# Patient Record
Sex: Female | Born: 1950 | Race: White | Hispanic: No | Marital: Married | Smoking: Never smoker
Health system: Southern US, Community
[De-identification: ages and names within clinical notes are randomized; demographics above are authoritative.]

## PROBLEM LIST (undated history)

## (undated) DIAGNOSIS — Z9889 Other specified postprocedural states: Secondary | ICD-10-CM

## (undated) DIAGNOSIS — N179 Acute kidney failure, unspecified: Secondary | ICD-10-CM

## (undated) DIAGNOSIS — I499 Cardiac arrhythmia, unspecified: Secondary | ICD-10-CM

## (undated) DIAGNOSIS — I4891 Unspecified atrial fibrillation: Secondary | ICD-10-CM

## (undated) DIAGNOSIS — I639 Cerebral infarction, unspecified: Secondary | ICD-10-CM

## (undated) DIAGNOSIS — I10 Essential (primary) hypertension: Secondary | ICD-10-CM

## (undated) DIAGNOSIS — Z950 Presence of cardiac pacemaker: Secondary | ICD-10-CM

## (undated) HISTORY — PX: GALLBLADDER SURGERY: SHX652

## (undated) HISTORY — PX: LEFT ATRIAL APPENDAGE OCCLUSION: EP1229

## (undated) HISTORY — PX: APPENDECTOMY: SHX54

## (undated) HISTORY — DX: Other specified postprocedural states: Z98.890

## (undated) HISTORY — PX: CATARACT EXTRACTION W/ INTRAOCULAR LENS IMPLANT: SHX1309

## (undated) HISTORY — PX: TONSILLECTOMY: SUR1361

## (undated) HISTORY — PX: OTHER SURGICAL HISTORY: SHX169

## (undated) HISTORY — DX: Acute kidney failure, unspecified: N17.9

## (undated) HISTORY — PX: CHOLECYSTECTOMY: SHX55

## (undated) HISTORY — PX: ABDOMINAL HYSTERECTOMY: SHX81

## (undated) HISTORY — PX: MITRAL VALVE ANNULOPLASTY: SHX2038

## (undated) HISTORY — PX: MAZE: SHX5063

## (undated) HISTORY — PX: EYE SURGERY: SHX253

---

## 1998-04-19 ENCOUNTER — Ambulatory Visit (HOSPITAL_BASED_OUTPATIENT_CLINIC_OR_DEPARTMENT_OTHER): Admission: RE | Admit: 1998-04-19 | Discharge: 1998-04-19 | Payer: Self-pay | Admitting: Orthopedic Surgery

## 2014-11-23 DIAGNOSIS — E785 Hyperlipidemia, unspecified: Secondary | ICD-10-CM | POA: Insufficient documentation

## 2014-11-23 DIAGNOSIS — I10 Essential (primary) hypertension: Secondary | ICD-10-CM | POA: Insufficient documentation

## 2014-11-23 HISTORY — DX: Essential (primary) hypertension: I10

## 2014-11-23 HISTORY — DX: Hyperlipidemia, unspecified: E78.5

## 2015-10-03 DIAGNOSIS — Z9889 Other specified postprocedural states: Secondary | ICD-10-CM | POA: Insufficient documentation

## 2015-10-03 DIAGNOSIS — I4892 Unspecified atrial flutter: Secondary | ICD-10-CM | POA: Insufficient documentation

## 2015-10-03 DIAGNOSIS — I480 Paroxysmal atrial fibrillation: Secondary | ICD-10-CM

## 2015-10-03 HISTORY — DX: Other specified postprocedural states: Z98.890

## 2015-10-03 HISTORY — DX: Unspecified atrial flutter: I48.92

## 2015-10-03 HISTORY — DX: Paroxysmal atrial fibrillation: I48.0

## 2016-09-24 DIAGNOSIS — I495 Sick sinus syndrome: Secondary | ICD-10-CM | POA: Insufficient documentation

## 2016-09-24 HISTORY — DX: Sick sinus syndrome: I49.5

## 2016-12-30 ENCOUNTER — Ambulatory Visit (INDEPENDENT_AMBULATORY_CARE_PROVIDER_SITE_OTHER): Payer: BC Managed Care – PPO | Admitting: Cardiology

## 2016-12-30 ENCOUNTER — Encounter: Payer: Self-pay | Admitting: Cardiology

## 2016-12-30 VITALS — BP 130/72 | HR 59 | Ht 60.0 in | Wt 169.0 lb

## 2016-12-30 DIAGNOSIS — I10 Essential (primary) hypertension: Secondary | ICD-10-CM | POA: Diagnosis not present

## 2016-12-30 DIAGNOSIS — Z9889 Other specified postprocedural states: Secondary | ICD-10-CM

## 2016-12-30 DIAGNOSIS — E785 Hyperlipidemia, unspecified: Secondary | ICD-10-CM | POA: Diagnosis not present

## 2016-12-30 DIAGNOSIS — I4892 Unspecified atrial flutter: Secondary | ICD-10-CM | POA: Diagnosis not present

## 2016-12-30 DIAGNOSIS — I480 Paroxysmal atrial fibrillation: Secondary | ICD-10-CM

## 2016-12-30 LAB — POCT INR: INR: 1.8

## 2016-12-30 MED ORDER — RIVAROXABAN 20 MG PO TABS: 20.0000 mg | ORAL_TABLET | Freq: Every day | ORAL | 2 refills | Status: DC

## 2016-12-30 NOTE — Patient Instructions (Signed)
Medication Instructions:   Your physician has recommended you make the following change in your medication:   STOP: Coumadin  START: Xarelto 20 mg once daily with food    Labwork:  Your physician recommends that you return for lab work in: Today   Testing/Procedures:  NONE  Follow-Up:  Your physician recommends that you schedule a follow-up appointment in: 6 months    Any Other Special Instructions Will Be Listed Below (If Applicable).     If you need a refill on your cardiac medications before your next appointment, please call your pharmacy.  '

## 2016-12-30 NOTE — Addendum Note (Signed)
Addended by: Domingo MadeiraMCCLEES, Rayshaun Needle N on: 12/30/2016 11:25 AM   Modules accepted: Orders

## 2016-12-30 NOTE — Addendum Note (Signed)
Addended by: Domingo MadeiraMCCLEES, RAVEN N on: 12/30/2016 11:45 AM   Modules accepted: Orders

## 2016-12-30 NOTE — Progress Notes (Signed)
Cardiology Office Note:    Date:  12/30/2016   ID:  Isabel Hardy, DOB [birth year], MRN [id number]  PCP:  Isabel Pearson, MD  Cardiologist:  Garwin Brothers, MD   Referring MD: Isabel Pearson, MD    ASSESSMENT:    1. Essential hypertension   2. Paroxysmal atrial fibrillation (HCC)   3. Paroxysmal atrial flutter (HCC)   4. Dyslipidemia   5. S/P MVR (mitral valve repair)    PLAN:    In order of problems listed above:  1. The patient has history of atrial flutter and has undergone ablation. She is on anticoagulation. I'm not surewhether she has an element of atrial fibrillation.She is followed by electrophysiologist for this. If she has isolated atrial flutter I asked her to talk to her electrophysiologist about going off from anticoagulation and taking full-strength aspirin. She will check this with her electrophysiologist in the next visit.I discussed with the patient atrial fibrillation, disease process. Management and therapy including rate and rhythm control, anticoagulation benefits and potential risks were discussed extensively with the patient. Patient had multiple questions which were answered to patient's satisfaction. 2. In the interim she wants to change medication to the new anticoagulants. We will check her pro time today and accordingly switch her to one of the newer anticoagulants. Benefits and potential this explained and she verbalized understanding. 3. Her blood pressure stable. Diet was discussed for obesity and dyslipidemia and she verbalized understanding. We will check her blood work today. Importance of regular exercise stressed. She'll be seen in follow-up appointment in 6 months or earlier if she has any concerns. 4. Her mitral valve replacement issues are stable.   Medication Adjustments/Labs and Tests Ordered: Current medicines are reviewed at length with the patient today.  Concerns regarding medicines are outlined above.  No orders of the defined types  were placed in this encounter.  No orders of the defined types were placed in this encounter.    History of Present Illness:    Isabel Hardy is a 66 y.o. female who is being seen today for the evaluation of atrial flutter and mitral valve repair at the request of Isabel Pearson, MD. Patient is a pleasant 66 year old female.she has been seen by me in the previous practice and wants her care to be transferred here. She denies any chest pain orthopnea or PND. He leads a sedentary lifestyle. At the time of my evaluation she is alert awake oriented and in no distress. She denies any history of palpitations. She is on anticoagulation with warfarinand wants to explore the newer medications for anticoagulation  Past Medical History:  Diagnosis Date  . H/O mitral valve repair     History reviewed. No pertinent surgical history.  Current Medications: Current Meds  Medication Sig  . ALPRAZolam (XANAX) 0.25 MG tablet Take 0.25 mg by mouth daily as needed.  Marland Kitchen atorvastatin (LIPITOR) 40 MG tablet Take 40 mg by mouth daily.  Marland Kitchen CALCIUM-VITAMIN D PO Take 1 capsule by mouth daily.  . Cholecalciferol (VITAMIN D) 2000 units tablet Take 2,000 Units by mouth daily.  . furosemide (LASIX) 20 MG tablet Take 20 mg by mouth daily.  . magnesium oxide (MAG-OX) 400 MG tablet Take 400 mg by mouth 2 (two) times daily.  . potassium chloride SA (K-DUR,KLOR-CON) 20 MEQ tablet Take 20 mEq by mouth daily.  . traMADol (ULTRAM) 50 MG tablet Take 50 mg by mouth daily as needed.  . valsartan-hydrochlorothiazide (DIOVAN-HCT) 320-25 MG tablet Take 1 tablet by mouth  daily.  . warfarin (COUMADIN) 1 MG tablet Take 3 mg by mouth daily.     Allergies:   Patient has no known allergies.   Social History   Social History  . Marital status: Married    Spouse name: N/A  . Number of children: N/A  . Years of education: N/A   Social History Main Topics  . Smoking status: Never Smoker  . Smokeless tobacco: Never Used  .  Alcohol use No  . Drug use: No  . Sexual activity: Not Asked   Other Topics Concern  . None   Social History Narrative  . None     Family History: The patient's family history includes Heart attack in her father.  ROS:   Please see the history of present illness.    All other systems reviewed and are negative.  EKGs/Labs/Other Studies Reviewed:    The following studies were reviewed today: I reviewed previous reports and discussed with the patient extensively my findings. Questions were answered to her satisfaction.   Recent Labs: No results found for requested labs within last 8760 hours.  Recent Lipid Panel No results found for: CHOL, TRIG, HDL, CHOLHDL, VLDL, LDLCALC, LDLDIRECT  Physical Exam:    VS:  BP 130/72   Pulse (!) 59   Ht 5' (1.524 m)   Wt 169 lb (76.7 kg)   SpO2 99%   BMI 33.01 kg/m     Wt Readings from Last 3 Encounters:  12/30/16 169 lb (76.7 kg)     GEN: Patient is in no acute distress HEENT: Normal NECK: No JVD; No carotid bruits LYMPHATICS: No lymphadenopathy CARDIAC: S1 S2 regular, 2/6 systolic murmur at the apex. RESPIRATORY:  Clear to auscultation without rales, wheezing or rhonchi  ABDOMEN: Soft, non-tender, non-distended MUSCULOSKELETAL:  No edema; No deformity  SKIN: Warm and dry NEUROLOGIC:  Alert and oriented x 3 PSYCHIATRIC:  Normal affect    Signed, Garwin Brothersajan R Calisha Tindel, MD  12/30/2016 11:00 AM    [facility]

## 2016-12-31 LAB — CBC WITH DIFFERENTIAL/PLATELET
BASOS ABS: 0 10*3/uL (ref 0.0–0.2)
Basos: 0 %
EOS (ABSOLUTE): 0.2 10*3/uL (ref 0.0–0.4)
EOS: 3 %
HEMATOCRIT: 43.5 % (ref 34.0–46.6)
Hemoglobin: 14.6 g/dL (ref 11.1–15.9)
Immature Grans (Abs): 0 10*3/uL (ref 0.0–0.1)
Immature Granulocytes: 0 %
LYMPHS ABS: 1.7 10*3/uL (ref 0.7–3.1)
Lymphs: 23 %
MCH: 30.9 pg (ref 26.6–33.0)
MCHC: 33.6 g/dL (ref 31.5–35.7)
MCV: 92 fL (ref 79–97)
MONOCYTES: 6 %
MONOS ABS: 0.4 10*3/uL (ref 0.1–0.9)
NEUTROS PCT: 68 %
Neutrophils Absolute: 5 10*3/uL (ref 1.4–7.0)
Platelets: 234 10*3/uL (ref 150–379)
RBC: 4.72 x10E6/uL (ref 3.77–5.28)
RDW: 14.4 % (ref 12.3–15.4)
WBC: 7.3 10*3/uL (ref 3.4–10.8)

## 2016-12-31 LAB — BASIC METABOLIC PANEL
BUN / CREAT RATIO: 15 (ref 12–28)
BUN: 21 mg/dL (ref 8–27)
CO2: 24 mmol/L (ref 20–29)
CREATININE: 1.36 mg/dL — AB (ref 0.57–1.00)
Calcium: 10.1 mg/dL (ref 8.7–10.3)
Chloride: 102 mmol/L (ref 96–106)
GFR, EST AFRICAN AMERICAN: 47 mL/min/{1.73_m2} — AB (ref >59)
GFR, EST NON AFRICAN AMERICAN: 41 mL/min/{1.73_m2} — AB (ref >59)
Glucose: 89 mg/dL (ref 65–99)
Potassium: 4 mmol/L (ref 3.5–5.2)
SODIUM: 148 mmol/L — AB (ref 134–144)

## 2016-12-31 LAB — HEPATIC FUNCTION PANEL
ALBUMIN: 4.8 g/dL (ref 3.6–4.8)
ALK PHOS: 78 IU/L (ref 39–117)
ALT: 22 IU/L (ref 0–32)
AST: 24 IU/L (ref 0–40)
Bilirubin Total: 1.3 mg/dL — ABNORMAL HIGH (ref 0.0–1.2)
Bilirubin, Direct: 0.35 mg/dL (ref 0.00–0.40)
Total Protein: 6.6 g/dL (ref 6.0–8.5)

## 2016-12-31 LAB — LIPID PANEL
CHOL/HDL RATIO: 2.7 ratio (ref 0.0–4.4)
CHOLESTEROL TOTAL: 151 mg/dL (ref 100–199)
HDL: 55 mg/dL (ref >39)
LDL Calculated: 76 mg/dL (ref 0–99)
TRIGLYCERIDES: 100 mg/dL (ref 0–149)
VLDL Cholesterol Cal: 20 mg/dL (ref 5–40)

## 2017-10-13 ENCOUNTER — Encounter: Payer: Self-pay | Admitting: Cardiology

## 2017-10-13 ENCOUNTER — Ambulatory Visit (INDEPENDENT_AMBULATORY_CARE_PROVIDER_SITE_OTHER): Payer: Medicare Other | Admitting: Cardiology

## 2017-10-13 VITALS — BP 124/68 | HR 57 | Ht 60.0 in | Wt 170.1 lb

## 2017-10-13 DIAGNOSIS — I10 Essential (primary) hypertension: Secondary | ICD-10-CM

## 2017-10-13 DIAGNOSIS — I4892 Unspecified atrial flutter: Secondary | ICD-10-CM | POA: Diagnosis not present

## 2017-10-13 DIAGNOSIS — I480 Paroxysmal atrial fibrillation: Secondary | ICD-10-CM

## 2017-10-13 DIAGNOSIS — E785 Hyperlipidemia, unspecified: Secondary | ICD-10-CM

## 2017-10-13 DIAGNOSIS — Z9889 Other specified postprocedural states: Secondary | ICD-10-CM | POA: Diagnosis not present

## 2017-10-13 NOTE — Progress Notes (Signed)
Cardiology Office Note:    Date:  10/13/2017   ID:  Isabel Hardy, DOB 12/23/1950, MRN [id number]  PCP:  Buckner MaltaBurgart, Jennifer, MD  Cardiologist:  Garwin Brothersajan R Revankar, MD   Referring MD: Marylynn PearsonBurkhart, Brian, MD    ASSESSMENT:    1. Essential hypertension   2. Paroxysmal atrial fibrillation (HCC)   3. Paroxysmal atrial flutter (HCC)   4. Dyslipidemia   5. S/P MVR (mitral valve repair)    PLAN:    In order of problems listed above:  1. Primary prevention stressed with the patient.  Importance of compliance with diet and medication stressed and she vocalized understanding. 2. I discussed with the patient atrial fibrillation, disease process. Management and therapy including rate and rhythm control, anticoagulation benefits and potential risks were discussed extensively with the patient. Patient had multiple questions which were answered to patient's satisfaction. 3. The patient is going to consider switching from warfarin to the newer anticoagulants.  She will let me know about this.  She is coming for an echocardiogram in the next few days.  EKG done today reveals sinus rhythm and nonspecific ST-T changes. 4. In view of her palpitations I will do a one-month event monitor.  She will be seen in follow-up appointment in 6 months or earlier if she has any concerns.  [facility] month of August.   Medication Adjustments/Labs and Tests Ordered: Current medicines are reviewed at length with the patient today.  Concerns regarding medicines are outlined above.  Orders Placed This Encounter  Procedures  . CARDIAC EVENT MONITOR  . EKG 12-Lead  . ECHOCARDIOGRAM COMPLETE   No orders of the defined types were placed in this encounter.    No chief complaint on file.    History of Present Illness:    Isabel Hardy is a 67 y.o. female.  She has past medical history of mitral valve repair which has been successful by follow-up  echocardiograms.  She also has history of paroxysmal atrial fibrillation for which reason she is on anticoagulation.  She denies any problems at this time and takes care of activities of daily living.  No chest pain orthopnea or PND.  She mentions to me that she uses an exercise bicycle without any symptoms.  She tells me that she saw her primary care doctor who felt she was in atrial fibrillation.  She does not remember having EKG be done.  Subsequently she says in the next visit her physician felt that she was in sinus rhythm and this was confirmed by the EKG.  Since then she has had no issues of palpitations.  At the time of my evaluation, the patient is alert awake oriented and in no distress.  Past Medical History:  Diagnosis Date  . H/O mitral valve repair     Past Surgical History:  Procedure Laterality Date  . ABDOMINAL HYSTERECTOMY    . APPENDECTOMY    . CESAREAN SECTION    . GALLBLADDER SURGERY    . open heart surgery      Current Medications: Current Meds  Medication Sig  . alendronate (FOSAMAX) 35 MG tablet Take 35 mg by mouth every 7 (seven) days. Take with a full glass of water on an empty stomach.  . ALPRAZolam (XANAX) 0.25 MG tablet Take 0.25 mg by mouth daily as needed.  Marland Kitchen. atorvastatin (LIPITOR) 40 MG tablet Take 40 mg by mouth daily.  Marland Kitchen. CALCIUM-VITAMIN D PO Take 1  capsule by mouth daily.  . Cholecalciferol (VITAMIN D) 2000 units tablet Take 2,000 Units by mouth daily.  . furosemide (LASIX) 20 MG tablet Take 20 mg by mouth daily.  . hydrochlorothiazide (HYDRODIURIL) 12.5 MG tablet Take 12.5 mg by mouth daily.  . magnesium oxide (MAG-OX) 400 MG tablet Take 400 mg by mouth 2 (two) times daily.  . potassium chloride SA (K-DUR,KLOR-CON) 20 MEQ tablet Take 20 mEq by mouth daily.  . risedronate (ACTONEL) 5 MG tablet Take 5 mg by mouth daily before breakfast. with water on empty stomach, nothing by mouth or lie down for next 30 minutes.  Marland Kitchen telmisartan-hydrochlorothiazide  (MICARDIS HCT) 40-12.5 MG tablet Take 1 tablet by mouth daily.  . traMADol (ULTRAM) 50 MG tablet Take 50 mg by mouth daily as needed.  . warfarin (COUMADIN) 3 MG tablet Take 3 mg by mouth every Monday, Wednesday, and Friday.     Allergies:   Patient has no known allergies.   Social History   Socioeconomic History  . Marital status: Married    Spouse name: Not on file  . Number of children: Not on file  . Years of education: Not on file  . Highest education level: Not on file  Occupational History  . Not on file  Social Needs  . Financial resource strain: Not on file  . Food insecurity:    Worry: Not on file    Inability: Not on file  . Transportation needs:    Medical: Not on file    Non-medical: Not on file  Tobacco Use  . Smoking status: Never Smoker  . Smokeless tobacco: Never Used  Substance and Sexual Activity  . Alcohol use: No  . Drug use: No  . Sexual activity: Not on file  Lifestyle  . Physical activity:    Days per week: Not on file    Minutes per session: Not on file  . Stress: Not on file  Relationships  . Social connections:    Talks on phone: Not on file    Gets together: Not on file    Attends religious service: Not on file    Active member of club or organization: Not on file    Attends meetings of clubs or organizations: Not on file    Relationship status: Not on file  Other Topics Concern  . Not on file  Social History Narrative  . Not on file     Family History: The patient's family history includes Heart attack in her father; Hypertension in her mother; Kidney disease in her mother.  ROS:   Please see the history of present illness.    All other systems reviewed and are negative.  EKGs/Labs/Other Studies Reviewed:    The following studies were reviewed today: I discussed my findings with the patient at extensive length.  EKG reveals sinus bradycardia and nonspecific ST-T changes.  Recent Labs: 12/30/2016: ALT 22; BUN 21; Creatinine,  Ser 1.36; Hemoglobin 14.6; Platelets 234; Potassium 4.0; Sodium 148  Recent Lipid Panel    Component Value Date/Time   CHOL 151 12/30/2016 1155   TRIG 100 12/30/2016 1155   HDL 55 12/30/2016 1155   CHOLHDL 2.7 12/30/2016 1155   LDLCALC 76 12/30/2016 1155    Physical Exam:    VS:  BP 124/68 (BP Location: Left Arm, Patient Position: Sitting, Cuff Size: Normal)   Pulse (!) 57   Ht 5' (1.524 m)   Wt 170 lb 1.9 oz (77.2 kg)   SpO2 98%  BMI 33.22 kg/m     Wt Readings from Last 3 Encounters:  10/13/17 170 lb 1.9 oz (77.2 kg)  12/30/16 169 lb (76.7 kg)     GEN: Patient is in no acute distress HEENT: Normal NECK: No JVD; No carotid bruits LYMPHATICS: No lymphadenopathy CARDIAC: Hear sounds regular, 2/6 systolic murmur at the apex. RESPIRATORY:  Clear to auscultation without rales, wheezing or rhonchi  ABDOMEN: Soft, non-tender, non-distended MUSCULOSKELETAL:  No edema; No deformity  SKIN: Warm and dry NEUROLOGIC:  Alert and oriented x 3 PSYCHIATRIC:  Normal affect   Signed, Garwin Brothers, MD  10/13/2017 10:05 AM    [facility]

## 2017-10-13 NOTE — Patient Instructions (Signed)
Medication Instructions:  Your physician recommends that you continue on your current medications as directed. Please refer to the Current Medication list given to you today.  Labwork: None  Testing/Procedures: Your physician has requested that you have an echocardiogram. Echocardiography is a painless test that uses sound waves to create images of your heart. It provides your doctor with information about the size and shape of your heart and how well your heart's chambers and valves are working. This procedure takes approximately one hour. There are no restrictions for this procedure.  Follow-Up: Your physician recommends that you schedule a follow-up appointment in: 6 months  Any Other Special Instructions Will Be Listed Below (If Applicable).     If you need a refill on your cardiac medications before your next appointment, please call your pharmacy.   [facility]  Kedar Sedano A, RN, BSN  

## 2017-10-30 ENCOUNTER — Encounter: Payer: Self-pay | Admitting: *Deleted

## 2017-10-30 ENCOUNTER — Other Ambulatory Visit: Payer: Self-pay

## 2017-10-30 ENCOUNTER — Ambulatory Visit (INDEPENDENT_AMBULATORY_CARE_PROVIDER_SITE_OTHER): Payer: Medicare Other

## 2017-10-30 DIAGNOSIS — Z9889 Other specified postprocedural states: Secondary | ICD-10-CM | POA: Diagnosis not present

## 2017-10-30 DIAGNOSIS — I361 Nonrheumatic tricuspid (valve) insufficiency: Secondary | ICD-10-CM | POA: Diagnosis not present

## 2017-10-30 DIAGNOSIS — Z8679 Personal history of other diseases of the circulatory system: Secondary | ICD-10-CM | POA: Insufficient documentation

## 2017-10-30 HISTORY — DX: Personal history of other diseases of the circulatory system: Z86.79

## 2017-10-30 NOTE — Progress Notes (Signed)
Complete echocardiogram has been performed.  Jimmy Jailen Lung RDCS 

## 2017-11-04 ENCOUNTER — Ambulatory Visit (INDEPENDENT_AMBULATORY_CARE_PROVIDER_SITE_OTHER): Payer: Medicare Other

## 2017-11-04 DIAGNOSIS — I480 Paroxysmal atrial fibrillation: Secondary | ICD-10-CM | POA: Diagnosis not present

## 2017-12-02 ENCOUNTER — Telehealth: Payer: Self-pay | Admitting: Cardiology

## 2017-12-02 NOTE — Telephone Encounter (Signed)
Called PCP office with no answer. Dr. Tomie Chinaevankar is out of the office until next week; will ask his preference once I have more detail from PCP.

## 2017-12-02 NOTE — Telephone Encounter (Signed)
Wants to know if RRR would prefer pt be on eliquis or xarelto

## 2017-12-08 ENCOUNTER — Other Ambulatory Visit: Payer: Self-pay

## 2017-12-08 NOTE — Telephone Encounter (Signed)
Letter has been faxed to PCP and voicemail was left with PCP informing of Dr. Kem Parkinsonevankar's recommendation.

## 2017-12-08 NOTE — Telephone Encounter (Signed)
Followed up with patient, per the patient she was instructed to ask our office regarding which medication should be chosen as the PCP did not feel confident in choosing. Please advise so that I may call the PCP office.

## 2017-12-09 ENCOUNTER — Telehealth: Payer: Self-pay | Admitting: *Deleted

## 2017-12-09 NOTE — Telephone Encounter (Signed)
Faxed final Monitor results to Dr. Excell SeltzerBaker, fax #[phone]. Contact person is [place] [phone].

## 2018-09-06 ENCOUNTER — Telehealth: Payer: Self-pay | Admitting: *Deleted

## 2018-09-06 NOTE — Telephone Encounter (Signed)
error 

## 2018-09-07 ENCOUNTER — Telehealth: Payer: Self-pay

## 2018-09-07 NOTE — Telephone Encounter (Signed)
Isabel Hardy faxed message about patient needing to have a CT Myelogram Lumbar Spine. They have to inject her with dye prior to the CT. Ortho want to know if you are okay with Isabel Hardy holding her Eliquis 48 hours prior to having this done. The message does not mention that she has been set up with a date for this yet. Please advise.

## 2018-09-07 NOTE — Telephone Encounter (Signed)
Needs tele appt with me. More than 44mo past appt!!

## 2018-09-07 NOTE — Telephone Encounter (Signed)
RN called patient and received phone consent for telemedicine visit. Patient instructed to have home BP cuff available, weigh herself and all medication out for reconciliation/refills. Patient scheduled for virtual visit and had no further questions. Scheduled for 09/09/18.

## 2018-09-08 ENCOUNTER — Telehealth: Payer: Self-pay | Admitting: Cardiology

## 2018-09-08 NOTE — Telephone Encounter (Signed)
Virtual Visit Pre-Appointment Phone Call  "(Name), I am calling you today to discuss your upcoming appointment. We are currently trying to limit exposure to the virus that causes COVID-19 by seeing patients at home rather than in the office."  1. "What is the BEST phone number to call the day of the visit?" - include this in appointment notes  2. Do you have or have access to (through a family member/friend) a smartphone with video capability that we can use for your visit?" a. If yes - list this number in appt notes as cell (if different from BEST phone #) and list the appointment type as a VIDEO visit in appointment notes b. If no - list the appointment type as a PHONE visit in appointment notes  Confirm consent - "In the setting of the current Covid19 crisis, you are scheduled for a (phone or video) visit with your provider on (date) at (time).  Just as we do with many in-office visits, in order for you to participate in this visit, we must obtain consent.  If you'd like, I can send this to your mychart (if signed up) or email for you to review.  Otherwise, I can obtain your verbal consent now.  All virtual visits are billed to your insurance company just like a normal visit would be.  By agreeing to a virtual visit, we'd like you to understand that the technology does not allow for your provider to perform an examination, and thus may limit your provider's ability to fully assess your condition. If your provider identifies any concerns that need to be evaluated in person, we will make arrangements to do so.  Finally, though the technology is pretty good, we cannot assure that it will always work on either your or our end, and in the setting of a video visit, we may have to convert it to a phone-only visit.  In either situation, we cannot ensure that we have a secure connection.  Are you willing to proceed?" STAFF: Did the patient verbally acknowledge consent to telehealth visit? Document  YES/NO here: YES 3. Advise patient to be prepared - "Two hours prior to your appointment, go ahead and check your blood pressure, pulse, oxygen saturation, and your weight (if you have the equipment to check those) and write them all down. When your visit starts, your provider will ask you for this information. If you have an Apple Watch or Kardia device, please plan to have heart rate information ready on the day of your appointment. Please have a pen and paper handy nearby the day of the visit as well."  4. Give patient instructions for MyChart download to smartphone OR Doximity/Doxy.me as below if video visit (depending on what platform provider is using)  5. Inform patient they will receive a phone call 15 minutes prior to their appointment time (may be from unknown caller ID) so they should be prepared to answer    TELEPHONE CALL NOTE  Isabel Hardy has been deemed a candidate for a follow-up tele-health visit to limit community exposure during the Covid-19 pandemic. I spoke with the patient via phone to ensure availability of phone/video source, confirm preferred email & phone number, and discuss instructions and expectations.  I reminded Isabel Hardy to be prepared with any vital sign and/or heart rhythm information that could potentially be obtained via home monitoring, at the time of her visit. I reminded Isabel Hardy to expect a phone call prior to her visit.  Minette Brine 09/08/2018 2:15 PM   INSTRUCTIONS FOR DOWNLOADING THE MYCHART APP TO SMARTPHONE  - The patient must first make sure to have activated MyChart and know their login information - If Apple, go to [employer] and type in MyChart in the search bar and download the app. If Android, ask patient to go to [employer] and type in [place] in the search bar and download the app. The app is free but as with any other app downloads, their phone may require them to verify saved payment information or Apple/Android  password.  - The patient will need to then log into the app with their MyChart username and password, and select  as their healthcare provider to link the account. When it is time for your visit, go to the MyChart app, find appointments, and click Begin Video Visit. Be sure to Select Allow for your device to access the Microphone and Camera for your visit. You will then be connected, and your provider will be with you shortly.  **If they have any issues connecting, or need assistance please contact MyChart service desk [phone])**  **If using a computer, in order to ensure the best quality for their visit they will need to use either of the following Internet Browsers: [employer] or Google Chrome**  IF USING DOXIMITY or DOXY.ME - The patient will receive a link just prior to their visit by text.     FULL LENGTH CONSENT FOR TELE-HEALTH VISIT   I hereby voluntarily request, consent and authorize [facility] and its employed or contracted physicians, physician assistants, nurse practitioners or other licensed health care professionals (the Practitioner), to provide me with telemedicine health care services (the Services") as deemed necessary by the treating Practitioner. I acknowledge and consent to receive the Services by the Practitioner via telemedicine. I understand that the telemedicine visit will involve communicating with the Practitioner through live audiovisual communication technology and the disclosure of certain medical information by electronic transmission. I acknowledge that I have been given the opportunity to request an in-person assessment or other available alternative prior to the telemedicine visit and am voluntarily participating in the telemedicine visit.  I understand that I have the right to withhold or withdraw my consent to the use of telemedicine in the course of my care at any time, without affecting my right to future care or treatment,  and that the Practitioner or I may terminate the telemedicine visit at any time. I understand that I have the right to inspect all information obtained and/or recorded in the course of the telemedicine visit and may receive copies of available information for a reasonable fee.  I understand that some of the potential risks of receiving the Services via telemedicine include:   Delay or interruption in medical evaluation due to technological equipment failure or disruption;  Information transmitted may not be sufficient (e.g. poor resolution of images) to allow for appropriate medical decision making by the Practitioner; and/or   In rare instances, security protocols could fail, causing a breach of personal health information.  Furthermore, I acknowledge that it is my responsibility to provide information about my medical history, conditions and care that is complete and accurate to the best of my ability. I acknowledge that Practitioner's advice, recommendations, and/or decision may be based on factors not within their control, such as incomplete or inaccurate data provided by me or distortions of diagnostic images or specimens that may result from electronic transmissions. I understand that the  practice of medicine is not an Chief Strategy Officer and that Practitioner makes no warranties or guarantees regarding treatment outcomes. I acknowledge that I will receive a copy of this consent concurrently upon execution via email to the email address I last provided but may also request a printed copy by calling the office of [place].    I understand that my insurance will be billed for this visit.   I have read or had this consent read to me.  I understand the contents of this consent, which adequately explains the benefits and risks of the Services being provided via telemedicine.   I have been provided ample opportunity to ask questions regarding this consent and the Services and have had my questions  answered to my satisfaction.  I give my informed consent for the services to be provided through the use of telemedicine in my medical care  By participating in this telemedicine visit I agree to the above.

## 2018-09-09 ENCOUNTER — Encounter: Payer: Self-pay | Admitting: Cardiology

## 2018-09-09 ENCOUNTER — Telehealth (INDEPENDENT_AMBULATORY_CARE_PROVIDER_SITE_OTHER): Payer: Medicare Other | Admitting: Cardiology

## 2018-09-09 ENCOUNTER — Other Ambulatory Visit: Payer: Self-pay

## 2018-09-09 VITALS — BP 135/81 | HR 61 | Ht 60.0 in | Wt 183.0 lb

## 2018-09-09 DIAGNOSIS — Z9889 Other specified postprocedural states: Secondary | ICD-10-CM

## 2018-09-09 DIAGNOSIS — I480 Paroxysmal atrial fibrillation: Secondary | ICD-10-CM

## 2018-09-09 DIAGNOSIS — I4892 Unspecified atrial flutter: Secondary | ICD-10-CM

## 2018-09-09 DIAGNOSIS — E785 Hyperlipidemia, unspecified: Secondary | ICD-10-CM

## 2018-09-09 DIAGNOSIS — I10 Essential (primary) hypertension: Secondary | ICD-10-CM

## 2018-09-09 NOTE — Patient Instructions (Addendum)
Per patient request and Dr.Revankar, a copy of today's visit note was sent to Dr. Garnette Gunner for review.     Medication Instructions:  Your physician recommends that you continue on your current medications as directed. Please refer to the Current Medication list given to you today.  If you need a refill on your cardiac medications before your next appointment, please call your pharmacy.   Lab work: NONE If you have labs (blood work) drawn today and your tests are completely normal, you will receive your results only by: Marland Kitchen MyChart Message (if you have MyChart) OR . A paper copy in the mail If you have any lab test that is abnormal or we need to change your treatment, we will call you to review the results.  Testing/Procedures: NONE  Follow-Up: At [facility], you and your health needs are our priority.  As part of our continuing mission to provide you with exceptional heart care, we have created designated Provider Care Teams.  These Care Teams include your primary Cardiologist (physician) and Advanced Practice Providers (APPs -  Physician Assistants and Nurse Practitioners) who all work together to provide you with the care you need, when you need it. You will need a follow up appointment in 4 months.

## 2018-09-09 NOTE — Progress Notes (Signed)
Virtual Visit via Video Note   This visit type was conducted due to national recommendations for restrictions regarding the COVID-19 Pandemic (e.g. social distancing) in an effort to limit this patient's exposure and mitigate transmission in our community.  Due to her co-morbid illnesses, this patient is at least at moderate risk for complications without adequate follow up.  This format is felt to be most appropriate for this patient at this time.  All issues noted in this document were discussed and addressed.  A limited physical exam was performed with this format.  Please refer to the patient's chart for her consent to telehealth for [facility].   Date:  09/09/2018   ID:  Isabel Hardy, DOB [birth year], MRN [id number]  Patient Location: Home Provider Location: Home  PCP:  Buckner Malta, MD  Cardiologist:  No primary care provider on file.  Electrophysiologist:  None   Evaluation Performed:  Follow-Up Visit  Chief Complaint: Atrial fibrillation  History of Present Illness:    Isabel Hardy is a 68 y.o. female with past medical history of atrial fibrillation and she also has a pacemaker.  She is now planning to undergo a myelogram it appears like.  She is considering getting this done and also has questions about anticoagulation.  No chest pain orthopnea or PND.  She takes care of activities of daily living.  Her daughter is with her during this visit on the phone by video conference.  At the time of my evaluation, the patient is alert awake oriented and in no distress.  The patient does not have symptoms concerning for COVID-19 infection (fever, chills, cough, or new shortness of breath).    Past Medical History:  Diagnosis Date  . H/O mitral valve repair    Past Surgical History:  Procedure Laterality Date  . ABDOMINAL HYSTERECTOMY    . APPENDECTOMY    . CESAREAN SECTION    . GALLBLADDER SURGERY    . open heart surgery       Current Meds  Medication Sig   . ALPRAZolam (XANAX) 0.25 MG tablet Take 0.25 mg by mouth daily as needed.  Marland Kitchen apixaban (ELIQUIS) 5 MG TABS tablet Take 1 tablet by mouth 2 (two) times a day.  Marland Kitchen atorvastatin (LIPITOR) 40 MG tablet Take 40 mg by mouth daily.  Marland Kitchen CALCIUM-VITAMIN D PO Take 1 capsule by mouth daily.  . Cholecalciferol (VITAMIN D) 2000 units tablet Take 2,000 Units by mouth daily.  . furosemide (LASIX) 20 MG tablet Take 20 mg by mouth daily.  Marland Kitchen gabapentin (NEURONTIN) 300 MG capsule Take 300 mg by mouth 3 (three) times daily.  . hydrochlorothiazide (HYDRODIURIL) 12.5 MG tablet Take 12.5 mg by mouth daily.  . Magnesium Oxide 250 MG TABS Take 250 mg by mouth 2 (two) times daily.   . metoprolol tartrate (LOPRESSOR) 25 MG tablet Take 1 tablet by mouth 2 (two) times a day.  . potassium chloride SA (K-DUR,KLOR-CON) 20 MEQ tablet Take 20 mEq by mouth 2 (two) times daily.   . risedronate (ACTONEL) 5 MG tablet Take 5 mg by mouth daily before breakfast. with water on empty stomach, nothing by mouth or lie down for next 30 minutes.  Marland Kitchen telmisartan-hydrochlorothiazide (MICARDIS HCT) 40-12.5 MG tablet Take 1 tablet by mouth daily.  . traZODone (DESYREL) 50 MG tablet Take 1 tablet by mouth at bedtime.     Allergies:   Patient has no known allergies.   Social History   Tobacco Use  . Smoking status:  Never Smoker  . Smokeless tobacco: Never Used  Substance Use Topics  . Alcohol use: No  . Drug use: No     Family Hx: The patient's family history includes Heart attack in her father; Hypertension in her mother; Kidney disease in her mother.  ROS:   Please see the history of present illness.    As mentioned above All other systems reviewed and are negative.   Prior CV studies:   The following studies were reviewed today:  Results of the event monitor and echocardiogram were discussed with the patient.  With the patient when I call  Labs/Other Tests and Data Reviewed:    EKG:  I reviewed the telemetry report and the  event monitor done in the past with the patient at length  Recent Labs: No results found for requested labs within last 8760 hours.   Recent Lipid Panel Lab Results  Component Value Date/Time   CHOL 151 12/30/2016 11:55 AM   TRIG 100 12/30/2016 11:55 AM   HDL 55 12/30/2016 11:55 AM   CHOLHDL 2.7 12/30/2016 11:55 AM   LDLCALC 76 12/30/2016 11:55 AM    Wt Readings from Last 3 Encounters:  09/09/18 183 lb (83 kg)  10/13/17 170 lb 1.9 oz (77.2 kg)  12/30/16 169 lb (76.7 kg)     Objective:    Vital Signs:  BP 135/81 (BP Location: Left Arm, Patient Position: Sitting, Cuff Size: Normal)   Pulse 61   Ht 5' (1.524 m)   Wt 183 lb (83 kg)   BMI 35.74 kg/m    VITAL SIGNS:  reviewed  ASSESSMENT & PLAN:    1. Atrial fibrillation/flutter:I discussed with the patient atrial fibrillation, disease process. Management and therapy including rate and rhythm control, anticoagulation benefits and potential risks were discussed extensively with the patient. Patient had multiple questions which were answered to patient's satisfaction.  I had a lengthy conversation with the patient about anticoagulation.  Her heart rates are well controlled.  I told her that she has an option of stopping anticoagulation for a period Of time specified by the proceduralist which is typically about 2 to 5 days before the procedure.  They will also let her know when to resume it after the procedure.  The risks of thromboembolism were explained and they are not major.  She understands.  Options of Lovenox bridging was also offered to the patient.  She declined at this time.  I also told her that if she wants to have a second opinion about this she can call her electrophysiologist [facility] and she understood and her daughter did 2. 2. Essential hypertension: Blood pressure is under good control and she will continue beta-blockers and other antihypertensive medications 3. Patient will be seen in follow-up appointment  in 4 months or earlier if the patient has any concerns   COVID-19 Education: The signs and symptoms of COVID-19 were discussed with the patient and how to seek care for testing (follow up with PCP or arrange E-visit).  The importance of social distancing was discussed today.  Time:   Today, I have spent 15 minutes with the patient with telehealth technology discussing the above problems.     Medication Adjustments/Labs and Tests Ordered: Current medicines are reviewed at length with the patient today.  Concerns regarding medicines are outlined above.   Tests Ordered: No orders of the defined types were placed in this encounter.   Medication Changes: No orders of the defined types were placed in  this encounter.   Disposition:  Follow up in 4 month(s)  Signed, Garwin Brothers, MD  09/09/2018 10:36 AM    [facility]

## 2018-12-31 DIAGNOSIS — M25559 Pain in unspecified hip: Secondary | ICD-10-CM

## 2018-12-31 HISTORY — DX: Pain in unspecified hip: M25.559

## 2019-01-05 ENCOUNTER — Other Ambulatory Visit: Payer: Self-pay | Admitting: Neurosurgery

## 2019-01-06 ENCOUNTER — Other Ambulatory Visit: Payer: Self-pay | Admitting: Neurosurgery

## 2019-01-10 ENCOUNTER — Telehealth: Payer: Self-pay

## 2019-01-10 DIAGNOSIS — Z01818 Encounter for other preprocedural examination: Secondary | ICD-10-CM

## 2019-01-10 NOTE — Telephone Encounter (Signed)
Per Dr. Docia Furl, patient needs lexi scan for cardiac clearance of surgery. Patient scheduled tomorrow at [facility] office at 1130. RN went over med instructions with pt. She is currently scheduled for back surgery on 01/19/19.

## 2019-01-11 ENCOUNTER — Ambulatory Visit (INDEPENDENT_AMBULATORY_CARE_PROVIDER_SITE_OTHER): Payer: Medicare Other

## 2019-01-11 ENCOUNTER — Other Ambulatory Visit: Payer: Self-pay

## 2019-01-11 DIAGNOSIS — Z01818 Encounter for other preprocedural examination: Secondary | ICD-10-CM

## 2019-01-11 DIAGNOSIS — Z01810 Encounter for preprocedural cardiovascular examination: Secondary | ICD-10-CM

## 2019-01-11 MED ORDER — TECHNETIUM TC 99M TETROFOSMIN IV KIT
31.7000 | PACK | Freq: Once | INTRAVENOUS | Status: AC | PRN
  Administered 2019-01-11: 31.7 via INTRAVENOUS

## 2019-01-11 MED ORDER — TECHNETIUM TC 99M TETROFOSMIN IV KIT
10.4000 | PACK | Freq: Once | INTRAVENOUS | Status: AC | PRN
  Administered 2019-01-11: 10.4 via INTRAVENOUS

## 2019-01-11 MED ORDER — REGADENOSON 0.4 MG/5ML IV SOLN
0.4000 mg | Freq: Once | INTRAVENOUS | Status: AC
  Administered 2019-01-11: 0.4 mg via INTRAVENOUS

## 2019-01-12 LAB — MYOCARDIAL PERFUSION IMAGING
LV dias vol: 41 mL (ref 46–106)
LV sys vol: 11 mL
Peak HR: 80 {beats}/min
Rest HR: 68 {beats}/min
SDS: 2
SRS: 9
SSS: 11
TID: 0.84

## 2019-01-14 ENCOUNTER — Telehealth: Payer: Self-pay

## 2019-01-14 NOTE — Telephone Encounter (Signed)
Patient also informed of lexi results also. Copy sent to Dr. Jeryl Columbia office.

## 2019-01-14 NOTE — Telephone Encounter (Signed)
Informed patient that her cardiac clearance was approved and RN faxed copy to [facility]. No further questions at this time. 

## 2019-01-14 NOTE — Pre-Procedure Instructions (Signed)
[place], [place] [address] Phone: [phone] Fax: [phone]      Your procedure is scheduled on  01-19-19  Report to [facility] Main Entrance "A" at 1145 A.M., and check in at the Admitting office.  Call this number if you have problems the morning of surgery:  [phone]  Call [phone] if you have any questions prior to your surgery date Monday-Friday 8am-4pm    Remember:  Do not eat or drink after midnight the night before your surgery  Take these medicines the morning of surgery with A SIP OF WATER : metoprolol tartrate (LOPRESSOR) acetaminophen (TYLENOL) as needed ALPRAZolam (XANAX)as needed gabapentin (NEURONTIN as needed traMADol (ULTRAM) as needed  Follow your surgeon's instructions on when to stop ELIQUIS.  If no instructions were given by your surgeon then you will need to call the office to get those instructions.    7 days prior to surgery STOP taking any Aspirin (unless otherwise instructed by your surgeon), Aleve, Naproxen, Ibuprofen, Motrin, Advil, Goody's, BC's, all herbal medications, fish oil, and all vitamins.    The Morning of Surgery  Do not wear jewelry, make-up or nail polish.  Do not wear lotions, powders, or perfumes, or deodorant  Do not shave 48 hours prior to surgery.   Do not bring valuables to the hospital.  [facility] is not responsible for any belongings or valuables.  If you are a smoker, DO NOT Smoke 24 hours prior to surgery IF you wear a CPAP at night please bring your mask, tubing, and machine the morning of surgery   Remember that you must have someone to transport you home after your surgery, and remain with you for 24 hours if you are discharged the same day.   Contacts, glasses, hearing aids, dentures or bridgework may not be worn into surgery.    Leave your suitcase in the car.  After surgery it may be brought to your room.  For patients admitted to the  hospital, discharge time will be determined by your treatment team.  Patients discharged the day of surgery will not be allowed to drive home.    Special instructions:   [facility]- Preparing For Surgery  Before surgery, you can play an important role. Because skin is not sterile, your skin needs to be as free of germs as possible. You can reduce the number of germs on your skin by washing with CHG (chlorahexidine gluconate) Soap before surgery.  CHG is an antiseptic cleaner which kills germs and bonds with the skin to continue killing germs even after washing.    Oral Hygiene is also important to reduce your risk of infection.  Remember - BRUSH YOUR TEETH THE MORNING OF SURGERY WITH YOUR REGULAR TOOTHPASTE  Please do not use if you have an allergy to CHG or antibacterial soaps. If your skin becomes reddened/irritated stop using the CHG.  Do not shave (including legs and underarms) for at least 48 hours prior to first CHG shower. It is OK to shave your face.  Please follow these instructions carefully.   1. Shower the NIGHT BEFORE SURGERY and the MORNING OF SURGERY with CHG Soap.   2. If you chose to wash your hair, wash your hair first as usual with your normal shampoo.  3. After you shampoo, rinse your hair and body thoroughly to remove the shampoo.  4. Use CHG as you would any other liquid soap. You can apply CHG  directly to the skin and wash gently with a scrungie or a clean washcloth.   5. Apply the CHG Soap to your body ONLY FROM THE NECK DOWN.  Do not use on open wounds or open sores. Avoid contact with your eyes, ears, mouth and genitals (private parts). Wash Face and genitals (private parts)  with your normal soap.   6. Wash thoroughly, paying special attention to the area where your surgery will be performed.  7. Thoroughly rinse your body with warm water from the neck down.  8. DO NOT shower/wash with your normal soap after using and rinsing off the CHG Soap.  9. Pat  yourself dry with a CLEAN TOWEL.  10. Wear CLEAN PAJAMAS to bed the night before surgery, wear comfortable clothes the morning of surgery  11. Place CLEAN SHEETS on your bed the night of your first shower and DO NOT SLEEP WITH PETS.    Day of Surgery:  Do not apply any deodorants/lotions. Please shower the morning of surgery with the CHG soap  Please wear clean clothes to the hospital/surgery center.   Remember to brush your teeth WITH YOUR REGULAR TOOTHPASTE.   Please read over the  fact sheets that you were given.

## 2019-01-14 NOTE — Telephone Encounter (Signed)
Informed patient that her cardiac clearance was approved and RN faxed copy to [facility]. No further questions at this time.

## 2019-01-17 ENCOUNTER — Other Ambulatory Visit (HOSPITAL_COMMUNITY)
Admission: RE | Admit: 2019-01-17 | Discharge: 2019-01-17 | Disposition: A | Discharge status: Home / Self Care | Payer: Medicare Other | Source: Ambulatory Visit | Attending: Neurosurgery | Admitting: Neurosurgery

## 2019-01-17 ENCOUNTER — Other Ambulatory Visit: Payer: Self-pay

## 2019-01-17 ENCOUNTER — Encounter (HOSPITAL_COMMUNITY): Payer: Self-pay

## 2019-01-17 ENCOUNTER — Encounter (HOSPITAL_COMMUNITY)
Admission: RE | Admit: 2019-01-17 | Discharge: 2019-01-17 | Disposition: A | Discharge status: Home / Self Care | Payer: Medicare Other | Source: Ambulatory Visit | Attending: Neurosurgery | Admitting: Neurosurgery

## 2019-01-17 ENCOUNTER — Telehealth: Payer: Medicare Other | Admitting: Cardiology

## 2019-01-17 DIAGNOSIS — Z01818 Encounter for other preprocedural examination: Secondary | ICD-10-CM | POA: Diagnosis present

## 2019-01-17 DIAGNOSIS — M48062 Spinal stenosis, lumbar region with neurogenic claudication: Secondary | ICD-10-CM | POA: Diagnosis not present

## 2019-01-17 DIAGNOSIS — Z01810 Encounter for preprocedural cardiovascular examination: Secondary | ICD-10-CM | POA: Diagnosis not present

## 2019-01-17 DIAGNOSIS — I491 Atrial premature depolarization: Secondary | ICD-10-CM | POA: Diagnosis not present

## 2019-01-17 DIAGNOSIS — I4891 Unspecified atrial fibrillation: Secondary | ICD-10-CM | POA: Diagnosis not present

## 2019-01-17 DIAGNOSIS — Z01812 Encounter for preprocedural laboratory examination: Secondary | ICD-10-CM | POA: Insufficient documentation

## 2019-01-17 HISTORY — DX: Unspecified atrial fibrillation: I48.91

## 2019-01-17 HISTORY — DX: Presence of cardiac pacemaker: Z95.0

## 2019-01-17 HISTORY — DX: Cerebral infarction, unspecified: I63.9

## 2019-01-17 HISTORY — DX: Cardiac arrhythmia, unspecified: I49.9

## 2019-01-17 HISTORY — DX: Essential (primary) hypertension: I10

## 2019-01-17 LAB — SURGICAL PCR SCREEN
MRSA, PCR: NEGATIVE
Staphylococcus aureus: NEGATIVE

## 2019-01-17 LAB — BASIC METABOLIC PANEL
Anion gap: 12 (ref 5–15)
BUN: 22 mg/dL (ref 8–23)
CO2: 27 mmol/L (ref 22–32)
Calcium: 9.6 mg/dL (ref 8.9–10.3)
Chloride: 104 mmol/L (ref 98–111)
Creatinine, Ser: 1.66 mg/dL — ABNORMAL HIGH (ref 0.44–1.00)
GFR calc Af Amer: 37 mL/min — ABNORMAL LOW (ref >60)
GFR calc non Af Amer: 32 mL/min — ABNORMAL LOW (ref >60)
Glucose, Bld: 131 mg/dL — ABNORMAL HIGH (ref 70–99)
Potassium: 3.9 mmol/L (ref 3.5–5.1)
Sodium: 143 mmol/L (ref 135–145)

## 2019-01-17 LAB — CBC
HCT: 41.9 % (ref 36.0–46.0)
Hemoglobin: 14.7 g/dL (ref 12.0–15.0)
MCH: 31.7 pg (ref 26.0–34.0)
MCHC: 35.1 g/dL (ref 30.0–36.0)
MCV: 90.5 fL (ref 80.0–100.0)
Platelets: 221 10*3/uL (ref 150–400)
RBC: 4.63 MIL/uL (ref 3.87–5.11)
RDW: 14 % (ref 11.5–15.5)
WBC: 7.6 10*3/uL (ref 4.0–10.5)
nRBC: 0 % (ref 0.0–0.2)

## 2019-01-17 LAB — PROTIME-INR
INR: 1.5 — ABNORMAL HIGH (ref 0.8–1.2)
Prothrombin Time: 17.9 seconds — ABNORMAL HIGH (ref 11.4–15.2)

## 2019-01-17 LAB — SARS CORONAVIRUS 2 (TAT 6-24 HRS): SARS Coronavirus 2: NEGATIVE

## 2019-01-17 NOTE — Progress Notes (Addendum)
PCP - Dr. Serita Grammes Cardiologist - Dr.  Sunny Schlein Revankar Electrophysiologist - Dr. Lovena Neighbours [facility])  PPM/ICD - Yes Device Orders - Faxed to device clinic  Rep Notified- Yes (Medtronic),e-mail sent  Chest x-ray - N/A EKG - 01/17/2019 Stress Test - 01/11/2019 ECHO - 10/30/2017 Cardiac Cath - 03/30/15  Sleep Study - denies CPAP - N/A  Blood Thinner Instructions: Eliquis - LD: 01/15/2019 per patient Aspirin Instructions: N/A  ERAS Protcol - No PRE-SURGERY Ensure - N/A  COVID TEST-  Scheduled for today 01/17/2019  Anesthesia review: YES, cardiac hx, cardiac clearance, Device/Physicians order form faxed  Patient denies shortness of breath, fever, cough and chest pain at PAT appointment  Patient verbalized understanding of instructions that were given to them at the PAT appointment. Patient was also instructed that they will need to review over the PAT instructions again at home before surgery.

## 2019-01-18 NOTE — Progress Notes (Signed)
Anesthesia Chart Review:  Case: [id number] Date/Time: 01/19/19 1339   Procedure: LUMBAR LAMINECTOMY AND FORAMINOTOMY LUMBAR 3- LUMBAR 4, LUMBAR 4- LUMBAR 5 (N/A ) - LUMBAR LAMINECTOMY AND FORAMINOTOMY LUMBAR 3- LUMBAR 4, LUMBAR 4- LUMBAR 5   Anesthesia type: General   Pre-op diagnosis: SPINAL STENOSIS, LUMBAR REGION WITH NEUROGENIC CLAUDICATION   [facility] ROOM [facility] / [facility] OR   Surgeon: Tressie Stalker, MD      DISCUSSION: Patient is a 68 year old female scheduled for the above procedure.  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 Sindy Guadeloupe), Sinus node dysfunction (s/p implantation of a dual chamber Medtronic PPM 03/17/18), HTN, TIA (~ 2005). BMI is consistent with obesity.   Following recent low risk stress test, she was given cardiac clearance for surgery by Dr. Tomie China ("low risk"). He discussed perioperative anticoagulation options at her 09/09/18 visit. She declined Lovenox bridge while Eliquis on hold for surgery. He Last Eliquis 01/15/19.   Preoperative labs show PT 17.9, INR 1.5. Will not plan to repeat since Eliquis already on hold. BUN 22. Cr 1.66 (previously 1.20 03/17/18; primarily ~ 1.2-1.40 since 2017). Meds include Lasix 20 mg daily, HCTZ 12.5 mg daily, and Micardis HCT daily which will be held on the day of surgery. Consider post-operative BMET.    COVID-19 test 01/17/19 negative. PPM perioperative Device form RX is still pending form Dr. Excell Seltzer. Anesthesia team to evaluate on the day of surgery.    VS: BP (!) 146/86   Pulse 87   Temp 36.5 C   Resp 20   Ht 5' (1.524 m)   Wt 81.2 kg   SpO2 99%   BMI 34.96 kg/m    PROVIDERS: Buckner Malta, MD is PCP Belva Crome, MD is cardiologist Huel Coventry, MD is EP cardiologist [facility]  Everywhere)   LABS: Preoperative labs noted.  (all labs ordered are listed, but only abnormal results are displayed)  Labs Reviewed  PROTIME-INR - Abnormal; Notable for the following components:      Result Value   Prothrombin Time 17.9 (*)    INR 1.5 (*)    All other components within normal limits  BASIC METABOLIC PANEL - Abnormal; Notable for the following components:   Glucose, Bld 131 (*)    Creatinine, Ser 1.66 (*)    GFR calc non Af Amer 32 (*)    GFR calc Af Amer 37 (*)    All other components within normal limits  SURGICAL PCR SCREEN  CBC     IMAGES: MRI L-spine 10/14/18 [facility]): Result Impression - Severe canal stenosis at L4-L5 with near complete effacement of the thecal sac and compression of the cauda equina nerve roots. - Severe canal stenosis at L3-L4. - Narrowing of the lateral recesses from L2 to L5 with impingement of the descending L3, L4 and L5 nerve roots. - Mild to moderate neural foraminal stenosis as described above.  CXR 03/17/18 [facility]): Result Impression Interval insertion of left chest cardiac pacer with leads appropriately positioned in the right atrium and right ventricle. No pneumothorax.   EKG: 01/17/19: Atrial-paced rhythm with Premature atrial complexes Cannot rule out Inferior infarct , age undetermined ST & T wave abnormality, consider anterolateral ischemia Abnormal ECG - Known inferolateral T wave inversion on 10/13/17 tracing. T wave abnormality more prominent in V3-4.  PPM Evaluation 11/08/18 [facility]): Visit Date: 11/18/2018 Findings: Normal device function, Tested Lead measurements stable and within normal range, Adequate battery reserve-12.9 years Arrhythmias: (4) NS-VT; On EGM appears to be NSVT; longest 2 seconds in duration; fastest 207 bpm; last 11/13/2018. Plan: Continue Routine Remote Monitoring    CV: Nuclear stress test 01/11/19:  Nuclear stress EF: 73%.  The left ventricular ejection fraction is hyperdynamic  (>65%).  T wave inversion of 0.5 mm was noted during stress. Same as baseline  There was no ST segment deviation noted during stress.  Defect 1: There is a small defect of mild severity present in the basal anteroseptal location.  The study is normal.  This is a low risk study. The fixed defect in the anteroseptal wall is likely secondary to soft tissue attenuation.  2-21 day External ECG 02/26/18 [facility]): Result Impression Patient had a min HR of 43 bpm, max HR of 200 bpm, and avg HR of 70 bpm. Predominant underlying rhythm was Sinus Rhythm. 5 Ventricular Tachycardia runs occurred, the run with the fastest interval lasting 4 beats with a max rate of 200 bpm, the longest lasting 13 beats with an avg rate of 111 bpm. 72 Supraventricular Tachycardia runs occurred, the run with the fastest interval lasting 7 beats with a max rate of 200 bpm, the longest lasting 28.4 secs with an avg rate of 126 bpm. Some episodes of Supraventricular Tachycardia may be possible Atrial Tachycardia with variable block. Isolated SVEs were occasional (2.1%, [id number]), SVE Couplets were rare (<1.0%, 402), and SVE Triplets were rare (<1.0%, 36). Isolated VEs were occasional (2.1%, [id number]), VE Couplets were rare (<1.0%, 183), and VE Triplets were rare (<1.0%, 1). Ventricular Bigeminy and Trigeminy were present.  Cardiac event monitor 11/04/17-12/03/17: Conclusion:  Atrial fibrillation with rapid ventricular rate at times.  This is paroxysmal in nature. Atrial flutter was seen with variable control of ventricular rate. The patient also had a prolonged R-R interval in the early hours of the morning.   Echo 10/30/17: Study Conclusions - Left ventricle: The cavity size was normal. Wall thickness was   increased in a pattern of mild LVH. Systolic function was normal.   The estimated ejection fraction was in the range of 50% to 55%.   Wall motion was normal; there were no regional wall motion   abnormalities. Doppler parameters  are consistent with a   reversible restrictive pattern, indicative of decreased left   ventricular diastolic compliance and/or increased left atrial   pressure (grade 3 diastolic dysfunction). Doppler parameters are   consistent with high ventricular filling pressure. - Mitral valve: Severely thickened annulus. Structurally normal   valve. Good functional result with MV repair reported. Valve area   by continuity equation (using LVOT flow): 1.54 cm^2. - Left atrium: The atrium was severely dilated. - Right atrium: The atrium was mildly dilated. - Atrial septum: The septum bowed from left to right, consistent   with increased left atrial pressure. - Pulmonary arteries: PA peak pressure: 31 mm Hg (S). - Pericardium, extracardiac: A trivial pericardial effusion was   identified posterior to the heart.  Cardiac cath (PRE-MV REPAIR) 03/30/15 [facility]): Findings: 1. Mild coronary artery disease. 2. Mild elevated left ventricular filling pressures (LVEPD = 19 mm Hg). 3. Normal left ventricular contractile function (estimated LVEF = 55%). 4. Mildly elevated pulmonary artery pressures 5. 3+ mitral regurgitation with mild mitral valve prolapse (S/p MV Repair 06/08/15)    Past Medical History:  Diagnosis Date  . Atrial  fibrillation ([place])   . Dysrhythmia   . H/O mitral valve repair   . Hypertension   . Presence of permanent cardiac pacemaker   . Stroke [facility])    per patient, "mini stroke in 2005/06"    Past Surgical History:  Procedure Laterality Date  . ABDOMINAL HYSTERECTOMY    . APPENDECTOMY    . CATARACT EXTRACTION W/ INTRAOCULAR LENS IMPLANT Left   . CESAREAN SECTION    . CHOLECYSTECTOMY    . EYE SURGERY    . GALLBLADDER SURGERY    . open heart surgery    . TONSILLECTOMY      MEDICATIONS: . acetaminophen (TYLENOL) 650 MG CR tablet  . ALPRAZolam (XANAX) 0.5 MG tablet  . apixaban (ELIQUIS) 5 MG TABS tablet  . atorvastatin (LIPITOR) 40 MG tablet  . Calcium Carbonate-Vit  D-Min (CALCIUM 1200) 1200-1000 MG-UNIT CHEW  . Cholecalciferol (DIALYVITE VITAMIN D 5000) 125 MCG (5000 UT) capsule  . furosemide (LASIX) 20 MG tablet  . gabapentin (NEURONTIN) 300 MG capsule  . hydrochlorothiazide (HYDRODIURIL) 12.5 MG tablet  . lidocaine (LIDODERM) 5 %  . Magnesium Oxide 250 MG TABS  . Menthol, Topical Analgesic, (BIOFREEZE EX)  . metoprolol tartrate (LOPRESSOR) 25 MG tablet  . potassium chloride SA (K-DUR,KLOR-CON) 20 MEQ tablet  . risedronate (ACTONEL) 35 MG tablet  . risedronate (ACTONEL) 5 MG tablet  . telmisartan-hydrochlorothiazide (MICARDIS HCT) 40-12.5 MG tablet  . traMADol (ULTRAM) 50 MG tablet  . traZODone (DESYREL) 50 MG tablet   No current facility-administered medications for this encounter.      Myra Gianotti, PA-C Surgical Short Stay/Anesthesiology [facility] Phone [phone] [facility] Phone [phone] 01/18/2019 11:41 AM

## 2019-01-18 NOTE — Anesthesia Preprocedure Evaluation (Addendum)
Anesthesia Evaluation  Patient identified by MRN, date of birth, ID band Patient awake    Reviewed: Allergy & Precautions, NPO status , Patient's Chart, lab work & pertinent test results, reviewed documented beta blocker date and time   Airway Mallampati: III  TM Distance: >3 FB Neck ROM: Full  Mouth opening: Limited Mouth Opening  Dental no notable dental hx. (+) Teeth Intact, Dental Advisory Given   Pulmonary neg pulmonary ROS,    Pulmonary exam normal breath sounds clear to auscultation       Cardiovascular hypertension, Pt. on medications and Pt. on home beta blockers Normal cardiovascular exam+ dysrhythmias Atrial Fibrillation + pacemaker + Valvular Problems/Murmurs (s/p MV repair 2017) MR and MVP  Rhythm:Regular Rate:Normal  EKG: 01/17/19: Atrial-paced rhythm with Premature atrial complexes Cannot rule out Inferior infarct , age undetermined ST & T wave abnormality, consider anterolateral ischemia Abnormal ECG - Known inferolateral T wave inversion on 10/13/17 tracing. T wave abnormality more prominent in V3-4.  PPM Evaluation 11/08/18 [facility]): Visit Date: 11/18/2018 Findings: Normal device function, Tested Lead measurements stable and within normal range, Adequate battery reserve-12.9 years Arrhythmias: (4) NS-VT; On EGM appears to be NSVT; longest 2 seconds in duration; fastest 207 bpm; last 11/13/2018. Plan: Continue Routine Remote Monitoring    CV: Nuclear stress test 01/11/19: Nuclear stress EF: 73%. The left ventricular ejection fraction is hyperdynamic (>65%). T wave inversion of 0.5 mm was noted during stress. Same as baseline There was no ST segment deviation noted during stress. Defect 1: There is a small defect of mild severity present in the basal anteroseptal location. The study is normal. This is a low risk study. The fixed defect in the anteroseptal wall is likely secondary to soft tissue  attenuation.  Echo 10/30/17: Study Conclusions - Left ventricle: The cavity size was normal. Wall thickness wasincreased in a pattern of mild LVH. Systolic function was normal.The estimated ejection fraction was in the range of 50% to 55%. Wall motion was normal; there were no regional wall motionabnormalities. Doppler parameters are consistent with a reversible restrictive pattern, indicative of decreased leftventricular diastolic compliance and/or increased left atrial pressure (grade 3 diastolic dysfunction). Doppler parameters are consistent with high ventricular filling pressure. - Mitral valve: Severely thickened annulus. Structurally normal valve. Good functional results with MV repair. - Left atrium: The atrium was severely dilated. - Right atrium: The atrium was mildly dilated. - Atrial septum: The septum bowed from left to right, consistentwith increased left atrial pressure. - Pulmonary arteries: PA peak pressure: 31 mm Hg (S). - Pericardium, extracardiac: A trivial pericardial effusion was identified posterior to the heart.  Cardiac cath (PRE-MV REPAIR) 03/30/15 [facility]): Findings: 1. Mild coronary artery disease. 2. Mild elevated left ventricular filling pressures (LVEPD = 19 mm Hg). 3. Normal left ventricular contractile function (estimated LVEF = 55%). 4. Mildly elevated pulmonary artery pressures 5. 3+ mitral regurgitation with mild mitral valve prolapse (S/p MV Repair 06/08/15)   Neuro/Psych CVA (2005), No Residual Symptoms negative psych ROS   GI/Hepatic negative GI ROS, Neg liver ROS,   Endo/Other  negative endocrine ROS  Renal/GU negative Renal ROS  negative genitourinary   Musculoskeletal negative musculoskeletal ROS (+)   Abdominal   Peds  Hematology  (+) Blood dyscrasia (on eliquis, last dose 01/15/19), ,   Anesthesia Other Findings EP interrogated PPM. Functioning properly. Atrial paced at 60. Underlying junctional rhythm at 45 bpm.   Reproductive/Obstetrics  Anesthesia Physical Anesthesia Plan  ASA: III  Anesthesia Plan: General   Post-op Pain Management:    Induction: Intravenous  PONV Risk Score and Plan: 3 and Midazolam, Dexamethasone and Ondansetron  Airway Management Planned: Oral ETT  Additional Equipment:   Intra-op Plan:   Post-operative Plan: Extubation in OR  Informed Consent: I have reviewed the patients History and Physical, chart, labs and discussed the procedure including the risks, benefits and alternatives for the proposed anesthesia with the patient or authorized representative who has indicated his/her understanding and acceptance.     Dental advisory given  Plan Discussed with: CRNA  Anesthesia Plan Comments:        Anesthesia Quick Evaluation

## 2019-01-19 ENCOUNTER — Encounter (HOSPITAL_COMMUNITY)
Admission: RE | Disposition: A | Discharge status: Home / Self Care | Payer: Self-pay | Source: Home / Self Care | Attending: Neurosurgery

## 2019-01-19 ENCOUNTER — Ambulatory Visit (HOSPITAL_COMMUNITY): Payer: Medicare Other | Admitting: Anesthesiology

## 2019-01-19 ENCOUNTER — Other Ambulatory Visit: Payer: Self-pay

## 2019-01-19 ENCOUNTER — Ambulatory Visit (HOSPITAL_COMMUNITY)
Admission: RE | Admit: 2019-01-19 | Discharge: 2019-01-20 | Disposition: A | Discharge status: Home / Self Care | Payer: Medicare Other | Attending: Neurosurgery | Admitting: Neurosurgery

## 2019-01-19 ENCOUNTER — Ambulatory Visit (HOSPITAL_COMMUNITY): Payer: Medicare Other

## 2019-01-19 ENCOUNTER — Ambulatory Visit (HOSPITAL_COMMUNITY): Payer: Medicare Other | Admitting: Vascular Surgery

## 2019-01-19 ENCOUNTER — Encounter (HOSPITAL_COMMUNITY): Payer: Self-pay

## 2019-01-19 DIAGNOSIS — Z79899 Other long term (current) drug therapy: Secondary | ICD-10-CM | POA: Diagnosis not present

## 2019-01-19 DIAGNOSIS — Z950 Presence of cardiac pacemaker: Secondary | ICD-10-CM | POA: Diagnosis not present

## 2019-01-19 DIAGNOSIS — Z419 Encounter for procedure for purposes other than remedying health state, unspecified: Secondary | ICD-10-CM

## 2019-01-19 DIAGNOSIS — I4891 Unspecified atrial fibrillation: Secondary | ICD-10-CM | POA: Insufficient documentation

## 2019-01-19 DIAGNOSIS — Z8673 Personal history of transient ischemic attack (TIA), and cerebral infarction without residual deficits: Secondary | ICD-10-CM | POA: Diagnosis not present

## 2019-01-19 DIAGNOSIS — I10 Essential (primary) hypertension: Secondary | ICD-10-CM | POA: Diagnosis not present

## 2019-01-19 DIAGNOSIS — M48062 Spinal stenosis, lumbar region with neurogenic claudication: Secondary | ICD-10-CM

## 2019-01-19 DIAGNOSIS — Z7901 Long term (current) use of anticoagulants: Secondary | ICD-10-CM | POA: Diagnosis not present

## 2019-01-19 DIAGNOSIS — M5416 Radiculopathy, lumbar region: Secondary | ICD-10-CM | POA: Insufficient documentation

## 2019-01-19 HISTORY — PX: LUMBAR LAMINECTOMY/DECOMPRESSION MICRODISCECTOMY: SHX5026

## 2019-01-19 HISTORY — DX: Spinal stenosis, lumbar region with neurogenic claudication: M48.062

## 2019-01-19 SURGERY — LUMBAR LAMINECTOMY/DECOMPRESSION MICRODISCECTOMY 2 LEVELS
Anesthesia: General

## 2019-01-19 MED ORDER — METOPROLOL TARTRATE 12.5 MG HALF TABLET
12.5000 mg | ORAL_TABLET | Freq: Two times a day (BID) | ORAL | Status: DC
  Administered 2019-01-19: 21:00:00 12.5 mg via ORAL
  Filled 2019-01-19: qty 1

## 2019-01-19 MED ORDER — CEFAZOLIN SODIUM-DEXTROSE 2-4 GM/100ML-% IV SOLN
2.0000 g | INTRAVENOUS | Status: AC
  Administered 2019-01-19: 15:00:00 2 g via INTRAVENOUS
  Filled 2019-01-19: qty 100

## 2019-01-19 MED ORDER — BUPIVACAINE-EPINEPHRINE (PF) 0.5% -1:200000 IJ SOLN
INTRAMUSCULAR | Status: DC | PRN
  Administered 2019-01-19 (×2): 10 mL

## 2019-01-19 MED ORDER — BUPIVACAINE-EPINEPHRINE (PF) 0.5% -1:200000 IJ SOLN
INTRAMUSCULAR | Status: AC
  Filled 2019-01-19: qty 30

## 2019-01-19 MED ORDER — MENTHOL 3 MG MT LOZG: 1.0000 | LOZENGE | OROMUCOSAL | Status: DC | PRN

## 2019-01-19 MED ORDER — MIDAZOLAM HCL 2 MG/2ML IJ SOLN
INTRAMUSCULAR | Status: AC
  Filled 2019-01-19: qty 2

## 2019-01-19 MED ORDER — TRAMADOL HCL 50 MG PO TABS
100.0000 mg | ORAL_TABLET | Freq: Four times a day (QID) | ORAL | Status: DC | PRN
  Administered 2019-01-20: 100 mg via ORAL
  Filled 2019-01-19: qty 2

## 2019-01-19 MED ORDER — PHENYLEPHRINE 40 MCG/ML (10ML) SYRINGE FOR IV PUSH (FOR BLOOD PRESSURE SUPPORT)
PREFILLED_SYRINGE | INTRAVENOUS | Status: DC | PRN
  Administered 2019-01-19: 60 ug via INTRAVENOUS

## 2019-01-19 MED ORDER — ACETAMINOPHEN 325 MG PO TABS
650.0000 mg | ORAL_TABLET | ORAL | Status: DC | PRN
  Administered 2019-01-19: 650 mg via ORAL
  Filled 2019-01-19: qty 2

## 2019-01-19 MED ORDER — LIDOCAINE 2% (20 MG/ML) 5 ML SYRINGE
INTRAMUSCULAR | Status: DC | PRN
  Administered 2019-01-19: 100 mg via INTRAVENOUS

## 2019-01-19 MED ORDER — SODIUM CHLORIDE 0.9 % IV SOLN
INTRAVENOUS | Status: DC | PRN
  Administered 2019-01-19: 16:00:00 30 ug/min via INTRAVENOUS

## 2019-01-19 MED ORDER — FENTANYL CITRATE (PF) 250 MCG/5ML IJ SOLN
INTRAMUSCULAR | Status: DC | PRN
  Administered 2019-01-19 (×3): 50 ug via INTRAVENOUS

## 2019-01-19 MED ORDER — DOCUSATE SODIUM 100 MG PO CAPS
100.0000 mg | ORAL_CAPSULE | Freq: Two times a day (BID) | ORAL | Status: DC
  Administered 2019-01-19 – 2019-01-20 (×2): 100 mg via ORAL
  Filled 2019-01-19 (×2): qty 1

## 2019-01-19 MED ORDER — ONDANSETRON HCL 4 MG/2ML IJ SOLN: 4.0000 mg | Freq: Four times a day (QID) | INTRAMUSCULAR | Status: DC | PRN

## 2019-01-19 MED ORDER — DEXAMETHASONE SODIUM PHOSPHATE 10 MG/ML IJ SOLN
INTRAMUSCULAR | Status: DC | PRN
  Administered 2019-01-19: 10 mg via INTRAVENOUS

## 2019-01-19 MED ORDER — TELMISARTAN-HCTZ 40-12.5 MG PO TABS: 1.0000 | ORAL_TABLET | Freq: Every day | ORAL | Status: DC

## 2019-01-19 MED ORDER — SUGAMMADEX SODIUM 200 MG/2ML IV SOLN
INTRAVENOUS | Status: DC | PRN
  Administered 2019-01-19: 200 mg via INTRAVENOUS

## 2019-01-19 MED ORDER — ACETAMINOPHEN 650 MG RE SUPP: 650.0000 mg | RECTAL | Status: DC | PRN

## 2019-01-19 MED ORDER — ACETAMINOPHEN 500 MG PO TABS
1000.0000 mg | ORAL_TABLET | Freq: Once | ORAL | Status: AC
  Administered 2019-01-19: 1000 mg via ORAL
  Filled 2019-01-19: qty 2

## 2019-01-19 MED ORDER — BACITRACIN ZINC 500 UNIT/GM EX OINT
TOPICAL_OINTMENT | CUTANEOUS | Status: AC
  Filled 2019-01-19: qty 28.35

## 2019-01-19 MED ORDER — SODIUM CHLORIDE 0.9 % IV SOLN
INTRAVENOUS | Status: DC | PRN
  Administered 2019-01-19: 16:00:00 500 mL

## 2019-01-19 MED ORDER — THROMBIN 5000 UNITS EX SOLR
CUTANEOUS | Status: AC
  Filled 2019-01-19: qty 5000

## 2019-01-19 MED ORDER — SODIUM CHLORIDE 0.9 % IV SOLN: 250.0000 mL | INTRAVENOUS | Status: DC

## 2019-01-19 MED ORDER — FENTANYL CITRATE (PF) 250 MCG/5ML IJ SOLN
INTRAMUSCULAR | Status: AC
  Filled 2019-01-19: qty 5

## 2019-01-19 MED ORDER — DEXAMETHASONE SODIUM PHOSPHATE 10 MG/ML IJ SOLN
INTRAMUSCULAR | Status: AC
  Filled 2019-01-19: qty 1

## 2019-01-19 MED ORDER — MIDAZOLAM HCL 5 MG/5ML IJ SOLN
INTRAMUSCULAR | Status: DC | PRN
  Administered 2019-01-19: 2 mg via INTRAVENOUS

## 2019-01-19 MED ORDER — THROMBIN 5000 UNITS EX SOLR
OROMUCOSAL | Status: DC | PRN
  Administered 2019-01-19: 5 mL via TOPICAL

## 2019-01-19 MED ORDER — IRBESARTAN 150 MG PO TABS
150.0000 mg | ORAL_TABLET | Freq: Every day | ORAL | Status: DC
  Administered 2019-01-19: 150 mg via ORAL
  Filled 2019-01-19 (×2): qty 1

## 2019-01-19 MED ORDER — OXYCODONE HCL 5 MG PO TABS: 5.0000 mg | ORAL_TABLET | ORAL | Status: DC | PRN

## 2019-01-19 MED ORDER — BACITRACIN ZINC 500 UNIT/GM EX OINT
TOPICAL_OINTMENT | CUTANEOUS | Status: DC | PRN
  Administered 2019-01-19: 1 via TOPICAL

## 2019-01-19 MED ORDER — ROCURONIUM BROMIDE 10 MG/ML (PF) SYRINGE
PREFILLED_SYRINGE | INTRAVENOUS | Status: DC | PRN
  Administered 2019-01-19: 80 mg via INTRAVENOUS

## 2019-01-19 MED ORDER — SODIUM CHLORIDE 0.9% FLUSH: 3.0000 mL | INTRAVENOUS | Status: DC | PRN

## 2019-01-19 MED ORDER — PHENOL 1.4 % MT LIQD: 1.0000 | OROMUCOSAL | Status: DC | PRN

## 2019-01-19 MED ORDER — CHLORHEXIDINE GLUCONATE CLOTH 2 % EX PADS: 6.0000 | MEDICATED_PAD | Freq: Once | CUTANEOUS | Status: DC

## 2019-01-19 MED ORDER — FENTANYL CITRATE (PF) 100 MCG/2ML IJ SOLN
INTRAMUSCULAR | Status: AC
  Filled 2019-01-19: qty 2

## 2019-01-19 MED ORDER — CEFAZOLIN SODIUM-DEXTROSE 2-4 GM/100ML-% IV SOLN
2.0000 g | Freq: Three times a day (TID) | INTRAVENOUS | Status: AC
  Administered 2019-01-19 – 2019-01-20 (×2): 2 g via INTRAVENOUS
  Filled 2019-01-19 (×2): qty 100

## 2019-01-19 MED ORDER — FUROSEMIDE 20 MG PO TABS
20.0000 mg | ORAL_TABLET | Freq: Every day | ORAL | Status: DC
  Filled 2019-01-19: qty 1

## 2019-01-19 MED ORDER — LACTATED RINGERS IV SOLN
INTRAVENOUS | Status: DC
  Administered 2019-01-19 (×2): via INTRAVENOUS

## 2019-01-19 MED ORDER — MORPHINE SULFATE (PF) 4 MG/ML IV SOLN: 4.0000 mg | INTRAVENOUS | Status: DC | PRN

## 2019-01-19 MED ORDER — ZOLPIDEM TARTRATE 5 MG PO TABS: 5.0000 mg | ORAL_TABLET | Freq: Every evening | ORAL | Status: DC | PRN

## 2019-01-19 MED ORDER — HYDROCHLOROTHIAZIDE 25 MG PO TABS
12.5000 mg | ORAL_TABLET | Freq: Every day | ORAL | Status: DC
  Filled 2019-01-19: qty 1

## 2019-01-19 MED ORDER — PROPOFOL 10 MG/ML IV BOLUS
INTRAVENOUS | Status: DC | PRN
  Administered 2019-01-19: 100 mg via INTRAVENOUS

## 2019-01-19 MED ORDER — MAGNESIUM OXIDE 400 (241.3 MG) MG PO TABS
200.0000 mg | ORAL_TABLET | Freq: Two times a day (BID) | ORAL | Status: DC
  Administered 2019-01-19 – 2019-01-20 (×2): 200 mg via ORAL
  Filled 2019-01-19 (×2): qty 1
  Filled 2019-01-19 (×2): qty 0.5

## 2019-01-19 MED ORDER — OXYCODONE HCL 5 MG PO TABS
10.0000 mg | ORAL_TABLET | ORAL | Status: DC | PRN
  Administered 2019-01-19 – 2019-01-20 (×4): 10 mg via ORAL
  Filled 2019-01-19 (×4): qty 2

## 2019-01-19 MED ORDER — CYCLOBENZAPRINE HCL 10 MG PO TABS
10.0000 mg | ORAL_TABLET | Freq: Three times a day (TID) | ORAL | Status: DC | PRN
  Administered 2019-01-19 – 2019-01-20 (×2): 10 mg via ORAL
  Filled 2019-01-19 (×2): qty 1

## 2019-01-19 MED ORDER — GABAPENTIN 300 MG PO CAPS: 300.0000 mg | ORAL_CAPSULE | Freq: Three times a day (TID) | ORAL | Status: DC | PRN

## 2019-01-19 MED ORDER — SODIUM CHLORIDE 0.9% FLUSH: 3.0000 mL | Freq: Two times a day (BID) | INTRAVENOUS | Status: DC

## 2019-01-19 MED ORDER — FENTANYL CITRATE (PF) 100 MCG/2ML IJ SOLN
25.0000 ug | INTRAMUSCULAR | Status: DC | PRN
  Administered 2019-01-19 (×2): 50 ug via INTRAVENOUS

## 2019-01-19 MED ORDER — ONDANSETRON HCL 4 MG PO TABS: 4.0000 mg | ORAL_TABLET | Freq: Four times a day (QID) | ORAL | Status: DC | PRN

## 2019-01-19 MED ORDER — ONDANSETRON HCL 4 MG/2ML IJ SOLN
INTRAMUSCULAR | Status: DC | PRN
  Administered 2019-01-19: 4 mg via INTRAVENOUS

## 2019-01-19 MED ORDER — BISACODYL 10 MG RE SUPP: 10.0000 mg | Freq: Every day | RECTAL | Status: DC | PRN

## 2019-01-19 MED ORDER — 0.9 % SODIUM CHLORIDE (POUR BTL) OPTIME
TOPICAL | Status: DC | PRN
  Administered 2019-01-19: 16:00:00 1000 mL

## 2019-01-19 MED ORDER — ACETAMINOPHEN 500 MG PO TABS
1000.0000 mg | ORAL_TABLET | Freq: Four times a day (QID) | ORAL | Status: DC
  Administered 2019-01-19 – 2019-01-20 (×2): 1000 mg via ORAL
  Filled 2019-01-19 (×3): qty 2

## 2019-01-19 MED ORDER — ATORVASTATIN CALCIUM 40 MG PO TABS
40.0000 mg | ORAL_TABLET | Freq: Every evening | ORAL | Status: DC
  Administered 2019-01-19: 40 mg via ORAL
  Filled 2019-01-19: qty 1

## 2019-01-19 MED ORDER — POTASSIUM CHLORIDE CRYS ER 20 MEQ PO TBCR
40.0000 meq | EXTENDED_RELEASE_TABLET | Freq: Every day | ORAL | Status: DC
  Administered 2019-01-20: 10:00:00 40 meq via ORAL
  Filled 2019-01-19: qty 2

## 2019-01-19 MED ORDER — HYDROCHLOROTHIAZIDE 12.5 MG PO CAPS: 12.5000 mg | ORAL_CAPSULE | Freq: Every day | ORAL | Status: DC

## 2019-01-19 SURGICAL SUPPLY — 47 items
BAG DECANTER FOR FLEXI CONT (MISCELLANEOUS) ×3 IMPLANT
BENZOIN TINCTURE PRP APPL 2/3 (GAUZE/BANDAGES/DRESSINGS) ×3 IMPLANT
BLADE CLIPPER SURG (BLADE) IMPLANT
BUR MATCHSTICK NEURO 3.0 LAGG (BURR) ×3 IMPLANT
BUR PRECISION FLUTE 6.0 (BURR) ×3 IMPLANT
CANISTER SUCT 3000ML PPV (MISCELLANEOUS) ×3 IMPLANT
CARTRIDGE OIL MAESTRO DRILL (MISCELLANEOUS) ×1 IMPLANT
CLOSURE WOUND 1/2 X4 (GAUZE/BANDAGES/DRESSINGS) ×1
COVER WAND RF STERILE (DRAPES) IMPLANT
DIFFUSER DRILL AIR PNEUMATIC (MISCELLANEOUS) ×3 IMPLANT
DRAPE LAPAROTOMY 100X72X124 (DRAPES) ×3 IMPLANT
DRAPE MICROSCOPE LEICA (MISCELLANEOUS) ×3 IMPLANT
DRAPE POUCH INSTRU U-SHP 10X18 (DRAPES) ×3 IMPLANT
DRAPE SURG 17X23 STRL (DRAPES) ×12 IMPLANT
DRSG OPSITE POSTOP 4X8 (GAUZE/BANDAGES/DRESSINGS) ×3 IMPLANT
ELECT BLADE 4.0 EZ CLEAN MEGAD (MISCELLANEOUS) ×3
ELECT REM PT RETURN 9FT ADLT (ELECTROSURGICAL) ×3
ELECTRODE BLDE 4.0 EZ CLN MEGD (MISCELLANEOUS) ×1 IMPLANT
ELECTRODE REM PT RTRN 9FT ADLT (ELECTROSURGICAL) ×1 IMPLANT
GAUZE 4X4 16PLY RFD (DISPOSABLE) IMPLANT
GAUZE SPONGE 4X4 12PLY STRL (GAUZE/BANDAGES/DRESSINGS) ×3 IMPLANT
GLOVE BIO SURGEON STRL SZ8 (GLOVE) ×3 IMPLANT
GLOVE BIO SURGEON STRL SZ8.5 (GLOVE) ×3 IMPLANT
GLOVE EXAM NITRILE XL STR (GLOVE) IMPLANT
GOWN STRL REUS W/ TWL LRG LVL3 (GOWN DISPOSABLE) ×3 IMPLANT
GOWN STRL REUS W/ TWL XL LVL3 (GOWN DISPOSABLE) ×1 IMPLANT
GOWN STRL REUS W/TWL 2XL LVL3 (GOWN DISPOSABLE) IMPLANT
GOWN STRL REUS W/TWL LRG LVL3 (GOWN DISPOSABLE) ×6
GOWN STRL REUS W/TWL XL LVL3 (GOWN DISPOSABLE) ×2
KIT BASIN OR (CUSTOM PROCEDURE TRAY) ×3 IMPLANT
KIT TURNOVER KIT B (KITS) ×3 IMPLANT
NEEDLE HYPO 21X1.5 SAFETY (NEEDLE) IMPLANT
NEEDLE HYPO 22GX1.5 SAFETY (NEEDLE) ×3 IMPLANT
NS IRRIG 1000ML POUR BTL (IV SOLUTION) ×3 IMPLANT
OIL CARTRIDGE MAESTRO DRILL (MISCELLANEOUS) ×3
PACK LAMINECTOMY NEURO (CUSTOM PROCEDURE TRAY) ×3 IMPLANT
PAD ARMBOARD 7.5X6 YLW CONV (MISCELLANEOUS) ×9 IMPLANT
PATTIES SURGICAL .5 X1 (DISPOSABLE) IMPLANT
RUBBERBAND STERILE (MISCELLANEOUS) IMPLANT
SPONGE SURGIFOAM ABS GEL SZ50 (HEMOSTASIS) IMPLANT
STRIP CLOSURE SKIN 1/2X4 (GAUZE/BANDAGES/DRESSINGS) ×2 IMPLANT
SUT VIC AB 1 CT1 18XBRD ANBCTR (SUTURE) ×2 IMPLANT
SUT VIC AB 1 CT1 8-18 (SUTURE) ×4
SUT VIC AB 2-0 CP2 18 (SUTURE) ×6 IMPLANT
TOWEL GREEN STERILE (TOWEL DISPOSABLE) ×3 IMPLANT
TOWEL GREEN STERILE FF (TOWEL DISPOSABLE) ×3 IMPLANT
WATER STERILE IRR 1000ML POUR (IV SOLUTION) ×3 IMPLANT

## 2019-01-19 NOTE — Progress Notes (Signed)
Spoke with Dr. Lanetta Inch.  MD will review chart and will call [phone] if she wants rep to come.

## 2019-01-19 NOTE — H&P (Signed)
Subjective: The patient is a 68 year old white female who has complained of back and leg pain consistent with a lumbar radiculopathy.  She has failed medical management and was worked up with a lumbar MRI which demonstrated spinal stenosis at L3-4 and L4-5.  I discussed the situation with the patient.  She has decided proceed with surgery.  Past Medical History:  Diagnosis Date  . Atrial fibrillation (HCC)   . Dysrhythmia   . H/O mitral valve repair   . Hypertension   . Presence of permanent cardiac pacemaker   . Stroke [facility])    per patient, "mini stroke in 2005/06"    Past Surgical History:  Procedure Laterality Date  . ABDOMINAL HYSTERECTOMY    . APPENDECTOMY    . CATARACT EXTRACTION W/ INTRAOCULAR LENS IMPLANT Left   . CESAREAN SECTION    . CHOLECYSTECTOMY    . EYE SURGERY    . GALLBLADDER SURGERY    . open heart surgery    . TONSILLECTOMY      No Known Allergies  Social History   Tobacco Use  . Smoking status: Never Smoker  . Smokeless tobacco: Never Used  Substance Use Topics  . Alcohol use: No    Family History  Problem Relation Age of Onset  . Heart attack Father   . Kidney disease Mother   . Hypertension Mother    Prior to Admission medications   Medication Sig Start Date End Date Taking? Authorizing Provider  acetaminophen (TYLENOL) 650 MG CR tablet Take 650 mg by mouth every 8 (eight) hours as needed for pain.   Yes [provider]  ALPRAZolam Prudy Feeler) 0.5 MG tablet Take 0.5 mg by mouth daily as needed for anxiety.   Yes [provider]  apixaban (ELIQUIS) 5 MG TABS tablet Take 5 mg by mouth 2 (two) times a day.  06/01/18  Yes [provider]  atorvastatin (LIPITOR) 40 MG tablet Take 40 mg by mouth every evening.  06/15/15  Yes [provider]  Calcium Carbonate-Vit D-Min (CALCIUM 1200) 1200-1000 MG-UNIT CHEW Chew 1 tablet by mouth daily.   Yes [provider]  Cholecalciferol (DIALYVITE VITAMIN D 5000) 125 MCG (5000  UT) capsule Take 5,000 Units by mouth daily.   Yes [provider]  furosemide (LASIX) 20 MG tablet Take 20 mg by mouth daily. 06/15/15  Yes [provider]  gabapentin (NEURONTIN) 300 MG capsule Take 300 mg by mouth 3 (three) times daily as needed (pain).    Yes [provider]  hydrochlorothiazide (HYDRODIURIL) 12.5 MG tablet Take 12.5 mg by mouth daily.   Yes [provider]  lidocaine (LIDODERM) 5 % Place 1 patch onto the skin daily as needed (pain). Remove & Discard patch within 12 hours or as directed by MD   Yes [provider]  Magnesium Oxide 250 MG TABS Take 250 mg by mouth 2 (two) times daily.    Yes [provider]  metoprolol tartrate (LOPRESSOR) 25 MG tablet Take 12.5 mg by mouth 2 (two) times a day.  03/10/18  Yes [provider]  potassium chloride SA (K-DUR,KLOR-CON) 20 MEQ tablet Take 40 mEq by mouth daily.    Yes [provider]  telmisartan-hydrochlorothiazide (MICARDIS HCT) 40-12.5 MG tablet Take 1 tablet by mouth daily.   Yes [provider]  traMADol (ULTRAM) 50 MG tablet Take 50 mg by mouth every 8 (eight) hours as needed for moderate pain.   Yes [provider]  traZODone (DESYREL) 50 MG  tablet Take 50 mg by mouth at bedtime as needed for sleep.    Yes [provider]  Menthol, Topical Analgesic, (BIOFREEZE EX) Apply 1 application topically daily as needed (pain).    [provider]  risedronate (ACTONEL) 35 MG tablet Take 35 mg by mouth every Saturday. with water on empty stomach, nothing by mouth or lie down for next 30 minutes.    [provider]  risedronate (ACTONEL) 5 MG tablet Take 5 mg by mouth daily before breakfast. with water on empty stomach, nothing by mouth or lie down for next 30 minutes.    [provider]     Review of Systems  Positive ROS: As above  All other systems have been reviewed and were otherwise negative with the  exception of those mentioned in the HPI and as above.  Objective: Vital signs in last 24 hours: Temp:  [97.7 F (36.5 C)] 97.7 F (36.5 C) (09/30 1149) Pulse Rate:  [84] 84 (09/30 1149) Resp:  [20] 20 (09/30 1149) BP: (152)/(94) 152/94 (09/30 1149) SpO2:  [99 %] 99 % (09/30 1149) Weight:  [81.2 kg] 81.2 kg (09/30 1216) Estimated body mass index is 34.96 kg/m as calculated from the following:   Height as of this encounter: 5' (1.524 m).   Weight as of this encounter: 81.2 kg.   General Appearance: Alert Head: Normocephalic, without obvious abnormality, atraumatic Eyes: PERRL, conjunctiva/corneas clear, EOM's intact,    Ears: Normal  Throat: Normal  Neck: Supple, Back: unremarkable Lungs: Clear to auscultation bilaterally, respirations unlabored Heart: Regular rate and rhythm, no murmur, rub or gallop Abdomen: Soft, non-tender Extremities: Extremities normal, atraumatic, no cyanosis or edema Skin: unremarkable  NEUROLOGIC:   Mental status: alert and oriented,Motor Exam - grossly normal Sensory Exam - grossly normal Reflexes:  Coordination - grossly normal Gait - grossly normal Balance - grossly normal Cranial Nerves: I: smell Not tested  II: visual acuity  OS: Normal  OD: Normal   II: visual fields Full to confrontation  II: pupils Equal, round, reactive to light  III,VII: ptosis None  III,IV,VI: extraocular muscles  Full ROM  V: mastication Normal  V: facial light touch sensation  Normal  V,VII: corneal reflex  Present  VII: facial muscle function - upper  Normal  VII: facial muscle function - lower Normal  VIII: hearing Not tested  IX: soft palate elevation  Normal  IX,X: gag reflex Present  XI: trapezius strength  5/5  XI: sternocleidomastoid strength 5/5  XI: neck flexion strength  5/5  XII: tongue strength  Normal    Data Review Lab Results  Component Value Date   WBC 7.6 01/17/2019   HGB 14.7 01/17/2019   HCT 41.9 01/17/2019   MCV 90.5 01/17/2019    PLT 221 01/17/2019   Lab Results  Component Value Date   NA 143 01/17/2019   K 3.9 01/17/2019   CL 104 01/17/2019   CO2 27 01/17/2019   BUN 22 01/17/2019   CREATININE 1.66 (H) 01/17/2019   GLUCOSE 131 (H) 01/17/2019   Lab Results  Component Value Date   INR 1.5 (H) 01/17/2019    Assessment/Plan: L3-4 and L4-5 spinal stenosis, lumbago, lumbar radiculopathy, neurogenic claudication: I have discussed the situation with the patient.  I have reviewed her MRI scan with her and pointed out the abnormalities.  We have discussed the various treatment options including surgery.  I have described the surgical treatment option of an L3-4 and L4-5 laminectomy/laminotomy/foraminotomies.  I have shown  her surgical models.  I have given her a surgical pamphlet.  We have discussed the risks, benefits, alternatives, expected postop course, and likelihood of achieving our goals with surgery.  I have answered all the patient's questions.  She has decided to proceed with surgery.   Cristi LoronJeffrey D Indiyah Paone 01/19/2019 1:56 PM

## 2019-01-19 NOTE — Anesthesia Procedure Notes (Signed)
Procedure Name: Intubation Date/Time: 01/19/2019 2:52 PM Performed by: Mariea Clonts, CRNA Pre-anesthesia Checklist: Patient identified, Emergency Drugs available, Suction available and Patient being monitored Patient Re-evaluated:Patient Re-evaluated prior to induction Oxygen Delivery Method: Circle System Utilized Preoxygenation: Pre-oxygenation with 100% oxygen Induction Type: IV induction Ventilation: Mask ventilation without difficulty Laryngoscope Size: Mac and 3 Grade View: Grade II Tube type: Oral Tube size: 7.0 mm Number of attempts: 1 Airway Equipment and Method: Stylet and Oral airway Placement Confirmation: ETT inserted through vocal cords under direct vision,  positive ETCO2 and breath sounds checked- equal and bilateral Tube secured with: Tape Dental Injury: Teeth and Oropharynx as per pre-operative assessment

## 2019-01-19 NOTE — Progress Notes (Signed)
Dr. Lanetta Inch requested that the patients pacemaker be introgated prior to surgery,  Isabel Hardy [employer] rep notified and stated that someone will be here in about 15 minutes

## 2019-01-19 NOTE — Transfer of Care (Signed)
Immediate Anesthesia Transfer of Care Note  Patient: Isabel Hardy  Procedure(s) Performed: LUMBAR LAMINECTOMY AND FORAMINOTOMY LUMBAR THREE- LUMBAR FOUR, LUMBAR FOUR- LUMBAR FIVE (N/A )  Patient Location: PACU  Anesthesia Type:General  Level of Consciousness: awake, alert  and oriented  Airway & Oxygen Therapy: Patient Spontanous Breathing and Patient connected to nasal cannula oxygen  Post-op Assessment: Report given to RN, Post -op Vital signs reviewed and stable and Patient moving all extremities X 4  Post vital signs: Reviewed and stable  Last Vitals:  Vitals Value Taken Time  BP    Temp    Pulse    Resp    SpO2      Last Pain:  Vitals:   01/19/19 1216  TempSrc:   PainSc: 5          Complications: No apparent anesthesia complications

## 2019-01-19 NOTE — Progress Notes (Signed)
Spoke to [place] with [employer].  Tomi Bamberger stated to call back if Anesthesia wants them to.  Will ask Dr. Lanetta Inch.

## 2019-01-19 NOTE — Progress Notes (Signed)
Subjective: The patient is alert.  Objective: Vital signs in last 24 hours: Temp:  [97.7 F (36.5 C)] 97.7 F (36.5 C) (09/30 1149) Pulse Rate:  [84] 84 (09/30 1149) Resp:  [20] 20 (09/30 1149) BP: (152)/(94) 152/94 (09/30 1149) SpO2:  [99 %] 99 % (09/30 1149) Weight:  [81.2 kg] 81.2 kg (09/30 1216) Estimated body mass index is 34.96 kg/m as calculated from the following:   Height as of this encounter: 5' (1.524 m).   Weight as of this encounter: 81.2 kg.   Intake/Output from previous day: No intake/output data recorded. Intake/Output this shift: Total I/O In: 1000 [I.V.:1000] Out: 50 [Blood:50]  Physical exam the patient is alert.  She is moving her lower extremities well.  Lab Results: Recent Labs    01/17/19 1020  WBC 7.6  HGB 14.7  HCT 41.9  PLT 221   BMET Recent Labs    01/17/19 1020  NA 143  K 3.9  CL 104  CO2 27  GLUCOSE 131*  BUN 22  CREATININE 1.66*  CALCIUM 9.6    Studies/Results: Dg Lumbar Spine 1 View  Result Date: 01/19/2019 CLINICAL DATA:  L3 through L5 laminectomy. EXAM: LUMBAR SPINE - 1 VIEW COMPARISON:  Radiographs of December 31, 2018. MRI of October 14, 2018. FINDINGS: Single intraoperative cross-table lateral projection of the lumbar spine was obtained. Surgical probe is seen at approximately the L3-4 level. IMPRESSION: Surgical localization as described above. Electronically Signed   By: Marijo Conception M.D.   On: 01/19/2019 15:41    Assessment/Plan: The patient is doing well.  LOS: 0 days     Isabel Hardy 01/19/2019, 4:51 PM

## 2019-01-19 NOTE — Anesthesia Postprocedure Evaluation (Signed)
Anesthesia Post Note  Patient: Isabel Hardy  Procedure(s) Performed: LUMBAR LAMINECTOMY AND FORAMINOTOMY LUMBAR THREE- LUMBAR FOUR, LUMBAR FOUR- LUMBAR FIVE (N/A )     Patient location during evaluation: PACU Anesthesia Type: General Level of consciousness: awake and alert Pain management: pain level controlled Vital Signs Assessment: post-procedure vital signs reviewed and stable Respiratory status: spontaneous breathing, nonlabored ventilation, respiratory function stable and patient connected to nasal cannula oxygen Cardiovascular status: blood pressure returned to baseline and stable Postop Assessment: no apparent nausea or vomiting Anesthetic complications: no    Last Vitals:  Vitals:   01/19/19 1712 01/19/19 1730  BP: 127/75   Pulse: 65   Resp:    Temp:  (!) 36.4 C  SpO2: 100%     Last Pain:  Vitals:   01/19/19 1645  TempSrc:   PainSc: 8                  Chelsey L Woodrum

## 2019-01-19 NOTE — Op Note (Signed)
Brief history: The patient is a 68 year old white female who has complained of back, buttock and leg pain consistent with neurogenic claudication.  She has failed medical management and was worked up with a lumbar MRI.  This demonstrated lumbar spinal stenosis at L3-4 and L4-5.  I discussed the various treatment options with her.  She has decided to proceed with surgery after weighing the risks, benefits and alternatives.  Preoperative diagnosis: L3-4 and L4-5 spinal stenosis, lumbago, lumbar radiculopathy, neurogenic claudication  Postoperative diagnosis: The same  Procedure: Bilateral L4-5 laminectomy and bilateral L3-4 laminotomies/foraminotomies to decompress the bilateral L4 and L5 nerve roots using micro-dissection  Surgeon: Dr. Earle Gell  Asst.: Arnetha Massy nurse practitioner  Anesthesia: Gen. endotracheal  Estimated blood loss: 100 cc  Drains: None  Complications: None  Description of procedure: The patient was brought to the operating room by the anesthesia team. General endotracheal anesthesia was induced. The patient was turned to the prone position on the Wilson frame. The patient's lumbosacral region was then prepared with Betadine scrub and Betadine solution. Sterile drapes were applied.  I then injected the area to be incised with Marcaine with epinephrine solution. I then used a scalpel to make a linear midline incision over the L3-4 and L4-5 intervertebral disc space. I then used electrocautery to perform a bilateral  subperiosteal dissection exposing the spinous process and lamina of L3, L4 and L5. We obtained intraoperative radiograph to confirm our location. I then inserted the [facility] retractor for exposure.  We began the decompression by incising the interspinous ligament at L3-4 and L4-5.  I used a Leksell rongeur to remove the L4 spinous process and the caudal aspect of the L3 spinous process.  We then brought the operative microscope into the field. Under  its magnification and illumination we completed the microdissection. I used a high-speed drill to perform a laminotomy at L3-4 and L4-5 bilaterally. I then used a Kerrison punches to complete the laminectomy bilaterally at L4-5 and to widen the bilateral laminotomies at L3-4 and removed the ligamentum flavum at L3-4 and L4-5. We then used microdissection to free up the thecal sac and the bilateral L4 and L5 nerve root from the epidural tissue. I then used a Kerrison punch to perform a foraminotomy at about the bilateral L4 and L5 nerve root.  We inspected the intervertebral disc at L3-4 and L4-5 bilaterally.  There were no significant herniations.  I then palpated along the ventral surface of the thecal sac and along exit route of the bilateral L4 and L5 nerve root and noted that the neural structures were well decompressed. This completed the decompression.  We then obtained hemostasis using bipolar electrocautery. We irrigated the wound out with bacitracin solution. We then removed the retractor. We then reapproximated the patient's thoracolumbar fascia with interrupted #1 Vicryl suture. We then reapproximated the patient's subcutaneous tissue with interrupted 2-0 Vicryl suture. We then reapproximated patient's skin with Steri-Strips and benzoin. The was then coated with bacitracin ointment. The drapes were removed. The patient was subsequently returned to the supine position where they were extubated by the anesthesia team. The patient was then transported to the postanesthesia care unit in stable condition. All sponge instrument and needle counts were reportedly correct at the end of this case.

## 2019-01-20 ENCOUNTER — Encounter (HOSPITAL_COMMUNITY): Payer: Self-pay | Admitting: Neurosurgery

## 2019-01-20 DIAGNOSIS — M48062 Spinal stenosis, lumbar region with neurogenic claudication: Secondary | ICD-10-CM | POA: Diagnosis not present

## 2019-01-20 MED ORDER — CYCLOBENZAPRINE HCL 10 MG PO TABS: 10.0000 mg | ORAL_TABLET | Freq: Three times a day (TID) | ORAL | 0 refills | Status: DC | PRN

## 2019-01-20 MED ORDER — DOCUSATE SODIUM 100 MG PO CAPS: 100.0000 mg | ORAL_CAPSULE | Freq: Two times a day (BID) | ORAL | 0 refills | Status: DC

## 2019-01-20 MED ORDER — OXYCODONE HCL 10 MG PO TABS: 10.0000 mg | ORAL_TABLET | ORAL | 0 refills | Status: DC | PRN

## 2019-01-20 NOTE — Discharge Instructions (Signed)
Wound Care Keep incision covered and dry for two days.   Do not put any creams, lotions, or ointments on incision. Leave steri-strips on back.  They will fall off by themselves.  Activity Walk each and every day, increasing distance each day. No lifting greater than 5 lbs.  Avoid excessive neck motion. No driving for 2 weeks; may ride as a passenger locally.  Diet Resume your normal diet.   Return to Work Will be discussed at you follow up appointment.  Call Your Doctor If Any of These Occur Redness, drainage, or swelling at the wound.  Temperature greater than 101 degrees. Severe pain not relieved by pain medication. Incision starts to come apart. Follow Up Appt Call today for appointment in 1-2 weeks ([phone]) or for problems.  If you have any hardware placed in your spine, you will need an x-ray before your appointment.  

## 2019-01-20 NOTE — Evaluation (Signed)
Physical Therapy Evaluation Patient Details Name: Isabel Hardy MRN: [id number] DOB: 1951-01-20 Today's Date: 01/20/2019   History of Present Illness  Pt is a 68 yo female s/p Bilateral L4-5 laminectomy and bilateral L3-4 laminotomies/foraminotomies. PMHx: TIA, HTN, afib.  Clinical Impression  Pt admitted with above diagnosis. At the time of PT eval, pt was able to demonstrate transfers and ambulation with gross min guard assist progressing to supervision for safety by. Pt was educated on precautions, appropriate activity progression, and car transfer. Pt currently with functional limitations due to the deficits listed below (see PT Problem List). Pt will benefit from skilled PT to increase their independence and safety with mobility to allow discharge to the venue listed below.      Follow Up Recommendations No PT follow up;Supervision for mobility/OOB    Equipment Recommendations  None recommended by PT    Recommendations for Other Services       Precautions / Restrictions Precautions Precautions: Back Precaution Booklet Issued: Yes (comment) Precaution Comments: verbally discussed handout Restrictions Weight Bearing Restrictions: No      Mobility  Bed Mobility Overal bed mobility: Needs Assistance Bed Mobility: Rolling;Sit to Sidelying Rolling: Supervision       Sit to sidelying: Min guard General bed mobility comments: Pt with increased time and effort to get LE's back up into bed. Overall was able to complete without assistance, with min use of railings and HOB flat.   Transfers Overall transfer level: Needs assistance Equipment used: None Transfers: Sit to/from Stand Sit to Stand: Supervision         General transfer comment: Practiced several times to improve posture. Cued for wide BOS, use of arm rests to push, and to keep trunk upright to stand.   Ambulation/Gait Ambulation/Gait assistance: Min guard;Supervision Gait Distance (Feet): 400  Feet Assistive device: None Gait Pattern/deviations: Step-through pattern;Decreased stride length;Trunk flexed Gait velocity: Decreased Gait velocity interpretation: <1.8 ft/sec, indicate of risk for recurrent falls General Gait Details: VC's for improved posture and forward gaze. Progressed to supervision level by end of session.   Stairs            Wheelchair Mobility    Modified Rankin (Stroke Patients Only)       Balance Overall balance assessment: Mild deficits observed, not formally tested                                           Pertinent Vitals/Pain Pain Assessment: 0-10 Pain Score: 5  Pain Location: back Pain Descriptors / Indicators: Discomfort Pain Intervention(s): Repositioned;Monitored during session    Home Living Family/patient expects to be discharged to:: Private residence Living Arrangements: Spouse/significant other Available Help at Discharge: Family;Available 24 hours/day Type of Home: House Home Access: Stairs to enter   [employer] of Steps: 1 Home Layout: Two level;Able to live on main level with bedroom/bathroom Home Equipment: None      Prior Function Level of Independence: Independent         Comments: Supervision level for getting in/out of tub     Hand Dominance   Dominant Hand: Right    Extremity/Trunk Assessment   Upper Extremity Assessment Upper Extremity Assessment: Overall WFL for tasks assessed    Lower Extremity Assessment Lower Extremity Assessment: Generalized weakness    Cervical / Trunk Assessment Cervical / Trunk Assessment: Other exceptions Cervical / Trunk Exceptions: s/p back sx  Communication  Communication: No difficulties  Cognition Arousal/Alertness: Awake/alert Behavior During Therapy: WFL for tasks assessed/performed Overall Cognitive Status: Within Functional Limits for tasks assessed                                        General Comments       Exercises     Assessment/Plan    PT Assessment Patient needs continued PT services  PT Problem List Decreased strength;Decreased activity tolerance;Decreased balance;Decreased mobility;Decreased knowledge of use of DME;Decreased safety awareness;Decreased knowledge of precautions;Pain       PT Treatment Interventions DME instruction;Gait training;Therapeutic activities;Functional mobility training;Therapeutic exercise;Patient/family education;Neuromuscular re-education    PT Goals (Current goals can be found in the Care Plan section)  Acute Rehab PT Goals Patient Stated Goal: to go home PT Goal Formulation: With patient Time For Goal Achievement: 01/27/19 Potential to Achieve Goals: Good    Frequency Min 5X/week   Barriers to discharge        Co-evaluation               AM-PAC PT "6 Clicks" Mobility  Outcome Measure Help needed turning from your back to your side while in a flat bed without using bedrails?: None Help needed moving from lying on your back to sitting on the side of a flat bed without using bedrails?: None Help needed moving to and from a bed to a chair (including a wheelchair)?: A Little Help needed standing up from a chair using your arms (e.g., wheelchair or bedside chair)?: A Little Help needed to walk in hospital room?: A Little Help needed climbing 3-5 steps with a railing? : A Little 6 Click Score: 20    End of Session Equipment Utilized During Treatment: Gait belt Activity Tolerance: Patient tolerated treatment well Patient left: in bed;with call bell/phone within reach Nurse Communication: Mobility status PT Visit Diagnosis: Unsteadiness on feet (R26.81);Pain Pain - part of body: (back)    Time: 8882-8003 PT Time Calculation (min) (ACUTE ONLY): 21 min   Charges:   PT Evaluation $PT Eval Moderate Complexity: 1 Mod          Rolinda Roan, PT, DPT Acute Rehabilitation Services Pager: [phone] Office: [phone]    Thelma Comp 01/20/2019, 9:55 AM

## 2019-01-20 NOTE — Evaluation (Signed)
Occupational Therapy Evaluation Patient Details Name: Isabel Hardy MRN: [id number] DOB: 1950-12-16 Today's Date: 01/20/2019    History of Present Illness Pt is a 68 yo female s/p Bilateral L4-5 laminectomy and bilateral L3-4 laminotomies/foraminotomies. PMHx: TIA, HTN, afib.   Clinical Impression   Pt PTA: living with family and reports supervisionA for tub transfer, but otherwise, independent with ADL and mobility. Pt currently performing LB dressing with figure 4 technique and education provided on LB AE. Pt with good carryover skills after demo. Pt simulating tub transfer well. Back handout provided. Pt educated to: set an alarm at night for medication, avoid sitting for long periods of time, correct bed positioning for sleeping, correct sequence for bed mobility, avoiding lifting more than 5 pounds and never wash directly over incision. All education is complete and patient indicates understanding. Pt requires 3in1 in order to protect back precautions for low commode and to double as a shower chair. Pt does not require continued OT skilled services. OT signing off.    Follow Up Recommendations  No OT follow up;Supervision - Intermittent    Equipment Recommendations  3 in 1 bedside commode    Recommendations for Other Services       Precautions / Restrictions Precautions Precautions: Back Precaution Booklet Issued: Yes (comment) Precaution Comments: verbally discussed handout Restrictions Weight Bearing Restrictions: No      Mobility Bed Mobility Overal bed mobility: Needs Assistance Bed Mobility: Supine to Sit;Sit to Supine;Rolling Rolling: Supervision   Supine to sit: Supervision Sit to supine: Supervision   General bed mobility comments: supervisionA for log roll technique  Transfers Overall transfer level: Needs assistance Equipment used: None Transfers: Sit to/from Stand Sit to Stand: Supervision         General transfer comment: cues to avoid bending  over for sit to stand    Balance Overall balance assessment: Mild deficits observed, not formally tested                                         ADL either performed or assessed with clinical judgement   ADL Overall ADL's : At baseline;Modified independent                                       General ADL Comments: Pt using figure 4 and reacher for current LB ADL needs. Pt education for hip kit provided for LB ADL needs. Pt performing UB ADL with set-upA. 2 cups provided for grooming at sink to avoid bending. Pt required 1 cue to avoid bending when spitting into sink.     Vision Baseline Vision/History: No visual deficits Vision Assessment?: No apparent visual deficits     Perception     Praxis      Pertinent Vitals/Pain Pain Assessment: 0-10 Pain Score: 5  Pain Location: back Pain Descriptors / Indicators: Discomfort Pain Intervention(s): Repositioned;Monitored during session     Hand Dominance Right   Extremity/Trunk Assessment Upper Extremity Assessment Upper Extremity Assessment: Overall WFL for tasks assessed   Lower Extremity Assessment Lower Extremity Assessment: Generalized weakness   Cervical / Trunk Assessment Cervical / Trunk Assessment: Other exceptions Cervical / Trunk Exceptions: s/p back sx   Communication Communication Communication: No difficulties   Cognition Arousal/Alertness: Awake/alert Behavior During Therapy: WFL for tasks assessed/performed Overall Cognitive Status: Within Functional Limits  for tasks assessed                                     General Comments  Pt reported feeling anxious prior to OT visit as her family could not be here 24/7, but she stated that "everyone has been so helpful."    Exercises     Shoulder Instructions      Home Living Family/patient expects to be discharged to:: Private residence Living Arrangements: Spouse/significant other Available Help at  Discharge: Family;Available 24 hours/day Type of Home: House Home Access: Stairs to enter [employer] of Steps: 1   Home Layout: Two level;Able to live on main level with bedroom/bathroom Alternate Level Stairs-Number of Steps: full flight   Bathroom Shower/Tub: Teacher, early years/pre: Standard     Home Equipment: None          Prior Functioning/Environment Level of Independence: Independent        Comments: Supervision level for getting in/out of tub        OT Problem List: Decreased strength;Decreased activity tolerance;Impaired balance (sitting and/or standing);Decreased safety awareness;Pain      OT Treatment/Interventions:      OT Goals(Current goals can be found in the care plan section) Acute Rehab OT Goals Patient Stated Goal: to go home OT Goal Formulation: With patient  OT Frequency:     Barriers to D/C:            Co-evaluation              AM-PAC OT "6 Clicks" Daily Activity     Outcome Measure Help from another person eating meals?: None Help from another person taking care of personal grooming?: A Little Help from another person toileting, which includes using toliet, bedpan, or urinal?: A Little Help from another person bathing (including washing, rinsing, drying)?: A Little Help from another person to put on and taking off regular upper body clothing?: None Help from another person to put on and taking off regular lower body clothing?: A Little 6 Click Score: 20   End of Session Nurse Communication: Mobility status;Other (comment)(need for [facility])  Activity Tolerance: Patient tolerated treatment well Patient left: in chair;with call bell/phone within reach  OT Visit Diagnosis: Unsteadiness on feet (R26.81);Muscle weakness (generalized) (M62.81)                Time: 7564-3329 OT Time Calculation (min): 39 min Charges:  OT General Charges $OT Visit: 1 Visit OT Evaluation $OT Eval Moderate Complexity: 1 Mod OT  Treatments $Self Care/Home Management : 23-37 mins  Ebony Hail Harold Hedge) Marsa Aris OTR/L Acute Rehabilitation Services Pager: [phone] Office: [place] 01/20/2019, 9:22 AM

## 2019-01-20 NOTE — Discharge Summary (Signed)
Physician Discharge Summary  Patient ID: Isabel Hardy MRN: [id number] DOB/AGE: 1951-04-15 68 y.o.  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 She has done well post operatively. She has worked with both physical and occupational therapies. Her pain is well controlled. She is ready for discharge home.   Consults: rehabilitation medicine  Significant Diagnostic Studies: radiology: Dg Lumbar Spine 1 View  Result Date: 01/19/2019 CLINICAL DATA:  L3 through L5 laminectomy. EXAM: LUMBAR SPINE - 1 VIEW COMPARISON:  Radiographs of December 31, 2018. MRI of October 14, 2018. FINDINGS: Single intraoperative cross-table lateral projection of the lumbar spine was obtained. Surgical probe is seen at approximately the L3-4 level. IMPRESSION: Surgical localization as described above. Electronically Signed   By: Marijo Conception M.D.   On: 01/19/2019 15:41     Treatments: Bilateral L4-5 laminectomy and bilateral L3-4 laminotomies/foraminotomies to decompress the bilateral L4 and L5 nerve roots using micro-dissection  Discharge Exam: Blood pressure (!) 106/58, pulse 62, temperature 98.1 F (36.7 C), temperature source Oral, resp. rate 18, height 5' (1.524 m), weight 81.2 kg, SpO2 98 %.   Alert and oriented x 4 PERRLA CN II-XII grossly intact MAE, Strength and sensation intact Incision is covered with Honeycomb dressing and Steri Strips; Dressing is clean, dry, and intact  Disposition: Discharge disposition: 01-Home or Self Care        Allergies as of 01/20/2019   No Known  Allergies     Medication List    STOP taking these medications   traMADol 50 MG tablet Commonly known as: ULTRAM     TAKE these medications   acetaminophen 650 MG CR tablet Commonly known as: TYLENOL Take 650 mg by mouth every 8 (eight) hours as needed for pain.   ALPRAZolam 0.5 MG tablet Commonly known as: XANAX Take 0.5 mg by mouth daily as needed for anxiety.   atorvastatin 40 MG tablet Commonly known as: LIPITOR Take 40 mg by mouth every evening.   BIOFREEZE EX Apply 1 application topically daily as needed (pain).   Calcium 1200 1200-1000 MG-UNIT Chew Chew 1 tablet by mouth daily.   cyclobenzaprine 10 MG tablet Commonly known as: FLEXERIL Take 1 tablet (10 mg total) by mouth 3 (three) times daily as needed for muscle spasms.   Dialyvite Vitamin D 5000 125 MCG (5000 UT) capsule Generic drug: Cholecalciferol Take 5,000 Units by mouth daily.   docusate sodium 100 MG capsule Commonly known as: COLACE Take 1 capsule (100 mg total) by mouth 2 (two) times daily.   Eliquis 5 MG Tabs tablet Generic drug: apixaban Take 5 mg by mouth 2 (two) times a day.   furosemide 20 MG tablet Commonly known as: LASIX Take 20 mg by mouth daily.   gabapentin 300 MG capsule Commonly known as: NEURONTIN Take 300 mg by mouth 3 (three) times daily as needed (pain).   hydrochlorothiazide 12.5 MG tablet Commonly known as: HYDRODIURIL Take 12.5 mg by mouth daily.   lidocaine 5 % Commonly known as: LIDODERM Place 1 patch onto the skin daily as needed (pain). Remove & Discard patch within 12 hours or as directed by MD   Magnesium Oxide 250 MG Tabs Take  250 mg by mouth 2 (two) times daily.   metoprolol tartrate 25 MG tablet Commonly known as: LOPRESSOR Take 12.5 mg by mouth 2 (two) times a day.   Oxycodone HCl 10 MG Tabs Take 1 tablet (10 mg total) by mouth every 4 (four) hours as needed for severe pain ((score 7 to 10)).   potassium chloride SA 20 MEQ tablet Commonly known  as: KLOR-CON Take 40 mEq by mouth daily.   risedronate 5 MG tablet Commonly known as: ACTONEL Take 5 mg by mouth daily before breakfast. with water on empty stomach, nothing by mouth or lie down for next 30 minutes.   risedronate 35 MG tablet Commonly known as: ACTONEL Take 35 mg by mouth every Saturday. with water on empty stomach, nothing by mouth or lie down for next 30 minutes.   telmisartan-hydrochlorothiazide 40-12.5 MG tablet Commonly known as: MICARDIS HCT Take 1 tablet by mouth daily.   traZODone 50 MG tablet Commonly known as: DESYREL Take 50 mg by mouth at bedtime as needed for sleep.            Durable Medical Equipment  (From admission, onward)         Start     Ordered   01/20/19 1054  For home use only DME 3 n 1  Once     01/20/19 1059   01/20/19 0845  For home use only DME 3 n 1  Once     01/20/19 1610         Follow-up Information    Tressie Stalker, MD. Schedule an appointment as soon as possible for a visit in 2 week(s).   Specialty: Neurosurgery Contact information: [address] [place] [address] [phone]           Signed: Floreen Comber 01/20/2019, 11:05 AM

## 2019-01-25 ENCOUNTER — Ambulatory Visit (INDEPENDENT_AMBULATORY_CARE_PROVIDER_SITE_OTHER): Payer: Medicare Other | Admitting: Cardiology

## 2019-01-25 ENCOUNTER — Other Ambulatory Visit: Payer: Self-pay

## 2019-01-25 ENCOUNTER — Encounter: Payer: Self-pay | Admitting: Cardiology

## 2019-01-25 VITALS — BP 136/84 | HR 88 | Ht 60.0 in | Wt 185.6 lb

## 2019-01-25 DIAGNOSIS — Z9889 Other specified postprocedural states: Secondary | ICD-10-CM | POA: Diagnosis not present

## 2019-01-25 DIAGNOSIS — I10 Essential (primary) hypertension: Secondary | ICD-10-CM | POA: Diagnosis not present

## 2019-01-25 DIAGNOSIS — E785 Hyperlipidemia, unspecified: Secondary | ICD-10-CM

## 2019-01-25 DIAGNOSIS — I480 Paroxysmal atrial fibrillation: Secondary | ICD-10-CM

## 2019-01-25 NOTE — Patient Instructions (Signed)
Medication Instructions: Your physician recommends that you continue on your current medications as directed. Please refer to the Current Medication list given to you today.  If you need a refill on your cardiac medications before your next appointment, please call your pharmacy.   Lab work: NONE If you have labs (blood work) drawn today and your tests are completely normal, you will receive your results only by: . MyChart Message (if you have MyChart) OR . A paper copy in the mail If you have any lab test that is abnormal or we need to change your treatment, we will call you to review the results.  Testing/Procedures: NONE  Follow-Up: At [facility], you and your health needs are our priority.  As part of our continuing mission to provide you with exceptional heart care, we have created designated Provider Care Teams.  These Care Teams include your primary Cardiologist (physician) and Advanced Practice Providers (APPs -  Physician Assistants and Nurse Practitioners) who all work together to provide you with the care you need, when you need it. You will need a follow up appointment in 2 months.    

## 2019-01-25 NOTE — Progress Notes (Signed)
Cardiology Office Note:    Date:  01/25/2019   ID:  [name], DOB 28-Mar-1951, MRN [id number]  PCP:  Serita Grammes, MD  Cardiologist:  Jenean Lindau, MD   Referring MD: Serita Grammes, MD    ASSESSMENT:    1. Paroxysmal atrial fibrillation (HCC)   2. Essential hypertension   3. S/P MVR (mitral valve repair)   4. Dyslipidemia    PLAN:    In order of problems listed above:  1. Coronary artery disease: Secondary prevention stressed with the patient.  Importance of compliance with diet and medication stressed and she vocalized understanding. 2. Essential hypertension: Her blood pressure stable 3. Paroxysmal atrial fibrillation:I discussed with the patient atrial fibrillation, disease process. Management and therapy including rate and rhythm control, anticoagulation benefits and potential risks were discussed extensively with the patient. Patient had multiple questions which were answered to patient's satisfaction.  I gave her an option of therapy with Multitak as she has paroxysms of atrial fibrillation on the pacemaker.  She wants to wait till December because she has had back surgery and is healing from it. 4. She will be seen in the month of December or earlier if she has any concerns.  Diet was discussed for dyslipidemia and weight reduction was stressed and she promises to do so.   Medication Adjustments/Labs and Tests Ordered: Current medicines are reviewed at length with the patient today.  Concerns regarding medicines are outlined above.  No orders of the defined types were placed in this encounter.  No orders of the defined types were placed in this encounter.    No chief complaint on file.    History of Present Illness:    Isabel Hardy is a 68 y.o. female.  Patient has undergone back surgery because of a spur which was bothering her pretty bad.  She has episodes approximately fibrillation, coronary artery disease he is post mitral valve repair and  CABG surgery in the remote past.  She denies any chest pain orthopnea PND or palpitations.  At the time of my evaluation, the patient is alert awake oriented and in no distress.  Past Medical History:  Diagnosis Date  . Atrial fibrillation ([place])   . Dysrhythmia   . H/O mitral valve repair   . Hypertension   . Presence of permanent cardiac pacemaker   . Stroke [facility])    per patient, "mini stroke in 2005/06"    Past Surgical History:  Procedure Laterality Date  . ABDOMINAL HYSTERECTOMY    . APPENDECTOMY    . CATARACT EXTRACTION W/ INTRAOCULAR LENS IMPLANT Left   . CESAREAN SECTION    . CHOLECYSTECTOMY    . EYE SURGERY    . GALLBLADDER SURGERY    . LUMBAR LAMINECTOMY/DECOMPRESSION MICRODISCECTOMY N/A 01/19/2019   Procedure: LUMBAR LAMINECTOMY AND FORAMINOTOMY LUMBAR THREE- LUMBAR FOUR, LUMBAR FOUR- LUMBAR FIVE;  Surgeon: Newman Pies, MD;  Location: [place];  Service: Neurosurgery;  Laterality: N/A;  LUMBAR LAMINECTOMY AND FORAMINOTOMY LUMBAR THREE- LUMBAR FOUR, LUMBAR FOUR- LUMBAR FIVE  . open heart surgery    . TONSILLECTOMY      Current Medications: Current Meds  Medication Sig  . acetaminophen (TYLENOL) 650 MG CR tablet Take 650 mg by mouth every 8 (eight) hours as needed for pain.  Marland Kitchen ALPRAZolam (XANAX) 0.5 MG tablet Take 0.5 mg by mouth daily as needed for anxiety.  Marland Kitchen apixaban (ELIQUIS) 5 MG TABS tablet Take 5 mg by mouth 2 (two) times a day.   Marland Kitchen  atorvastatin (LIPITOR) 40 MG tablet Take 40 mg by mouth every evening.   . Calcium Carbonate-Vit D-Min (CALCIUM 1200) 1200-1000 MG-UNIT CHEW Chew 1 tablet by mouth daily.  . Cholecalciferol (DIALYVITE VITAMIN D 5000) 125 MCG (5000 UT) capsule Take 5,000 Units by mouth daily.  . cyclobenzaprine (FLEXERIL) 10 MG tablet Take 1 tablet (10 mg total) by mouth 3 (three) times daily as needed for muscle spasms.  Marland Kitchen docusate sodium (COLACE) 100 MG capsule Take 1 capsule (100 mg total) by mouth 2 (two) times daily.  . furosemide (LASIX) 20 MG  tablet Take 20 mg by mouth daily.  Marland Kitchen gabapentin (NEURONTIN) 300 MG capsule Take 300 mg by mouth 3 (three) times daily as needed (pain).   . hydrochlorothiazide (HYDRODIURIL) 12.5 MG tablet Take 12.5 mg by mouth daily.  Marland Kitchen lidocaine (LIDODERM) 5 % Place 1 patch onto the skin daily as needed (pain). Remove & Discard patch within 12 hours or as directed by MD  . Magnesium Oxide 250 MG TABS Take 250 mg by mouth 2 (two) times daily.   . Menthol, Topical Analgesic, (BIOFREEZE EX) Apply 1 application topically daily as needed (pain).  . metoprolol tartrate (LOPRESSOR) 25 MG tablet Take 12.5 mg by mouth 2 (two) times a day.   . potassium chloride SA (K-DUR,KLOR-CON) 20 MEQ tablet Take 40 mEq by mouth daily.   . risedronate (ACTONEL) 35 MG tablet Take 35 mg by mouth every Saturday. with water on empty stomach, nothing by mouth or lie down for next 30 minutes.  . risedronate (ACTONEL) 5 MG tablet Take 5 mg by mouth daily before breakfast. with water on empty stomach, nothing by mouth or lie down for next 30 minutes.  Marland Kitchen telmisartan-hydrochlorothiazide (MICARDIS HCT) 40-12.5 MG tablet Take 1 tablet by mouth daily.  . traZODone (DESYREL) 50 MG tablet Take 50 mg by mouth at bedtime as needed for sleep.      Allergies:   Patient has no known allergies.   Social History   Socioeconomic History  . Marital status: Married    Spouse name: Not on file  . Number of children: Not on file  . Years of education: Not on file  . Highest education level: Not on file  Occupational History  . Not on file  Social Needs  . Financial resource strain: Not on file  . Food insecurity    Worry: Not on file    Inability: Not on file  . Transportation needs    Medical: Not on file    Non-medical: Not on file  Tobacco Use  . Smoking status: Never Smoker  . Smokeless tobacco: Never Used  Substance and Sexual Activity  . Alcohol use: No  . Drug use: No  . Sexual activity: Not on file  Lifestyle  . Physical activity     Days per week: Not on file    Minutes per session: Not on file  . Stress: Not on file  Relationships  . Social Musician on phone: Not on file    Gets together: Not on file    Attends religious service: Not on file    Active member of club or organization: Not on file    Attends meetings of clubs or organizations: Not on file    Relationship status: Not on file  Other Topics Concern  . Not on file  Social History Narrative  . Not on file     Family History: The patient's family history includes Heart attack  in her father; Hypertension in her mother; Kidney disease in her mother.  ROS:   Please see the history of present illness.    All other systems reviewed and are negative.  EKGs/Labs/Other Studies Reviewed:    The following studies were reviewed today: tudy Highlights    Nuclear stress EF: 73%.  The left ventricular ejection fraction is hyperdynamic (>65%).  T wave inversion of 0.5 mm was noted during stress. Same as baseline  There was no ST segment deviation noted during stress.  Defect 1: There is a small defect of mild severity present in the basal anteroseptal location.  The study is normal.  This is a low risk study.   The fixed defect in the anteroseptal wall is likely secondary to soft tissue attenuation.    EVENT MONITOR REPORT:   Patient was monitored from 11/04/2017 to 12/03/2017  Indication:                    Paroxysmal atrial fibrillation Ordering physician:  Garwin Brothers, MD  Referring physician:        Garwin Brothers, MD    Baseline rhythm: Sinus rhythm with a heart rate of 68 on the baseline strip  Minimum heart rate: 53 BPM.   Maximal heart rate 167 BPM.  Atrial arrhythmia: Atrial flutter with variable conduction and at times the rhythm is suggestive of atrial fibrillation  Ventricular arrhythmia: None significant  Conduction abnormality: Patient had a 4.2-second pause at 7 12 in the morning at that time  this prolonged R-R interval was seen with the baseline rhythm of atrial flutter  Symptoms: Symptoms did not correlate with any findings on the monitor   Conclusion:  Atrial fibrillation with rapid ventricular rate at times.  This is paroxysmal in nature. Atrial flutter was seen with variable control of ventricular rate. The patient also had a prolonged R-R interval in the early hours of the morning.  This is been documented above.   Interpreting  cardiologist: Garwin Brothers, MD  Date: 12/08/2017 11:19 AM   Study Conclusions  - Left ventricle: The cavity size was normal. Wall thickness was   increased in a pattern of mild LVH. Systolic function was normal.   The estimated ejection fraction was in the range of 50% to 55%.   Wall motion was normal; there were no regional wall motion   abnormalities. Doppler parameters are consistent with a   reversible restrictive pattern, indicative of decreased left   ventricular diastolic compliance and/or increased left atrial   pressure (grade 3 diastolic dysfunction). Doppler parameters are   consistent with high ventricular filling pressure. - Mitral valve: Severely thickened annulus. Structurally normal   valve. Good functional result with MV repair reported. Valve area   by continuity equation (using LVOT flow): 1.54 cm^2. - Left atrium: The atrium was severely dilated. - Right atrium: The atrium was mildly dilated. - Atrial septum: The septum bowed from left to right, consistent   with increased left atrial pressure. - Pulmonary arteries: PA peak pressure: 31 mm Hg (S). - Pericardium, extracardiac: A trivial pericardial effusion was   identified posterior to the heart.  Recent Labs: 01/17/2019: BUN 22; Creatinine, Ser 1.66; Hemoglobin 14.7; Platelets 221; Potassium 3.9; Sodium 143  Recent Lipid Panel    Component Value Date/Time   CHOL 151 12/30/2016 1155   TRIG 100 12/30/2016 1155   HDL 55 12/30/2016 1155   CHOLHDL 2.7  12/30/2016 1155   LDLCALC 76 12/30/2016 1155  Physical Exam:    VS:  BP 136/84 (BP Location: Left Arm, Patient Position: Sitting, Cuff Size: Normal)   Pulse 88   Ht 5' (1.524 m)   Wt 185 lb 9.6 oz (84.2 kg)   SpO2 100%   BMI 36.25 kg/m     Wt Readings from Last 3 Encounters:  01/25/19 185 lb 9.6 oz (84.2 kg)  01/19/19 179 lb (81.2 kg)  01/17/19 179 lb (81.2 kg)     GEN: Patient is in no acute distress HEENT: Normal NECK: No JVD; No carotid bruits LYMPHATICS: No lymphadenopathy CARDIAC: Hear sounds regular, 2/6 systolic murmur at the apex. RESPIRATORY:  Clear to auscultation without rales, wheezing or rhonchi  ABDOMEN: Soft, non-tender, non-distended MUSCULOSKELETAL:  No edema; No deformity  SKIN: Warm and dry NEUROLOGIC:  Alert and oriented x 3 PSYCHIATRIC:  Normal affect   Signed, Garwin Brothersajan R Philomina Leon, MD  01/25/2019 8:38 AM    [facility]

## 2019-03-28 ENCOUNTER — Telehealth (INDEPENDENT_AMBULATORY_CARE_PROVIDER_SITE_OTHER): Payer: Medicare Other | Admitting: Cardiology

## 2019-03-28 ENCOUNTER — Other Ambulatory Visit: Payer: Self-pay

## 2019-03-28 ENCOUNTER — Encounter: Payer: Self-pay | Admitting: Cardiology

## 2019-03-28 VITALS — BP 128/74 | HR 74 | Ht 60.0 in | Wt 176.6 lb

## 2019-03-28 DIAGNOSIS — E785 Hyperlipidemia, unspecified: Secondary | ICD-10-CM

## 2019-03-28 DIAGNOSIS — I480 Paroxysmal atrial fibrillation: Secondary | ICD-10-CM | POA: Diagnosis not present

## 2019-03-28 DIAGNOSIS — I484 Atypical atrial flutter: Secondary | ICD-10-CM

## 2019-03-28 DIAGNOSIS — I10 Essential (primary) hypertension: Secondary | ICD-10-CM

## 2019-03-28 DIAGNOSIS — Z9889 Other specified postprocedural states: Secondary | ICD-10-CM

## 2019-03-28 NOTE — Patient Instructions (Signed)

## 2019-03-28 NOTE — Progress Notes (Signed)
Virtual Visit via Video Note   This visit type was conducted due to national recommendations for restrictions regarding the COVID-19 Pandemic (e.g. social distancing) in an effort to limit this patient's exposure and mitigate transmission in our community.  Due to her co-morbid illnesses, this patient is at least at moderate risk for complications without adequate follow up.  This format is felt to be most appropriate for this patient at this time.  All issues noted in this document were discussed and addressed.  A limited physical exam was performed with this format.  Please refer to the patient's chart for her consent to telehealth for [facility].   Date:  03/28/2019   ID:  Isabel Hardy Dicocco, DOB 09/05/1950, MRN [id number]  Patient Location: Home Provider Location: Office  PCP:  Buckner MaltaBurgart, Jennifer, MD  Cardiologist:  No primary care provider on file.  Electrophysiologist:  None   Evaluation Performed:  Follow-Up Visit  Chief Complaint: Paroxysmal atrial fibrillation  History of Present Illness:    Isabel FittingCarolyn Hardy Cromwell is a 68 y.o. female with paroxysmal atrial fibrillation, essential hypertension, dyslipidemia and post mitral valve repair.  She denies any problems at this time and takes care of activities of daily living.  No chest pain orthopnea or PND.  She has had some back surgery and healing well from it and ambulating well.  She denies any palpitations or any syncopal episodes or dizziness.  Her pacemaker issues are [facility] primary electrophysiologist and [facility].  The patient does not have symptoms concerning for COVID-19 infection (fever, chills, cough, or new shortness of breath).    Past Medical History:  Diagnosis Date  . Atrial fibrillation (HCC)   . Dysrhythmia   . H/O mitral valve repair   . Hypertension   . Presence of permanent cardiac pacemaker   . Stroke [facility](HCC)    per patient, "mini stroke in 2005/06"   Past Surgical History:  Procedure Laterality  Date  . ABDOMINAL HYSTERECTOMY    . APPENDECTOMY    . CATARACT EXTRACTION W/ INTRAOCULAR LENS IMPLANT Left   . CESAREAN SECTION    . CHOLECYSTECTOMY    . EYE SURGERY    . GALLBLADDER SURGERY    . LUMBAR LAMINECTOMY/DECOMPRESSION MICRODISCECTOMY N/A 01/19/2019   Procedure: LUMBAR LAMINECTOMY AND FORAMINOTOMY LUMBAR THREE- LUMBAR FOUR, LUMBAR FOUR- LUMBAR FIVE;  Surgeon: Tressie StalkerJenkins, Jeffrey, MD;  [facility];  Service: Neurosurgery;  Laterality: N/A;  LUMBAR LAMINECTOMY AND FORAMINOTOMY LUMBAR THREE- LUMBAR FOUR, LUMBAR FOUR- LUMBAR FIVE  . open heart surgery    . TONSILLECTOMY       Current Meds  Medication Sig  . acetaminophen (TYLENOL) 650 MG CR tablet Take 650 mg by mouth every 8 (eight) hours as needed for pain.  Marland Kitchen. ALPRAZolam (XANAX) 0.5 MG tablet Take 0.5 mg by mouth daily as needed for anxiety.  Marland Kitchen. apixaban (ELIQUIS) 5 MG TABS tablet Take 5 mg by mouth 2 (two) times a day.   Marland Kitchen. atorvastatin (LIPITOR) 40 MG tablet Take 40 mg by mouth every evening.   . Calcium Carbonate-Vit D-Min (CALCIUM 1200) 1200-1000 MG-UNIT CHEW Chew 1 tablet by mouth daily.  . Cholecalciferol (DIALYVITE VITAMIN D 5000) 125 MCG (5000 UT) capsule Take 2,000 Units by mouth daily.   . cyclobenzaprine (FLEXERIL) 10 MG tablet Take 1 tablet (10 mg total) by mouth 3 (three) times daily as needed for muscle spasms.  Marland Kitchen. docusate sodium (COLACE) 100 MG capsule Take 1 capsule (100 mg total) by mouth 2 (two)  times daily.  . furosemide (LASIX) 20 MG tablet Take 20 mg by mouth daily.  . hydrochlorothiazide (HYDRODIURIL) 12.5 MG tablet Take 12.5 mg by mouth daily.  Marland Kitchen lidocaine (LIDODERM) 5 % Place 1 patch onto the skin daily as needed (pain). Remove & Discard patch within 12 hours or as directed by MD  . Magnesium Oxide 250 MG TABS Take 250 mg by mouth 2 (two) times daily.   . Menthol, Topical Analgesic, (BIOFREEZE EX) Apply 1 application topically daily as needed (pain).  . metoprolol tartrate (LOPRESSOR) 25 MG tablet Take 12.5  mg by mouth 2 (two) times a day.   . potassium chloride SA (K-DUR,KLOR-CON) 20 MEQ tablet Take 40 mEq by mouth daily.   Marland Kitchen telmisartan-hydrochlorothiazide (MICARDIS HCT) 40-12.5 MG tablet Take 1 tablet by mouth daily.  . traZODone (DESYREL) 50 MG tablet Take 50 mg by mouth at bedtime as needed for sleep.      Allergies:   Patient has no known allergies.   Social History   Tobacco Use  . Smoking status: Never Smoker  . Smokeless tobacco: Never Used  Substance Use Topics  . Alcohol use: No  . Drug use: No     Family Hx: The patient's family history includes Heart attack in her father; Hypertension in her mother; Kidney disease in her mother.  ROS:   Please see the history of present illness.    As mentioned above  All other systems reviewed and are negative.   Prior CV studies:   The following studies were reviewed today:  I reviewed notes from [facility] and recent evaluations.  Labs/Other Tests and Data Reviewed:    EKG:  No ECG reviewed.  Recent Labs: 01/17/2019: BUN 22; Creatinine, Ser 1.66; Hemoglobin 14.7; Platelets 221; Potassium 3.9; Sodium 143   Recent Lipid Panel Lab Results  Component Value Date/Time   CHOL 151 12/30/2016 11:55 AM   TRIG 100 12/30/2016 11:55 AM   HDL 55 12/30/2016 11:55 AM   CHOLHDL 2.7 12/30/2016 11:55 AM   LDLCALC 76 12/30/2016 11:55 AM    Wt Readings from Last 3 Encounters:  03/28/19 176 lb 9.6 oz (80.1 kg)  01/25/19 185 lb 9.6 oz (84.2 kg)  01/19/19 179 lb (81.2 kg)     Objective:    Vital Signs:  Pulse 74   Ht 5' (1.524 m)   Wt 176 lb 9.6 oz (80.1 kg)   BMI 34.49 kg/m    VITAL SIGNS:  reviewed  ASSESSMENT & PLAN:    1. Paroxysmal atrial fibrillation:I discussed with the patient atrial fibrillation, disease process. Management and therapy including rate and rhythm control, anticoagulation benefits and potential risks were discussed extensively with the patient. Patient had multiple questions which were answered to  patient's satisfaction. 2. I reviewed records from [facility] extensively including pacemaker evaluation and office notes by electrophysiologist Dr. Excell Seltzer 3. Essential hypertension: Blood pressure is stable and dietary issues were discussed 4. Mixed dyslipidemia: Lipids followed by primary care physician.  Lipids focused on diet was discussed.  Importance of regular exercise stressed. 5. Patient is post mitral valve replacement.  Echocardiogram from last evaluation was reviewed.  Patient has severe left atrial dilatation.  These issues were discussed and questions were answered to her satisfaction. 6. Patient has multiple complex issues and total time for this evaluation was 35 minutes.  Including record review from other hospital and evaluation.  COVID-19 Education: The signs and symptoms of COVID-19 were discussed with the patient and how to seek  care for testing (follow up with PCP or arrange E-visit).  The importance of social distancing was discussed today.  Time:   Today, I have spent 35 minutes with the patient with telehealth technology discussing the above problems.     Medication Adjustments/Labs and Tests Ordered: Current medicines are reviewed at length with the patient today.  Concerns regarding medicines are outlined above.   Tests Ordered: No orders of the defined types were placed in this encounter.   Medication Changes: No orders of the defined types were placed in this encounter.   Follow Up:  In Person in 6 month(s)  Signed, Jenean Lindau, MD  03/28/2019 8:30 AM    [place]

## 2019-05-19 DIAGNOSIS — Z45018 Encounter for adjustment and management of other part of cardiac pacemaker: Secondary | ICD-10-CM | POA: Diagnosis not present

## 2019-07-15 DIAGNOSIS — I484 Atypical atrial flutter: Secondary | ICD-10-CM | POA: Diagnosis not present

## 2019-07-15 DIAGNOSIS — Z8673 Personal history of transient ischemic attack (TIA), and cerebral infarction without residual deficits: Secondary | ICD-10-CM | POA: Diagnosis not present

## 2019-07-15 DIAGNOSIS — Z950 Presence of cardiac pacemaker: Secondary | ICD-10-CM | POA: Diagnosis not present

## 2019-07-15 DIAGNOSIS — I495 Sick sinus syndrome: Secondary | ICD-10-CM | POA: Diagnosis not present

## 2019-07-15 DIAGNOSIS — Z7901 Long term (current) use of anticoagulants: Secondary | ICD-10-CM | POA: Diagnosis not present

## 2019-07-15 DIAGNOSIS — I5032 Chronic diastolic (congestive) heart failure: Secondary | ICD-10-CM | POA: Diagnosis not present

## 2019-07-15 DIAGNOSIS — Z6833 Body mass index (BMI) 33.0-33.9, adult: Secondary | ICD-10-CM | POA: Diagnosis not present

## 2019-07-15 DIAGNOSIS — I110 Hypertensive heart disease with heart failure: Secondary | ICD-10-CM | POA: Diagnosis not present

## 2019-07-15 DIAGNOSIS — Z7983 Long term (current) use of bisphosphonates: Secondary | ICD-10-CM | POA: Diagnosis not present

## 2019-07-15 DIAGNOSIS — E785 Hyperlipidemia, unspecified: Secondary | ICD-10-CM | POA: Diagnosis not present

## 2019-07-15 DIAGNOSIS — Z45018 Encounter for adjustment and management of other part of cardiac pacemaker: Secondary | ICD-10-CM | POA: Diagnosis not present

## 2019-08-18 DIAGNOSIS — I480 Paroxysmal atrial fibrillation: Secondary | ICD-10-CM | POA: Diagnosis not present

## 2019-08-18 DIAGNOSIS — Z45018 Encounter for adjustment and management of other part of cardiac pacemaker: Secondary | ICD-10-CM | POA: Diagnosis not present

## 2019-09-05 DIAGNOSIS — I110 Hypertensive heart disease with heart failure: Secondary | ICD-10-CM | POA: Diagnosis not present

## 2019-09-05 DIAGNOSIS — I4891 Unspecified atrial fibrillation: Secondary | ICD-10-CM | POA: Diagnosis not present

## 2019-09-05 DIAGNOSIS — I5042 Chronic combined systolic (congestive) and diastolic (congestive) heart failure: Secondary | ICD-10-CM | POA: Diagnosis not present

## 2019-09-05 DIAGNOSIS — Z6831 Body mass index (BMI) 31.0-31.9, adult: Secondary | ICD-10-CM | POA: Diagnosis not present

## 2019-09-05 DIAGNOSIS — M1712 Unilateral primary osteoarthritis, left knee: Secondary | ICD-10-CM | POA: Diagnosis not present

## 2019-09-05 DIAGNOSIS — K5909 Other constipation: Secondary | ICD-10-CM | POA: Diagnosis not present

## 2019-09-05 DIAGNOSIS — Z7901 Long term (current) use of anticoagulants: Secondary | ICD-10-CM | POA: Diagnosis not present

## 2019-09-05 DIAGNOSIS — N183 Chronic kidney disease, stage 3 unspecified: Secondary | ICD-10-CM | POA: Diagnosis not present

## 2019-09-26 DIAGNOSIS — M549 Dorsalgia, unspecified: Secondary | ICD-10-CM | POA: Diagnosis not present

## 2019-09-26 DIAGNOSIS — R030 Elevated blood-pressure reading, without diagnosis of hypertension: Secondary | ICD-10-CM | POA: Diagnosis not present

## 2019-09-26 DIAGNOSIS — M545 Low back pain: Secondary | ICD-10-CM | POA: Diagnosis not present

## 2019-09-26 DIAGNOSIS — Z6832 Body mass index (BMI) 32.0-32.9, adult: Secondary | ICD-10-CM | POA: Diagnosis not present

## 2019-10-10 DIAGNOSIS — M545 Low back pain: Secondary | ICD-10-CM | POA: Diagnosis not present

## 2019-10-10 DIAGNOSIS — I5042 Chronic combined systolic (congestive) and diastolic (congestive) heart failure: Secondary | ICD-10-CM | POA: Diagnosis not present

## 2019-10-10 DIAGNOSIS — M8588 Other specified disorders of bone density and structure, other site: Secondary | ICD-10-CM | POA: Diagnosis not present

## 2019-11-17 DIAGNOSIS — I484 Atypical atrial flutter: Secondary | ICD-10-CM | POA: Diagnosis not present

## 2019-11-22 ENCOUNTER — Encounter: Payer: Self-pay | Admitting: Cardiology

## 2019-11-22 ENCOUNTER — Ambulatory Visit (INDEPENDENT_AMBULATORY_CARE_PROVIDER_SITE_OTHER): Payer: Medicare PPO | Admitting: Cardiology

## 2019-11-22 ENCOUNTER — Other Ambulatory Visit: Payer: Self-pay

## 2019-11-22 VITALS — BP 128/80 | HR 64 | Ht 61.0 in | Wt 169.0 lb

## 2019-11-22 DIAGNOSIS — Z9889 Other specified postprocedural states: Secondary | ICD-10-CM

## 2019-11-22 DIAGNOSIS — I10 Essential (primary) hypertension: Secondary | ICD-10-CM | POA: Diagnosis not present

## 2019-11-22 DIAGNOSIS — I484 Atypical atrial flutter: Secondary | ICD-10-CM | POA: Diagnosis not present

## 2019-11-22 DIAGNOSIS — E785 Hyperlipidemia, unspecified: Secondary | ICD-10-CM

## 2019-11-22 DIAGNOSIS — I480 Paroxysmal atrial fibrillation: Secondary | ICD-10-CM

## 2019-11-22 NOTE — Progress Notes (Signed)
Cardiology Office Note:    Date:  11/22/2019   ID:  Isabel Hardy, DOB Jul 01, 1950, MRN [id number]  PCP:  Buckner Malta, MD  Cardiologist:  Garwin Brothers, MD   Referring MD: Buckner Malta, MD    ASSESSMENT:    1. Atypical atrial flutter (HCC)   2. Essential hypertension   3. Paroxysmal A-fib (HCC)   4. Dyslipidemia   5. S/P MVR (mitral valve repair)    PLAN:    In order of problems listed above:  1. Paroxysmal atrial fibrillation:I discussed with the patient atrial fibrillation, disease process. Management and therapy including rate and rhythm control, anticoagulation benefits and potential risks were discussed extensively with the patient. Patient had multiple questions which were answered to patient's satisfaction. 2. Occasionally she will have readings of A. fib on the pacemaker and get calls from the pacemaker company.  They are asymptomatic.  I reassured her about this.  Emphasized the importance of anticoagulation and she understands 3. Essential hypertension: Blood pressure stable and diet was emphasized 4. Mixed dyslipidemia: She is planning to get blood work by her primary care physician in the next few days and will send me a copy. 5. Post mitral valve repair: Stable at this time 6. Patient will be seen in follow-up appointment in 6 months or earlier if the patient has any concerns    Medication Adjustments/Labs and Tests Ordered: Current medicines are reviewed at length with the patient today.  Concerns regarding medicines are outlined above.  No orders of the defined types were placed in this encounter.  No orders of the defined types were placed in this encounter.    No chief complaint on file.    History of Present Illness:    Isabel Hardy is a 69 y.o. female.  Patient has past medical history approximately fibrillation, flutter, mitral valve repair essential hypertension and permanent pacemaker.  She has had a history of stroke.  She  denies any problems at this time and takes care of activities of daily living.  No chest pain orthopnea or PND.  At the time of my evaluation, the patient is alert awake oriented and in no distress.  Past Medical History:  Diagnosis Date  . Atrial fibrillation (HCC)   . Dysrhythmia   . H/O mitral valve repair   . Hypertension   . Presence of permanent cardiac pacemaker   . Stroke [facility])    per patient, "mini stroke in 2005/06"    Past Surgical History:  Procedure Laterality Date  . ABDOMINAL HYSTERECTOMY    . APPENDECTOMY    . CATARACT EXTRACTION W/ INTRAOCULAR LENS IMPLANT Left   . CESAREAN SECTION    . CHOLECYSTECTOMY    . EYE SURGERY    . GALLBLADDER SURGERY    . LUMBAR LAMINECTOMY/DECOMPRESSION MICRODISCECTOMY N/A 01/19/2019   Procedure: LUMBAR LAMINECTOMY AND FORAMINOTOMY LUMBAR THREE- LUMBAR FOUR, LUMBAR FOUR- LUMBAR FIVE;  Surgeon: Tressie Stalker, MD;  Location: [facility] OR;  Service: Neurosurgery;  Laterality: N/A;  LUMBAR LAMINECTOMY AND FORAMINOTOMY LUMBAR THREE- LUMBAR FOUR, LUMBAR FOUR- LUMBAR FIVE  . open heart surgery    . TONSILLECTOMY      Current Medications: Current Meds  Medication Sig  . acetaminophen (TYLENOL) 650 MG CR tablet Take 650 mg by mouth every 8 (eight) hours as needed for pain.  Marland Kitchen ALPRAZolam (XANAX) 0.5 MG tablet Take 0.5 mg by mouth daily as needed for anxiety.  Marland Kitchen apixaban (ELIQUIS) 5 MG TABS tablet Take 5 mg by mouth 2 (  two) times a day.   Marland Kitchen atorvastatin (LIPITOR) 40 MG tablet Take 40 mg by mouth every evening.   . Calcium Carbonate-Vit D-Min (CALCIUM 1200) 1200-1000 MG-UNIT CHEW Chew 1 tablet by mouth daily.  . Cholecalciferol (DIALYVITE VITAMIN D 5000) 125 MCG (5000 UT) capsule Take 2,000 Units by mouth daily.   . cyclobenzaprine (FLEXERIL) 10 MG tablet Take 1 tablet (10 mg total) by mouth 3 (three) times daily as needed for muscle spasms.  Marland Kitchen docusate sodium (COLACE) 100 MG capsule Take 1 capsule (100 mg total) by mouth 2 (two) times daily.  .  furosemide (LASIX) 20 MG tablet Take 20 mg by mouth daily.  . hydrochlorothiazide (HYDRODIURIL) 12.5 MG tablet Take 12.5 mg by mouth daily.  . Magnesium Oxide 250 MG TABS Take 250 mg by mouth 2 (two) times daily.   . Menthol, Topical Analgesic, (BIOFREEZE EX) Apply 1 application topically daily as needed (pain).  . metoprolol tartrate (LOPRESSOR) 25 MG tablet Take 12.5 mg by mouth 2 (two) times a day.   . potassium chloride SA (K-DUR,KLOR-CON) 20 MEQ tablet Take 40 mEq by mouth daily.   Marland Kitchen telmisartan-hydrochlorothiazide (MICARDIS HCT) 40-12.5 MG tablet Take 1 tablet by mouth daily.  . [DISCONTINUED] risedronate (ACTONEL) 35 MG tablet Take 35 mg by mouth every Saturday. with water on empty stomach, nothing by mouth or lie down for next 30 minutes.     Allergies:   Patient has no known allergies.   Social History   Socioeconomic History  . Marital status: Married    Spouse name: Not on file  . Number of children: Not on file  . Years of education: Not on file  . Highest education level: Not on file  Occupational History  . Not on file  Tobacco Use  . Smoking status: Never Smoker  . Smokeless tobacco: Never Used  Vaping Use  . Vaping Use: Never used  Substance and Sexual Activity  . Alcohol use: No  . Drug use: No  . Sexual activity: Not on file  Other Topics Concern  . Not on file  Social History Narrative  . Not on file   Social Determinants of Health   Financial Resource Strain:   . Difficulty of Paying Living Expenses:   Food Insecurity:   . Worried About Programme researcher, broadcasting/film/video in the Last Year:   . Barista in the Last Year:   Transportation Needs:   . Freight forwarder (Medical):   Marland Kitchen Lack of Transportation (Non-Medical):   Physical Activity:   . Days of Exercise per Week:   . Minutes of Exercise per Session:   Stress:   . Feeling of Stress :   Social Connections:   . Frequency of Communication with Friends and Family:   . Frequency of Social Gatherings  with Friends and Family:   . Attends Religious Services:   . Active Member of Clubs or Organizations:   . Attends Banker Meetings:   Marland Kitchen Marital Status:      Family History: The patient's family history includes Heart attack in her father; Hypertension in her mother; Kidney disease in her mother.  ROS:   Please see the history of present illness.    All other systems reviewed and are negative.  EKGs/Labs/Other Studies Reviewed:    The following studies were reviewed today: I discussed my findings with the patient at length   Recent Labs: 01/17/2019: BUN 22; Creatinine, Ser 1.66; Hemoglobin 14.7; Platelets 221; Potassium 3.9;  Sodium 143  Recent Lipid Panel    Component Value Date/Time   CHOL 151 12/30/2016 1155   TRIG 100 12/30/2016 1155   HDL 55 12/30/2016 1155   CHOLHDL 2.7 12/30/2016 1155   LDLCALC 76 12/30/2016 1155    Physical Exam:    VS:  BP 128/80   Pulse 64   Ht 5\' 1"  (1.549 m)   Wt 169 lb (76.7 kg)   SpO2 98%   BMI 31.93 kg/m     Wt Readings from Last 3 Encounters:  11/22/19 169 lb (76.7 kg)  03/28/19 176 lb 9.6 oz (80.1 kg)  01/25/19 185 lb 9.6 oz (84.2 kg)     GEN: Patient is in no acute distress HEENT: Normal NECK: No JVD; No carotid bruits LYMPHATICS: No lymphadenopathy CARDIAC: Hear sounds regular, 2/6 systolic murmur at the apex. RESPIRATORY:  Clear to auscultation without rales, wheezing or rhonchi  ABDOMEN: Soft, non-tender, non-distended MUSCULOSKELETAL:  No edema; No deformity  SKIN: Warm and dry NEUROLOGIC:  Alert and oriented x 3 PSYCHIATRIC:  Normal affect   Signed, 03/27/19, MD  11/22/2019 4:08 PM    [facility]

## 2019-11-22 NOTE — Patient Instructions (Signed)

## 2019-12-12 DIAGNOSIS — Z9181 History of falling: Secondary | ICD-10-CM | POA: Diagnosis not present

## 2019-12-12 DIAGNOSIS — I4891 Unspecified atrial fibrillation: Secondary | ICD-10-CM | POA: Diagnosis not present

## 2019-12-12 DIAGNOSIS — I5042 Chronic combined systolic (congestive) and diastolic (congestive) heart failure: Secondary | ICD-10-CM | POA: Diagnosis not present

## 2019-12-12 DIAGNOSIS — Z79899 Other long term (current) drug therapy: Secondary | ICD-10-CM | POA: Diagnosis not present

## 2019-12-12 DIAGNOSIS — E7800 Pure hypercholesterolemia, unspecified: Secondary | ICD-10-CM | POA: Diagnosis not present

## 2019-12-12 DIAGNOSIS — N183 Chronic kidney disease, stage 3 unspecified: Secondary | ICD-10-CM | POA: Diagnosis not present

## 2019-12-12 DIAGNOSIS — Z0000 Encounter for general adult medical examination without abnormal findings: Secondary | ICD-10-CM | POA: Diagnosis not present

## 2019-12-12 DIAGNOSIS — E669 Obesity, unspecified: Secondary | ICD-10-CM | POA: Diagnosis not present

## 2019-12-12 DIAGNOSIS — I110 Hypertensive heart disease with heart failure: Secondary | ICD-10-CM | POA: Diagnosis not present

## 2019-12-23 DIAGNOSIS — R17 Unspecified jaundice: Secondary | ICD-10-CM | POA: Diagnosis not present

## 2019-12-23 DIAGNOSIS — Z9049 Acquired absence of other specified parts of digestive tract: Secondary | ICD-10-CM | POA: Diagnosis not present

## 2020-01-18 DIAGNOSIS — Z961 Presence of intraocular lens: Secondary | ICD-10-CM | POA: Diagnosis not present

## 2020-01-18 DIAGNOSIS — H25812 Combined forms of age-related cataract, left eye: Secondary | ICD-10-CM | POA: Diagnosis not present

## 2020-01-19 DIAGNOSIS — E785 Hyperlipidemia, unspecified: Secondary | ICD-10-CM | POA: Diagnosis not present

## 2020-01-19 DIAGNOSIS — I5032 Chronic diastolic (congestive) heart failure: Secondary | ICD-10-CM | POA: Diagnosis not present

## 2020-01-19 DIAGNOSIS — Z8673 Personal history of transient ischemic attack (TIA), and cerebral infarction without residual deficits: Secondary | ICD-10-CM | POA: Diagnosis not present

## 2020-01-19 DIAGNOSIS — I4892 Unspecified atrial flutter: Secondary | ICD-10-CM | POA: Diagnosis not present

## 2020-01-19 DIAGNOSIS — I110 Hypertensive heart disease with heart failure: Secondary | ICD-10-CM | POA: Diagnosis not present

## 2020-01-19 DIAGNOSIS — I081 Rheumatic disorders of both mitral and tricuspid valves: Secondary | ICD-10-CM | POA: Diagnosis not present

## 2020-01-19 DIAGNOSIS — I480 Paroxysmal atrial fibrillation: Secondary | ICD-10-CM | POA: Diagnosis not present

## 2020-01-19 DIAGNOSIS — Z7901 Long term (current) use of anticoagulants: Secondary | ICD-10-CM | POA: Diagnosis not present

## 2020-01-19 DIAGNOSIS — Z6833 Body mass index (BMI) 33.0-33.9, adult: Secondary | ICD-10-CM | POA: Diagnosis not present

## 2020-01-19 DIAGNOSIS — Z7983 Long term (current) use of bisphosphonates: Secondary | ICD-10-CM | POA: Diagnosis not present

## 2020-01-24 DIAGNOSIS — R9431 Abnormal electrocardiogram [ECG] [EKG]: Secondary | ICD-10-CM | POA: Diagnosis not present

## 2020-01-24 DIAGNOSIS — I484 Atypical atrial flutter: Secondary | ICD-10-CM | POA: Diagnosis not present

## 2020-01-24 DIAGNOSIS — I495 Sick sinus syndrome: Secondary | ICD-10-CM | POA: Diagnosis not present

## 2020-01-24 DIAGNOSIS — N189 Chronic kidney disease, unspecified: Secondary | ICD-10-CM | POA: Diagnosis not present

## 2020-01-24 DIAGNOSIS — I130 Hypertensive heart and chronic kidney disease with heart failure and stage 1 through stage 4 chronic kidney disease, or unspecified chronic kidney disease: Secondary | ICD-10-CM | POA: Diagnosis not present

## 2020-01-24 DIAGNOSIS — I480 Paroxysmal atrial fibrillation: Secondary | ICD-10-CM | POA: Diagnosis not present

## 2020-01-24 DIAGNOSIS — I4891 Unspecified atrial fibrillation: Secondary | ICD-10-CM | POA: Diagnosis not present

## 2020-01-24 DIAGNOSIS — I5032 Chronic diastolic (congestive) heart failure: Secondary | ICD-10-CM | POA: Diagnosis not present

## 2020-01-24 DIAGNOSIS — Z7901 Long term (current) use of anticoagulants: Secondary | ICD-10-CM | POA: Diagnosis not present

## 2020-01-24 DIAGNOSIS — E785 Hyperlipidemia, unspecified: Secondary | ICD-10-CM | POA: Diagnosis not present

## 2020-02-08 DIAGNOSIS — E785 Hyperlipidemia, unspecified: Secondary | ICD-10-CM | POA: Diagnosis not present

## 2020-02-08 DIAGNOSIS — I480 Paroxysmal atrial fibrillation: Secondary | ICD-10-CM | POA: Diagnosis not present

## 2020-02-08 DIAGNOSIS — I110 Hypertensive heart disease with heart failure: Secondary | ICD-10-CM | POA: Diagnosis not present

## 2020-02-08 DIAGNOSIS — I5032 Chronic diastolic (congestive) heart failure: Secondary | ICD-10-CM | POA: Diagnosis not present

## 2020-02-08 DIAGNOSIS — Z8673 Personal history of transient ischemic attack (TIA), and cerebral infarction without residual deficits: Secondary | ICD-10-CM | POA: Diagnosis not present

## 2020-02-08 DIAGNOSIS — I495 Sick sinus syndrome: Secondary | ICD-10-CM | POA: Diagnosis not present

## 2020-02-08 DIAGNOSIS — Z950 Presence of cardiac pacemaker: Secondary | ICD-10-CM | POA: Diagnosis not present

## 2020-02-08 DIAGNOSIS — Z7983 Long term (current) use of bisphosphonates: Secondary | ICD-10-CM | POA: Diagnosis not present

## 2020-02-08 DIAGNOSIS — I10 Essential (primary) hypertension: Secondary | ICD-10-CM | POA: Diagnosis not present

## 2020-02-08 DIAGNOSIS — Z6833 Body mass index (BMI) 33.0-33.9, adult: Secondary | ICD-10-CM | POA: Diagnosis not present

## 2020-02-08 DIAGNOSIS — Z7901 Long term (current) use of anticoagulants: Secondary | ICD-10-CM | POA: Diagnosis not present

## 2020-02-16 DIAGNOSIS — Z45018 Encounter for adjustment and management of other part of cardiac pacemaker: Secondary | ICD-10-CM | POA: Diagnosis not present

## 2020-02-22 DIAGNOSIS — I4891 Unspecified atrial fibrillation: Secondary | ICD-10-CM | POA: Diagnosis not present

## 2020-02-22 DIAGNOSIS — E669 Obesity, unspecified: Secondary | ICD-10-CM | POA: Diagnosis not present

## 2020-02-22 DIAGNOSIS — Z6833 Body mass index (BMI) 33.0-33.9, adult: Secondary | ICD-10-CM | POA: Diagnosis not present

## 2020-02-22 DIAGNOSIS — N183 Chronic kidney disease, stage 3 unspecified: Secondary | ICD-10-CM | POA: Diagnosis not present

## 2020-03-23 DIAGNOSIS — R197 Diarrhea, unspecified: Secondary | ICD-10-CM | POA: Diagnosis not present

## 2020-03-25 ENCOUNTER — Other Ambulatory Visit (HOSPITAL_COMMUNITY): Payer: Self-pay | Admitting: Physician Assistant

## 2020-03-25 NOTE — Progress Notes (Signed)
I connected by phone with Isabel Hardy on 03/25/2020 at 1:28 PM to discuss the potential use of a new treatment for mild to moderate COVID-19 viral infection in non-hospitalized patients.  This patient is a 69 y.o. female that meets the FDA criteria for Emergency Use Authorization of COVID monoclonal antibody casirivimab/imdevimab, bamlanivimab/eteseviamb, or sotrovimab.  Has a (+) direct SARS-CoV-2 viral test result  Has mild or moderate COVID-19   Is NOT hospitalized due to COVID-19  Is within 10 days of symptom onset  Has at least one of the high risk factor(s) for progression to severe COVID-19 and/or hospitalization as defined in EUA.  Specific high risk criteria : Older age (>/= 68 yo) and Cardiovascular disease or hypertension   I have spoken and communicated the following to the patient or parent/caregiver regarding COVID monoclonal antibody treatment:  1. FDA has authorized the emergency use for the treatment of mild to moderate COVID-19 in adults and pediatric patients with positive results of direct SARS-CoV-2 viral testing who are 65 years of age and older weighing at least 40 kg, and who are at high risk for progressing to severe COVID-19 and/or hospitalization.  2. The significant known and potential risks and benefits of COVID monoclonal antibody, and the extent to which such potential risks and benefits are unknown.  3. Information on available alternative treatments and the risks and benefits of those alternatives, including clinical trials.  4. Patients treated with COVID monoclonal antibody should continue to self-isolate and use infection control measures (e.g., wear mask, isolate, social distance, avoid sharing personal items, clean and disinfect "high touch" surfaces, and frequent handwashing) according to CDC guidelines.   5. The patient or parent/caregiver has the option to accept or refuse COVID monoclonal antibody treatment.  After reviewing this information  with the patient, the patient has agreed to receive one of the available covid 19 monoclonal antibodies and will be provided an appropriate fact sheet prior to infusion. Jodell Cipro, PA-C 03/25/2020 1:28 PM

## 2020-03-28 ENCOUNTER — Ambulatory Visit (HOSPITAL_COMMUNITY)
Admission: RE | Admit: 2020-03-28 | Discharge: 2020-03-28 | Disposition: A | Discharge status: Home / Self Care | Payer: Medicare PPO | Source: Ambulatory Visit | Attending: Pulmonary Disease | Admitting: Pulmonary Disease

## 2020-03-28 ENCOUNTER — Other Ambulatory Visit (HOSPITAL_COMMUNITY): Payer: Self-pay

## 2020-03-28 DIAGNOSIS — I10 Essential (primary) hypertension: Secondary | ICD-10-CM | POA: Insufficient documentation

## 2020-03-28 DIAGNOSIS — Z23 Encounter for immunization: Secondary | ICD-10-CM | POA: Insufficient documentation

## 2020-03-28 DIAGNOSIS — U071 COVID-19: Secondary | ICD-10-CM | POA: Diagnosis present

## 2020-03-28 DIAGNOSIS — R54 Age-related physical debility: Secondary | ICD-10-CM | POA: Insufficient documentation

## 2020-03-28 MED ORDER — EPINEPHRINE 0.3 MG/0.3ML IJ SOAJ: 0.3000 mg | Freq: Once | INTRAMUSCULAR | Status: DC | PRN

## 2020-03-28 MED ORDER — FAMOTIDINE IN NACL 20-0.9 MG/50ML-% IV SOLN: 20.0000 mg | Freq: Once | INTRAVENOUS | Status: DC | PRN

## 2020-03-28 MED ORDER — METHYLPREDNISOLONE SODIUM SUCC 125 MG IJ SOLR: 125.0000 mg | Freq: Once | INTRAMUSCULAR | Status: DC | PRN

## 2020-03-28 MED ORDER — SODIUM CHLORIDE 0.9 % IV SOLN
1200.0000 mg | Freq: Once | INTRAVENOUS | Status: AC
  Administered 2020-03-28: 1200 mg via INTRAVENOUS

## 2020-03-28 MED ORDER — ONDANSETRON HCL 4 MG/2ML IJ SOLN
4.0000 mg | Freq: Once | INTRAMUSCULAR | Status: AC
  Administered 2020-03-28: 4 mg via INTRAVENOUS
  Filled 2020-03-28: qty 2

## 2020-03-28 MED ORDER — ALBUTEROL SULFATE HFA 108 (90 BASE) MCG/ACT IN AERS: 2.0000 | INHALATION_SPRAY | Freq: Once | RESPIRATORY_TRACT | Status: DC | PRN

## 2020-03-28 MED ORDER — DIPHENHYDRAMINE HCL 50 MG/ML IJ SOLN: 50.0000 mg | Freq: Once | INTRAMUSCULAR | Status: DC | PRN

## 2020-03-28 MED ORDER — SODIUM CHLORIDE 0.9 % IV SOLN: INTRAVENOUS | Status: DC | PRN

## 2020-03-28 NOTE — Progress Notes (Signed)
  Diagnosis: COVID-19  Physician: Dr. Wright  Procedure: Covid Infusion Clinic Med: casirivimab\imdevimab infusion - Provided patient with casirivimab\imdevimab fact sheet for patients, parents and caregivers prior to infusion.  Complications: No immediate complications noted.  Discharge: Discharged home   Isabel Hardy  B [name] 03/28/2020   

## 2020-03-28 NOTE — Discharge Instructions (Signed)
What types of side effects do monoclonal antibody drugs cause?  Common side effects  In general, the more common side effects caused by monoclonal antibody drugs include: . Allergic reactions, such as hives or itching . Flu-like signs and symptoms, including chills, fatigue, fever, and muscle aches and pains . Nausea, vomiting . Diarrhea . Skin rashes . Low blood pressure   The CDC is recommending patients who receive monoclonal antibody treatments wait at least 90 days before being vaccinated.  Currently, there are no data on the safety and efficacy of mRNA COVID-19 vaccines in persons who received monoclonal antibodies or convalescent plasma as part of COVID-19 treatment. Based on the estimated half-life of such therapies as well as evidence suggesting that reinfection is uncommon in the 90 days after initial infection, vaccination should be deferred for at least 90 days, as a precautionary measure until additional information becomes available, to avoid interference of the antibody treatment with vaccine-induced immune responses.  If you have any questions or concerns after the infusion please call the Advanced Practice Provider on call at [phone]. This number is ONLY intended for your use regarding questions or concerns about the infusion post-treatment side-effects.  Please do not provide this number to others for use. For return to work notes please contact your primary care provider.   If someone you know is interested in receiving treatment please have them call the COVID hotline at [phone].   

## 2020-03-28 NOTE — Progress Notes (Signed)
Patient reviewed Fact Sheet for Patients, Parents, and Caregivers for Emergency Use Authorization (EUA) of Casi/imdevimab for the Treatment of Coronavirus. Patient also reviewed and is agreeable to the estimated cost of treatment. Patient is agreeable to proceed.    

## 2020-04-01 ENCOUNTER — Encounter (HOSPITAL_COMMUNITY): Payer: Self-pay

## 2020-04-01 ENCOUNTER — Other Ambulatory Visit: Payer: Self-pay

## 2020-04-01 ENCOUNTER — Emergency Department (HOSPITAL_COMMUNITY): Payer: Medicare PPO

## 2020-04-01 ENCOUNTER — Inpatient Hospital Stay (HOSPITAL_COMMUNITY)
Admission: EM | Admit: 2020-04-01 | Discharge: 2020-04-05 | DRG: 177 | Disposition: A | Discharge status: Home / Self Care | Payer: Medicare PPO | Attending: Family Medicine | Admitting: Family Medicine

## 2020-04-01 DIAGNOSIS — N261 Atrophy of kidney (terminal): Secondary | ICD-10-CM | POA: Diagnosis not present

## 2020-04-01 DIAGNOSIS — I2510 Atherosclerotic heart disease of native coronary artery without angina pectoris: Secondary | ICD-10-CM | POA: Diagnosis present

## 2020-04-01 DIAGNOSIS — I484 Atypical atrial flutter: Secondary | ICD-10-CM | POA: Diagnosis not present

## 2020-04-01 DIAGNOSIS — M4800 Spinal stenosis, site unspecified: Secondary | ICD-10-CM | POA: Diagnosis present

## 2020-04-01 DIAGNOSIS — R400 Somnolence: Secondary | ICD-10-CM | POA: Diagnosis present

## 2020-04-01 DIAGNOSIS — U071 COVID-19: Principal | ICD-10-CM | POA: Diagnosis present

## 2020-04-01 DIAGNOSIS — E875 Hyperkalemia: Secondary | ICD-10-CM | POA: Diagnosis present

## 2020-04-01 DIAGNOSIS — J159 Unspecified bacterial pneumonia: Secondary | ICD-10-CM | POA: Diagnosis present

## 2020-04-01 DIAGNOSIS — I517 Cardiomegaly: Secondary | ICD-10-CM | POA: Diagnosis not present

## 2020-04-01 DIAGNOSIS — N2889 Other specified disorders of kidney and ureter: Secondary | ICD-10-CM | POA: Diagnosis not present

## 2020-04-01 DIAGNOSIS — R112 Nausea with vomiting, unspecified: Secondary | ICD-10-CM | POA: Diagnosis not present

## 2020-04-01 DIAGNOSIS — Z841 Family history of disorders of kidney and ureter: Secondary | ICD-10-CM

## 2020-04-01 DIAGNOSIS — E876 Hypokalemia: Secondary | ICD-10-CM | POA: Diagnosis present

## 2020-04-01 DIAGNOSIS — E785 Hyperlipidemia, unspecified: Secondary | ICD-10-CM | POA: Diagnosis present

## 2020-04-01 DIAGNOSIS — I4892 Unspecified atrial flutter: Secondary | ICD-10-CM | POA: Diagnosis present

## 2020-04-01 DIAGNOSIS — Z952 Presence of prosthetic heart valve: Secondary | ICD-10-CM

## 2020-04-01 DIAGNOSIS — E861 Hypovolemia: Secondary | ICD-10-CM | POA: Diagnosis not present

## 2020-04-01 DIAGNOSIS — N1832 Chronic kidney disease, stage 3b: Secondary | ICD-10-CM | POA: Diagnosis present

## 2020-04-01 DIAGNOSIS — Z7901 Long term (current) use of anticoagulants: Secondary | ICD-10-CM | POA: Diagnosis not present

## 2020-04-01 DIAGNOSIS — Z90710 Acquired absence of both cervix and uterus: Secondary | ICD-10-CM

## 2020-04-01 DIAGNOSIS — J1282 Pneumonia due to coronavirus disease 2019: Secondary | ICD-10-CM | POA: Diagnosis present

## 2020-04-01 DIAGNOSIS — Z9049 Acquired absence of other specified parts of digestive tract: Secondary | ICD-10-CM

## 2020-04-01 DIAGNOSIS — R110 Nausea: Secondary | ICD-10-CM | POA: Diagnosis not present

## 2020-04-01 DIAGNOSIS — R1111 Vomiting without nausea: Secondary | ICD-10-CM | POA: Diagnosis not present

## 2020-04-01 DIAGNOSIS — R0602 Shortness of breath: Secondary | ICD-10-CM | POA: Diagnosis not present

## 2020-04-01 DIAGNOSIS — R0902 Hypoxemia: Secondary | ICD-10-CM | POA: Diagnosis not present

## 2020-04-01 DIAGNOSIS — I9589 Other hypotension: Secondary | ICD-10-CM | POA: Diagnosis present

## 2020-04-01 DIAGNOSIS — I495 Sick sinus syndrome: Secondary | ICD-10-CM | POA: Diagnosis present

## 2020-04-01 DIAGNOSIS — E872 Acidosis: Secondary | ICD-10-CM | POA: Diagnosis present

## 2020-04-01 DIAGNOSIS — R531 Weakness: Secondary | ICD-10-CM | POA: Diagnosis not present

## 2020-04-01 DIAGNOSIS — Z79899 Other long term (current) drug therapy: Secondary | ICD-10-CM

## 2020-04-01 DIAGNOSIS — R5381 Other malaise: Secondary | ICD-10-CM | POA: Diagnosis present

## 2020-04-01 DIAGNOSIS — R319 Hematuria, unspecified: Secondary | ICD-10-CM | POA: Diagnosis present

## 2020-04-01 DIAGNOSIS — Z8249 Family history of ischemic heart disease and other diseases of the circulatory system: Secondary | ICD-10-CM

## 2020-04-01 DIAGNOSIS — E870 Hyperosmolality and hypernatremia: Secondary | ICD-10-CM | POA: Diagnosis present

## 2020-04-01 DIAGNOSIS — I700 Atherosclerosis of aorta: Secondary | ICD-10-CM | POA: Diagnosis not present

## 2020-04-01 DIAGNOSIS — I959 Hypotension, unspecified: Secondary | ICD-10-CM | POA: Diagnosis not present

## 2020-04-01 DIAGNOSIS — I480 Paroxysmal atrial fibrillation: Secondary | ICD-10-CM | POA: Diagnosis present

## 2020-04-01 DIAGNOSIS — R8281 Pyuria: Secondary | ICD-10-CM | POA: Diagnosis present

## 2020-04-01 DIAGNOSIS — R069 Unspecified abnormalities of breathing: Secondary | ICD-10-CM | POA: Diagnosis not present

## 2020-04-01 DIAGNOSIS — N183 Chronic kidney disease, stage 3 unspecified: Secondary | ICD-10-CM | POA: Diagnosis not present

## 2020-04-01 DIAGNOSIS — N170 Acute kidney failure with tubular necrosis: Secondary | ICD-10-CM | POA: Diagnosis not present

## 2020-04-01 DIAGNOSIS — N19 Unspecified kidney failure: Secondary | ICD-10-CM | POA: Diagnosis not present

## 2020-04-01 DIAGNOSIS — R778 Other specified abnormalities of plasma proteins: Secondary | ICD-10-CM | POA: Diagnosis present

## 2020-04-01 DIAGNOSIS — Z950 Presence of cardiac pacemaker: Secondary | ICD-10-CM | POA: Diagnosis not present

## 2020-04-01 DIAGNOSIS — E860 Dehydration: Secondary | ICD-10-CM | POA: Diagnosis present

## 2020-04-01 DIAGNOSIS — I129 Hypertensive chronic kidney disease with stage 1 through stage 4 chronic kidney disease, or unspecified chronic kidney disease: Secondary | ICD-10-CM | POA: Diagnosis present

## 2020-04-01 DIAGNOSIS — Z8673 Personal history of transient ischemic attack (TIA), and cerebral infarction without residual deficits: Secondary | ICD-10-CM

## 2020-04-01 DIAGNOSIS — N179 Acute kidney failure, unspecified: Secondary | ICD-10-CM

## 2020-04-01 DIAGNOSIS — J189 Pneumonia, unspecified organism: Secondary | ICD-10-CM | POA: Diagnosis not present

## 2020-04-01 HISTORY — DX: COVID-19: U07.1

## 2020-04-01 LAB — COMPREHENSIVE METABOLIC PANEL
ALT: 22 U/L (ref 0–44)
AST: 30 U/L (ref 15–41)
Albumin: 3.2 g/dL — ABNORMAL LOW (ref 3.5–5.0)
Alkaline Phosphatase: 66 U/L (ref 38–126)
Anion gap: 20 — ABNORMAL HIGH (ref 5–15)
BUN: 152 mg/dL — ABNORMAL HIGH (ref 8–23)
CO2: 8 mmol/L — ABNORMAL LOW (ref 22–32)
Calcium: 9 mg/dL (ref 8.9–10.3)
Chloride: 112 mmol/L — ABNORMAL HIGH (ref 98–111)
Creatinine, Ser: 9.83 mg/dL — ABNORMAL HIGH (ref 0.44–1.00)
GFR, Estimated: 4 mL/min — ABNORMAL LOW (ref >60)
Glucose, Bld: 180 mg/dL — ABNORMAL HIGH (ref 70–99)
Potassium: 5.3 mmol/L — ABNORMAL HIGH (ref 3.5–5.1)
Sodium: 140 mmol/L (ref 135–145)
Total Bilirubin: 1.3 mg/dL — ABNORMAL HIGH (ref 0.3–1.2)
Total Protein: 6.2 g/dL — ABNORMAL LOW (ref 6.5–8.1)

## 2020-04-01 LAB — I-STAT VENOUS BLOOD GAS, ED
Acid-base deficit: 14 mmol/L — ABNORMAL HIGH (ref 0.0–2.0)
Bicarbonate: 9.1 mmol/L — ABNORMAL LOW (ref 20.0–28.0)
Calcium, Ion: 0.86 mmol/L — CL (ref 1.15–1.40)
HCT: 35 % — ABNORMAL LOW (ref 36.0–46.0)
Hemoglobin: 11.9 g/dL — ABNORMAL LOW (ref 12.0–15.0)
O2 Saturation: 100 %
Potassium: 5.2 mmol/L — ABNORMAL HIGH (ref 3.5–5.1)
Sodium: 140 mmol/L (ref 135–145)
TCO2: 10 mmol/L — ABNORMAL LOW (ref 22–32)
pCO2, Ven: 15.7 mmHg — CL (ref 44.0–60.0)
pH, Ven: 7.369 (ref 7.250–7.430)
pO2, Ven: 194 mmHg — ABNORMAL HIGH (ref 32.0–45.0)

## 2020-04-01 LAB — CBC WITH DIFFERENTIAL/PLATELET
Abs Immature Granulocytes: 0.08 10*3/uL — ABNORMAL HIGH (ref 0.00–0.07)
Basophils Absolute: 0 10*3/uL (ref 0.0–0.1)
Basophils Relative: 1 %
Eosinophils Absolute: 0.2 10*3/uL (ref 0.0–0.5)
Eosinophils Relative: 3 %
HCT: 41.8 % (ref 36.0–46.0)
Hemoglobin: 14.3 g/dL (ref 12.0–15.0)
Immature Granulocytes: 1 %
Lymphocytes Relative: 10 %
Lymphs Abs: 0.9 10*3/uL (ref 0.7–4.0)
MCH: 31 pg (ref 26.0–34.0)
MCHC: 34.2 g/dL (ref 30.0–36.0)
MCV: 90.5 fL (ref 80.0–100.0)
Monocytes Absolute: 0.8 10*3/uL (ref 0.1–1.0)
Monocytes Relative: 9 %
Neutro Abs: 6.8 10*3/uL (ref 1.7–7.7)
Neutrophils Relative %: 76 %
Platelets: 222 10*3/uL (ref 150–400)
RBC: 4.62 MIL/uL (ref 3.87–5.11)
RDW: 13.6 % (ref 11.5–15.5)
WBC: 8.8 10*3/uL (ref 4.0–10.5)
nRBC: 0 % (ref 0.0–0.2)

## 2020-04-01 LAB — TROPONIN I (HIGH SENSITIVITY)
Troponin I (High Sensitivity): 47 ng/L — ABNORMAL HIGH (ref <18)
Troponin I (High Sensitivity): 46 ng/L — ABNORMAL HIGH (ref <18)

## 2020-04-01 LAB — URINALYSIS, ROUTINE W REFLEX MICROSCOPIC
Bilirubin Urine: NEGATIVE
Glucose, UA: NEGATIVE mg/dL
Ketones, ur: NEGATIVE mg/dL
Nitrite: NEGATIVE
Protein, ur: NEGATIVE mg/dL
Specific Gravity, Urine: 1.014 (ref 1.005–1.030)
pH: 5 (ref 5.0–8.0)

## 2020-04-01 LAB — LACTATE DEHYDROGENASE: LDH: 367 U/L — ABNORMAL HIGH (ref 98–192)

## 2020-04-01 LAB — C-REACTIVE PROTEIN: CRP: 6.3 mg/dL — ABNORMAL HIGH (ref <1.0)

## 2020-04-01 LAB — D-DIMER, QUANTITATIVE: D-Dimer, Quant: 0.66 ug/mL-FEU — ABNORMAL HIGH (ref 0.00–0.50)

## 2020-04-01 LAB — FIBRINOGEN: Fibrinogen: 754 mg/dL — ABNORMAL HIGH (ref 210–475)

## 2020-04-01 LAB — LACTIC ACID, PLASMA
Lactic Acid, Venous: 1.7 mmol/L (ref 0.5–1.9)
Lactic Acid, Venous: 1.7 mmol/L (ref 0.5–1.9)

## 2020-04-01 LAB — FERRITIN: Ferritin: 1354 ng/mL — ABNORMAL HIGH (ref 11–307)

## 2020-04-01 LAB — TRIGLYCERIDES: Triglycerides: 228 mg/dL — ABNORMAL HIGH (ref <150)

## 2020-04-01 LAB — PROCALCITONIN: Procalcitonin: 0.15 ng/mL

## 2020-04-01 MED ORDER — SODIUM ZIRCONIUM CYCLOSILICATE 5 G PO PACK
5.0000 g | PACK | ORAL | Status: AC
  Administered 2020-04-01: 14:00:00 5 g via ORAL
  Filled 2020-04-01: qty 1

## 2020-04-01 MED ORDER — ACETAMINOPHEN 325 MG PO TABS
650.0000 mg | ORAL_TABLET | Freq: Four times a day (QID) | ORAL | Status: DC | PRN
  Administered 2020-04-04: 650 mg via ORAL
  Filled 2020-04-01 (×2): qty 2

## 2020-04-01 MED ORDER — SODIUM CHLORIDE 0.9 % IV BOLUS
1000.0000 mL | Freq: Once | INTRAVENOUS | Status: AC
  Administered 2020-04-01: 12:00:00 1000 mL via INTRAVENOUS

## 2020-04-01 MED ORDER — SODIUM CHLORIDE 0.9 % IV SOLN
1.0000 g | Freq: Once | INTRAVENOUS | Status: AC
  Administered 2020-04-01: 13:00:00 1 g via INTRAVENOUS
  Filled 2020-04-01: qty 10

## 2020-04-01 MED ORDER — LACTATED RINGERS IV BOLUS
1000.0000 mL | Freq: Once | INTRAVENOUS | Status: AC
  Administered 2020-04-01: 14:00:00 1000 mL via INTRAVENOUS

## 2020-04-01 MED ORDER — SODIUM CHLORIDE 0.9 % IV SOLN
500.0000 mg | Freq: Once | INTRAVENOUS | Status: AC
  Administered 2020-04-01: 14:00:00 500 mg via INTRAVENOUS
  Filled 2020-04-01: qty 500

## 2020-04-01 MED ORDER — SODIUM CHLORIDE 0.9 % IV BOLUS: 1000.0000 mL | Freq: Once | INTRAVENOUS | Status: DC

## 2020-04-01 MED ORDER — LACTATED RINGERS IV SOLN: INTRAVENOUS | Status: DC

## 2020-04-01 MED ORDER — APIXABAN 5 MG PO TABS
5.0000 mg | ORAL_TABLET | Freq: Two times a day (BID) | ORAL | Status: DC
  Administered 2020-04-01 – 2020-04-05 (×8): 5 mg via ORAL
  Filled 2020-04-01 (×8): qty 1

## 2020-04-01 MED ORDER — SODIUM BICARBONATE 8.4 % IV SOLN
50.0000 meq | Freq: Once | INTRAVENOUS | Status: AC
  Administered 2020-04-01: 13:00:00 50 meq via INTRAVENOUS
  Filled 2020-04-01: qty 50

## 2020-04-01 NOTE — Consult Note (Signed)
Nephrology Consult  [facility]  Requesting provider: Long, Arlyss Repress, MD  Assessment/Recommendations: Isabel Hardy is a/an 69 y.o. female with a past medical history notable for AKI.    AKI on CKD3: Likely secondary to ATN from prolonged pre-renal injury and hypotension -baseline Cr around 1.6, now 9.8 -continue with aggressive volume resuscitation. No urgent indication for renal replacement therapy as of yet, however if there is no improvement or if she develops an indication, would have a low threshold to start -renal ultrasound -ua w/ microscopy -Continue to monitor daily Cr, Dose meds for GFR<15 -Monitor Daily I/Os, Daily weight -Maintain MAP>65 for optimal renal perfusion.  -Agree with holding ARB/HCTZ, avoid further nephrotoxins including NSAIDS, Morphine.  Unless absolutely necessary, avoid CT with contrast and/or MRI with gadolinium.     COVID -Management per primary service  Anion gap metabolic acidosis -check blood gas -consider LR for volume  Hyperkalemia -lokelma  Hypotension -fluid responsive -MAP goal >65  H/o HTN -hold home anti-htns  Afib -rate controlled  Recommendations conveyed to primary service.    Anthony Sar [facility] 04/01/2020 3:46 PM   _____________________________________________________________________________________   History of Present Illness: Isabel Hardy is a/an 69 y.o. female with a past medical history of CKD, A. fib, hypertension, coronary artery disease, history of mitral valve repair, permanent pacemaker, history of TIA who presents to [facility] with nausea/vomiting/diarrhea for couple weeks.  She was recently diagnosed with Covid on 12/8 and received monoclonal antibody earlier this week.  Of note, she has been vaccinated. Husband reports that during the course of her symptoms, she had been taking her Micardis. To the husband's understanding, she does not have a kidney doctor. Lack of CAPR at  the moment in the ER, unable to interview patient. Data obtained through chart review and through husband over the phone. Discussed with primary service.  Medications:  Current Facility-Administered Medications  Medication Dose Route Frequency Provider Last Rate Last Admin  . lactated ringers infusion   Intravenous Continuous Farrel Gordon, PA-C 75 mL/hr at 04/01/20 1404 New Bag at 04/01/20 1404   Current Outpatient Medications  Medication Sig Dispense Refill  . acetaminophen (TYLENOL) 650 MG CR tablet Take 650 mg by mouth every 8 (eight) hours as needed for pain.    Marland Kitchen ALPRAZolam (XANAX) 0.5 MG tablet Take 0.5 mg by mouth daily as needed for anxiety.    Marland Kitchen apixaban (ELIQUIS) 5 MG TABS tablet Take 5 mg by mouth 2 (two) times a day.     Marland Kitchen atorvastatin (LIPITOR) 40 MG tablet Take 40 mg by mouth every evening.     . Calcium Carbonate-Vit D-Min (CALCIUM 1200) 1200-1000 MG-UNIT CHEW Chew 1 tablet by mouth daily.    . Cholecalciferol (DIALYVITE VITAMIN D 5000) 125 MCG (5000 UT) capsule Take 2,000 Units by mouth daily.     . cyclobenzaprine (FLEXERIL) 10 MG tablet Take 1 tablet (10 mg total) by mouth 3 (three) times daily as needed for muscle spasms. 30 tablet 0  . docusate sodium (COLACE) 100 MG capsule Take 1 capsule (100 mg total) by mouth 2 (two) times daily. 10 capsule 0  . furosemide (LASIX) 20 MG tablet Take 20 mg by mouth daily.    . hydrochlorothiazide (HYDRODIURIL) 12.5 MG tablet Take 12.5 mg by mouth daily.    . Magnesium Oxide 250 MG TABS Take 250 mg by mouth 2 (two) times daily.     . Menthol, Topical Analgesic, (BIOFREEZE EX) Apply 1 application topically daily as needed (  pain).    . metoprolol tartrate (LOPRESSOR) 25 MG tablet Take 12.5 mg by mouth 2 (two) times a day.     . potassium chloride SA (K-DUR,KLOR-CON) 20 MEQ tablet Take 40 mEq by mouth daily.     Marland Kitchen telmisartan-hydrochlorothiazide (MICARDIS HCT) 40-12.5 MG tablet Take 1 tablet by mouth daily.       ALLERGIES Patient has no  known allergies.  MEDICAL HISTORY Past Medical History:  Diagnosis Date  . Atrial fibrillation (HCC)   . Dysrhythmia   . H/O mitral valve repair   . Hypertension   . Presence of permanent cardiac pacemaker   . Stroke [facility])    per patient, "mini stroke in 2005/06"     SOCIAL HISTORY Social History   Socioeconomic History  . Marital status: Married    Spouse name: Not on file  . Number of children: Not on file  . Years of education: Not on file  . Highest education level: Not on file  Occupational History  . Not on file  Tobacco Use  . Smoking status: Never Smoker  . Smokeless tobacco: Never Used  Vaping Use  . Vaping Use: Never used  Substance and Sexual Activity  . Alcohol use: No  . Drug use: No  . Sexual activity: Not on file  Other Topics Concern  . Not on file  Social History Narrative  . Not on file   Social Determinants of Health   Financial Resource Strain: Not on file  Food Insecurity: Not on file  Transportation Needs: Not on file  Physical Activity: Not on file  Stress: Not on file  Social Connections: Not on file  Intimate Partner Violence: Not on file     FAMILY HISTORY Family History  Problem Relation Age of Onset  . Heart attack Father   . Kidney disease Mother   . Hypertension Mother     Review of Systems: 12 systems reviewed Otherwise as per HPI, all other systems reviewed and negative  Physical Exam: Vitals:   04/01/20 1250 04/01/20 1400  BP: (!) 94/54 90/64  Pulse: 77 77  Resp: 18 (!) 22  Temp:    SpO2: 100% 98%   No intake/output data recorded. No intake or output data in the 24 hours ending 04/01/20 1546 Physical Exam not performed in the setting of the COVID-19 pandemic and to conserve PPE.  Test Results Reviewed Lab Results  Component Value Date   NA 140 04/01/2020   K 5.2 (H) 04/01/2020   CL 112 (H) 04/01/2020   CO2 8 (L) 04/01/2020   BUN 152 (H) 04/01/2020   CREATININE 9.83 (H) 04/01/2020   CALCIUM 9.0  04/01/2020   ALBUMIN 3.2 (L) 04/01/2020     I have reviewed all relevant outside healthcare records related to the patient's kidney injury.

## 2020-04-01 NOTE — ED Notes (Signed)
BM-- pericare preformed and new purewick placed.  Redness noted to bottom  Bladder scan 55mL

## 2020-04-01 NOTE — ED Triage Notes (Signed)
Patient arrived by [facility] with complaint of increased fatigue with abdominal pain and vomiting since 12/7. Patient vaccinated and diagnosed with Covid 12/8, also has received infusion. Patient slow to respond and BP 70s. Pale and skin cool to touch

## 2020-04-01 NOTE — H&P (Addendum)
Family Medicine Teaching [facility] Admission History and Physical Service Pager: [phone]  Patient name: Isabel Hardy Medical record number: [id number] Date of birth: Jan 15, 1951 Age: 69 y.o. Gender: female  Primary Care Provider: Buckner Malta, MD Consultants: Nephrology Code Status: Do not want Chest compression or shock, OK to intubate Preferred Emergency Contact: Spouse [name] @ [phone]   Chief Complaint: Hypotension and fatigue  Assessment and Plan: Isabel Hardy is a 69 y.o. female presenting with Hypotention and fatigue. Dx of COVID on 03/28/20. PMH is significant for Afib on Eliquis, HTN, HLD, Sinus node dysfunction, Lumber Spinal stenosis.   Hypotension Nausea/Vomitting/diarrhoea Pt c/o hypotension and fatigue. Patient reported that she had been having nausea and vomiting with diarrhea and has not been able to keep anything down for past couple of days. Patient was found Covid positive on 03/28/2020 and she received a monoclonal antibody infusion on 03/28/2020. She presented to ED with low BP of 77/61 mmHg with PR of 59/min. Patient was tachypnic with RR of 26/min. Also noted to be "slow to respond" by ED provider."  Pt's temperature was mildly elevated to 99.2 F. Labs showed Na 140, K-5.3 with increase of  Inflammatory markers (LDH 367, ferritin 1354, CRP 6.3). D-dimers increased at 1.66 and fibrinogen at 754. lactic acid and procalcitonin WNL. WBC is normal at 8.8. PCO2 low at 15.7 and PO2 high at 194. Patient was given bolus IV fluids LR and NS, patient blood pressure improved to 101/64 mmHg and mentation improved, A&Ox4 on admission exam. Pt 's hypotension probably due to vomiting and diarrhea and decreased fluid intake over last few days. It can also be due to inflammatory reaction due to COVID. Anemia ruled out by normal Hb of 14.3. We will continue to monitor blood pressure and give IV fluids to improve perfusion. -Admit to progressive, attending Dr.  McDiarmid. -Monitor vitals per floor. -A.m. CBC -A.m. CMP  -Acetaminophen 650 mg as needed for pain headache or fever.  -LR @ 100 mL/hr -Continue cardiac monitoring  -OT and PT eval and treat.  COVID Positive Pneumonia Concern for super-imposed bacterial pneumonia Likely cause of above.  Pt was found COVID positive on 03/28/2020. Pt received monoclonal antibody infusion earlier this week. Pt has been vaccinated against COVID. At admission, patient satting well at room air. On examination clear to auscultation bilateral. Chest X ray showed Right infrahilar opacity concerning for possible pneumonia. CT abdomen/pelvis confirmed multifocal bilateral pneumonia. Patient was started on antibiotics and was given 1 dose of ceftriaxone and azithromycin for pneumonia.  Will hold on to Dexamethasone and remdesivir as patient is satting well at room air and does not need oxygen.  COVID labs elevated including LDH, troponin, D-dimer, fibrinogen, CRP.  Procalcitonin and LA WNL.  D-dimer age-adjusts, low risk for VTE. - s/p MAB - start dexamethasone and remdesivir if requires O2 - s/p CTX and Azithro for possible superimposed bacterial pneumonia, will hold off today as procal WNL, less likely bacterial process and no SOB -Contact and air bone precautions. -vitals per floor. -Maintain oxygen saturation more than 92% -Continue incentive spirometry. -Will move patient to Covid floor. - repeat CXR 3-4 weeks after Abx to ensure right hilar opacity has resolved to r/o underlying malignancy  Acute Renal Failure No history of CKD.  Patient states she has been urinating ok but complains of blood in urine. Creatine at Admission was 9.83 with BUN of 152 and anion gap of 20. Patient creatinine on 01/17/2019 was 1.66 and BUN was 22.  Renal ultrasound showed  atrophic right kidney and normal left kidney. CT abdomen/ pelvis confirmed atrophic right kidney.  Patient was given bolus of fluids(LR and NS, total 2L), Lokelma and 1  amp bicarb in the ED. Patient AKI most likely due to ATN 2/2 hypovolemia and renal hypoperfusion in setting of hypotension.  We will continue IV fluids to prevent hypoperfusion. Nephrology consulted. Appreciate recommendations . Nephrology recommended supportive treatment for now and will re acess tomorrow.  Did not advise dialysis at this time, but will consider if no improvement in Cr over next 24 hrs.  Per nephro, no need for bicarb at present unless continues to decrease, and then would recommend VBG to determine. -Nephro consulted. Appreciate recommendations. -A.m. CMP. -Send urinalysis with reflex microscopic -Monitor I and Os -Daily weight -Hold ARB/ HCTZ in setting of ARF and hypotension - cont LR 100cc/hr - renal diet pending improvement -Maintain MAP > 65 -Avoid nephrotoxins and contrast.  Altered Mental Status Noted in ED by ED provider, thought to be 2/2 uremia with BUN 152 or hypotension.  Improved after fluid administration.  A&Ox4 on exam, neurologically intact.  Most likely she was more somnolent in the setting of acute illness with hypotension, but continuing to monitor uremic symptoms closely. - cont fluids per above - ARF treatment per above - holding sedating agents and BP meds - has xanax listed on home meds, but not on PMP, therefore low risk for withdrawal  Hyperkalemia K 5.3 on admission.  Likely 2/2 above.  Patient given lokelma x1.  No EKG changes. - monitor K on BMP - cardiac telemetry  Elevated troponins CAD Had lexiscan 12/2018 pre-operatively and was low risk, EF 73% at that time.  Patient denies chest pain, and shortness of breath. At admission, Pt's troponins mildly elevated at 47.  Repeat troponin was down trended to 46.  Likely elevated in the setting of hypotension, acute COVID and renal failure. -Monitor vitals. 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 Fib. Heart rate range from 50 to to 91/min. Patient has a dual-chamber pacemaker (03/17/2018) for sinus bradycardia and pauses.  History of mitral valve repair with annuloplasty in 2017, developed A fib afterwards. S/P DCCV 01/24/20.  Patient last saw her cardiologist on 02/08/2020 and was noted to be in A fib.  Her device was last checked in 01/19/2020. home medication includes metoprolol 25 twice daily and Multaq 400 mg twice daily per cardiology notes, but Multaq not listed in med rec. Pt takes Eliquis 5 mg BID. At last visit, her cardiologist discussed ablation if it reccurs again. Will hold on to Cardiology consult as pt's pulse rate is controlled.   -EKG as needed. -Continue cardiac monitoring -Continue Eliquis 5 mg twice daily -Hold metoprolol in setting of hypotension and stable rate at present -attempt to determine is patient is still on multaq, holding until then   HTN Initially hypotensive on admission, see above.  Home meds include HCTZ 12.5 daily, Telmisartan-hydrochlorothiazide 40-12.5 mg daily. Lasix was stopped recently on 02/08/2020 and changed to as needed only.  -Hold home HTN medications in setting of hypotension  HLD Home meds include Lipitor.  Triglycerides were 228 at admission.  last lipid panel was normal on 12/30/2016.  -Hold Lipitor, can restart tomorrow if LFTs stable  Lumber Spinal stenosis.   S/P b/l L4-5 laminectomy and b/l L3-4 laminotomies/foraminotomies 12/2018.  Home meds include cyclobenzaprine -Hold home cyclobenzaprine in setting of decreased mentation on arrival - would consider change to baclofen given age if restart  FEN/GI: Renal diet fluid restriction less than 1200 mL Prophylaxis: Eliquis 5 mg BID   Disposition: Progressive  History of Present Illness:  Isabel Hardy is a 69 y.o. female presenting with fatigue and hypotension. Pt states she had sore throat, headache and cold symptoms few days ago  and then she was found to be positive for COVID on 03/28/2020. She received monoclonal antibody infusion on 03/28/2020. Patient reported that she had been having nausea and vomiting with diarrhea and has not been able to keep anything down for past couple of days. Patient states her husband checked blood pressure and it was low, also she noticed blood in urine.  She denies any chest pain, and shortness of breath.  States she went to cardiologist in October 2021 and was cardioverted at that time.   Review Of Systems: Per HPI with the following additions:   Review of Systems  Constitutional: Positive for malaise/fatigue. Negative for chills and fever.  HENT: Positive for sore throat.   Eyes: Negative for blurred vision.  Respiratory: Positive for cough. Negative for shortness of breath.   Cardiovascular: Negative for chest pain and leg swelling.  Gastrointestinal: Positive for diarrhea, nausea and vomiting.  Genitourinary: Positive for hematuria. Negative for dysuria.  Musculoskeletal: Negative for falls and myalgias.  Neurological: Positive for weakness and headaches. Negative for loss of consciousness.    Patient Active Problem List   Diagnosis Date Noted   Spinal stenosis of lumbar region with neurogenic claudication 01/19/2019   Sinus node dysfunction (HCC) 09/24/2016   Paroxysmal A-fib (HCC) 10/03/2015   Atypical atrial flutter (HCC) 10/03/2015   S/P MVR (mitral valve repair) 10/03/2015   Dyslipidemia 11/23/2014   Essential hypertension 11/23/2014    Past Medical History: Past Medical History:  Diagnosis Date   Atrial fibrillation (HCC)    Dysrhythmia    H/O mitral valve repair    Hypertension    Presence of permanent cardiac pacemaker    Stroke (HCC)    per patient, "mini stroke in 2005/06"    Past Surgical History: Past Surgical History:  Procedure Laterality Date   ABDOMINAL HYSTERECTOMY     APPENDECTOMY     CATARACT EXTRACTION W/ INTRAOCULAR LENS  IMPLANT Left    CESAREAN SECTION     CHOLECYSTECTOMY     EYE SURGERY     GALLBLADDER SURGERY     LUMBAR LAMINECTOMY/DECOMPRESSION MICRODISCECTOMY N/A 01/19/2019   Procedure: LUMBAR LAMINECTOMY AND FORAMINOTOMY LUMBAR THREE- LUMBAR FOUR, LUMBAR FOUR- LUMBAR FIVE;  Surgeon: Tressie StalkerJenkins, Jeffrey, MD;  Location: [facility] OR;  Service: Neurosurgery;  Laterality: N/A;  LUMBAR LAMINECTOMY AND FORAMINOTOMY LUMBAR THREE- LUMBAR FOUR, LUMBAR FOUR- LUMBAR FIVE   open heart surgery     TONSILLECTOMY      Social History: Social History   Tobacco Use   Smoking status: Never Smoker   Smokeless tobacco: Never Used  Vaping Use   Vaping Use: Never used  Substance Use Topics   Alcohol use: No   Drug use: No   Additional social history:  Please also refer to relevant sections of EMR.  Family History: Family History  Problem Relation Age of Onset   Heart attack Father    Kidney disease Mother    Hypertension Mother     Allergies and Medications: No Known Allergies No current facility-administered medications on file prior to encounter.   Current  Outpatient Medications on File Prior to Encounter  Medication Sig Dispense Refill   acetaminophen (TYLENOL) 650 MG CR tablet Take 650 mg by mouth every 8 (eight) hours as needed for pain.     ALPRAZolam (XANAX) 0.5 MG tablet Take 0.5 mg by mouth daily as needed for anxiety.     apixaban (ELIQUIS) 5 MG TABS tablet Take 5 mg by mouth 2 (two) times a day.      atorvastatin (LIPITOR) 40 MG tablet Take 40 mg by mouth every evening.      Calcium Carbonate-Vit D-Min (CALCIUM 1200) 1200-1000 MG-UNIT CHEW Chew 1 tablet by mouth daily.     Cholecalciferol (DIALYVITE VITAMIN D 5000) 125 MCG (5000 UT) capsule Take 2,000 Units by mouth daily.      cyclobenzaprine (FLEXERIL) 10 MG tablet Take 1 tablet (10 mg total) by mouth 3 (three) times daily as needed for muscle spasms. 30 tablet 0   docusate sodium (COLACE) 100 MG capsule Take 1 capsule (100 mg  total) by mouth 2 (two) times daily. 10 capsule 0   furosemide (LASIX) 20 MG tablet Take 20 mg by mouth daily.     hydrochlorothiazide (HYDRODIURIL) 12.5 MG tablet Take 12.5 mg by mouth daily.     Magnesium Oxide 250 MG TABS Take 250 mg by mouth 2 (two) times daily.      Menthol, Topical Analgesic, (BIOFREEZE EX) Apply 1 application topically daily as needed (pain).     metoprolol tartrate (LOPRESSOR) 25 MG tablet Take 12.5 mg by mouth 2 (two) times a day.      potassium chloride SA (K-DUR,KLOR-CON) 20 MEQ tablet Take 40 mEq by mouth daily.      telmisartan-hydrochlorothiazide (MICARDIS HCT) 40-12.5 MG tablet Take 1 tablet by mouth daily.      Objective: BP 90/64    Pulse 77    Temp 99.2 F (37.3 C) (Rectal)    Resp (!) 22    Wt 72.6 kg    SpO2 98%    BMI 30.23 kg/m  Exam: General: Not in acute distress.  She is ill appearing.  Not toxic appearing Eyes: No congestion or icterus, PERRL, EOMI Neck: Trachea central.  No swelling Cardiovascular: Irregularly Irregular rhythm Respiratory: Clear to auscultation bilaterally, no increased WOB on RA MSK: 1+ dorsalis pedis pulses b/l.  No extremity edema Derm: Warm and dry Neuro: No focal deficit, A&Ox4 Psych: Mood and affect normal  Labs and Imaging: CBC BMET  Recent Labs  Lab 04/01/20 1046 04/01/20 1358  WBC 8.8  --   HGB 14.3 11.9*  HCT 41.8 35.0*  PLT 222  --    Recent Labs  Lab 04/01/20 1046 04/01/20 1358  NA 140 140  K 5.3* 5.2*  CL 112*  --   CO2 8*  --   BUN 152*  --   CREATININE 9.83*  --   GLUCOSE 180*  --   CALCIUM 9.0  --      EKG: Atrial fibrillation Inferior infarct, age indeterminate Abnrm T, consider ischemia, anterolateral lds Prolonged QT interval [id number]  Karsten Ro, MD 04/01/2020, 2:46 PM PGY-1, [facility] FPTS Intern pager: [phone], text pages welcome  FPTS Upper-Level Resident Addendum   I have independently interviewed and examined the patient. I have discussed the  above with the original author and agree with their documentation. My edits for correction/addition/clarification are in green. Please see also any attending notes.   Luis Abed, D.O. PGY-3, [facility] 04/01/2020 5:19 PM  FPTS  Service pager: [phone] (text pages welcome through [facility])

## 2020-04-01 NOTE — ED Provider Notes (Signed)
[facility] EMERGENCY DEPARTMENT Provider Note   CSN: [id number] Arrival date & time: 04/01/20  1026     History No chief complaint on file.   Isabel Hardy is a 69 y.o. female with pertinent past medical history of A. fib, hypertension, CAD that presents emergency department today for shortness of breath.  Per patient she was diagnosed with Covid on 8 December, has been having symptoms for couple weeks.  Patient states that she has been having nausea and vomiting with diarrhea, has not been able to keep anything down for the past couple of weeks. Ppatient slow to respond, hard to converse with at this time, due to hypotension.  Patient did receive monoclonal antibody infusion earlier this week.  Has been vaccinated against Covid.  Denies any chest pain. Able to make urine.   HPI     Past Medical History:  Diagnosis Date  . Atrial fibrillation (HCC)   . Dysrhythmia   . H/O mitral valve repair   . Hypertension   . Presence of permanent cardiac pacemaker   . Stroke [facility])    per patient, "mini stroke in 2005/06"    Patient Active Problem List   Diagnosis Date Noted  . Spinal stenosis of lumbar region with neurogenic claudication 01/19/2019  . Sinus node dysfunction (HCC) 09/24/2016  . Paroxysmal A-fib (HCC) 10/03/2015  . Atypical atrial flutter (HCC) 10/03/2015  . S/P MVR (mitral valve repair) 10/03/2015  . Dyslipidemia 11/23/2014  . Essential hypertension 11/23/2014    Past Surgical History:  Procedure Laterality Date  . ABDOMINAL HYSTERECTOMY    . APPENDECTOMY    . CATARACT EXTRACTION W/ INTRAOCULAR LENS IMPLANT Left   . CESAREAN SECTION    . CHOLECYSTECTOMY    . EYE SURGERY    . GALLBLADDER SURGERY    . LUMBAR LAMINECTOMY/DECOMPRESSION MICRODISCECTOMY N/A 01/19/2019   Procedure: LUMBAR LAMINECTOMY AND FORAMINOTOMY LUMBAR THREE- LUMBAR FOUR, LUMBAR FOUR- LUMBAR FIVE;  Surgeon: Tressie Stalker, MD;  Location: [facility] OR;  Service: Neurosurgery;   Laterality: N/A;  LUMBAR LAMINECTOMY AND FORAMINOTOMY LUMBAR THREE- LUMBAR FOUR, LUMBAR FOUR- LUMBAR FIVE  . open heart surgery    . TONSILLECTOMY       OB History   No obstetric history on file.     Family History  Problem Relation Age of Onset  . Heart attack Father   . Kidney disease Mother   . Hypertension Mother     Social History   Tobacco Use  . Smoking status: Never Smoker  . Smokeless tobacco: Never Used  Vaping Use  . Vaping Use: Never used  Substance Use Topics  . Alcohol use: No  . Drug use: No    Home Medications Prior to Admission medications   Medication Sig Start Date End Date Taking? Authorizing Provider  acetaminophen (TYLENOL) 650 MG CR tablet Take 650 mg by mouth every 8 (eight) hours as needed for pain.   Yes [provider]  ALPRAZolam Prudy Feeler) 0.5 MG tablet Take 0.5 mg by mouth daily as needed for anxiety.   Yes [provider]  apixaban (ELIQUIS) 5 MG TABS tablet Take 5 mg by mouth 2 (two) times a day.  06/01/18  Yes [provider]  atorvastatin (LIPITOR) 40 MG tablet Take 40 mg by mouth every evening.  06/15/15  Yes [provider]  Calcium Carbonate-Vit D-Min (CALCIUM 1200) 1200-1000 MG-UNIT CHEW Chew 1 tablet by mouth daily.   Yes [provider]  Cholecalciferol (DIALYVITE VITAMIN D 5000) 125  MCG (5000 UT) capsule Take 2,000 Units by mouth daily.    Yes [provider]  cyclobenzaprine (FLEXERIL) 10 MG tablet Take 1 tablet (10 mg total) by mouth 3 (three) times daily as needed for muscle spasms. 01/20/19  Yes Val Eagle D, NP  docusate sodium (COLACE) 100 MG capsule Take 1 capsule (100 mg total) by mouth 2 (two) times daily. 01/20/19  Yes Val Eagle D, NP  furosemide (LASIX) 20 MG tablet Take 20 mg by mouth daily. 06/15/15  Yes [provider]  hydrochlorothiazide (HYDRODIURIL) 12.5 MG tablet Take 12.5 mg by mouth daily.   Yes [provider]  Magnesium Oxide 250 MG TABS  Take 250 mg by mouth 2 (two) times daily.    Yes [provider]  Menthol, Topical Analgesic, (BIOFREEZE EX) Apply 1 application topically daily as needed (pain).   Yes [provider]  metoprolol tartrate (LOPRESSOR) 25 MG tablet Take 12.5 mg by mouth 2 (two) times a day.  03/10/18  Yes [provider]  potassium chloride SA (K-DUR,KLOR-CON) 20 MEQ tablet Take 40 mEq by mouth daily.    Yes [provider]  telmisartan-hydrochlorothiazide (MICARDIS HCT) 40-12.5 MG tablet Take 1 tablet by mouth daily.   Yes [provider]    Allergies    Patient has no known allergies.  Review of Systems   Review of Systems  Constitutional: Positive for fatigue. Negative for chills, diaphoresis and fever.  HENT: Negative for congestion, sore throat and trouble swallowing.   Eyes: Negative for pain and visual disturbance.  Respiratory: Positive for shortness of breath. Negative for cough and wheezing.   Cardiovascular: Negative for chest pain, palpitations and leg swelling.  Gastrointestinal: Negative for abdominal distention, abdominal pain, diarrhea, nausea and vomiting.  Genitourinary: Negative for difficulty urinating.  Musculoskeletal: Positive for myalgias. Negative for back pain, neck pain and neck stiffness.  Skin: Negative for pallor.  Neurological: Positive for light-headedness. Negative for dizziness, speech difficulty, weakness and headaches.  Psychiatric/Behavioral: Negative for confusion.    Physical Exam Updated Vital Signs BP 90/64   Pulse 77   Temp 99.2 F (37.3 C) (Rectal)   Resp (!) 22   Wt 72.6 kg   SpO2 98%   BMI 30.23 kg/m   Physical Exam Constitutional:      General: She is not in acute distress.    Appearance: She is ill-appearing. She is not toxic-appearing or diaphoretic.     Comments: Patient is a 69 year old female, pale and slow to respond.  Will slowly follow commands, no true acute distress.  No respiratory distress.   HENT:     Mouth/Throat:     Mouth: Mucous membranes are moist.     Pharynx: Oropharynx is clear.  Eyes:     General: No scleral icterus.    Extraocular Movements: Extraocular movements intact.     Pupils: Pupils are equal, round, and reactive to light.  Cardiovascular:     Rate and Rhythm: Normal rate and regular rhythm.     Pulses: Normal pulses.     Heart sounds: Normal heart sounds.  Pulmonary:     Effort: Pulmonary effort is normal. No respiratory distress.     Breath sounds: Normal breath sounds. No stridor. No wheezing, rhonchi or rales.  Chest:     Chest wall: No tenderness.  Abdominal:     General: Abdomen is flat. There is no distension.     Palpations: Abdomen is soft.     Tenderness: There is no  abdominal tenderness. There is no guarding or rebound.  Musculoskeletal:        General: No swelling or tenderness. Normal range of motion.     Cervical back: Normal range of motion and neck supple. No rigidity.     Right lower leg: No edema.     Left lower leg: No edema.  Skin:    General: Skin is warm and dry.     Capillary Refill: Capillary refill takes less than 2 seconds.     Coloration: Skin is pale.  Neurological:     General: No focal deficit present.     Mental Status: She is alert and oriented to person, place, and time.  Psychiatric:        Mood and Affect: Mood normal.        Behavior: Behavior normal.     ED Results / Procedures / Treatments   Labs (all labs ordered are listed, but only abnormal results are displayed) Labs Reviewed  COMPREHENSIVE METABOLIC PANEL - Abnormal; Notable for the following components:      Result Value   Potassium 5.3 (*)    Chloride 112 (*)    CO2 8 (*)    Glucose, Bld 180 (*)    BUN 152 (*)    Creatinine, Ser 9.83 (*)    Total Protein 6.2 (*)    Albumin 3.2 (*)    Total Bilirubin 1.3 (*)    GFR, Estimated 4 (*)    Anion gap 20 (*)    All other components within normal limits  CBC WITH DIFFERENTIAL/PLATELET -  Abnormal; Notable for the following components:   Abs Immature Granulocytes 0.08 (*)    All other components within normal limits  LACTATE DEHYDROGENASE - Abnormal; Notable for the following components:   LDH 367 (*)    All other components within normal limits  TRIGLYCERIDES - Abnormal; Notable for the following components:   Triglycerides 228 (*)    All other components within normal limits  C-REACTIVE PROTEIN - Abnormal; Notable for the following components:   CRP 6.3 (*)    All other components within normal limits  D-DIMER, QUANTITATIVE (NOT AT [facility]) - Abnormal; Notable for the following components:   D-Dimer, Quant 0.66 (*)    All other components within normal limits  FERRITIN - Abnormal; Notable for the following components:   Ferritin 1,354 (*)    All other components within normal limits  FIBRINOGEN - Abnormal; Notable for the following components:   Fibrinogen 754 (*)    All other components within normal limits  I-STAT VENOUS BLOOD GAS, ED - Abnormal; Notable for the following components:   pCO2, Ven 15.7 (*)    pO2, Ven 194.0 (*)    Bicarbonate 9.1 (*)    TCO2 10 (*)    Acid-base deficit 14.0 (*)    Potassium 5.2 (*)    Calcium, Ion 0.86 (*)    HCT 35.0 (*)    Hemoglobin 11.9 (*)    All other components within normal limits  TROPONIN I (HIGH SENSITIVITY) - Abnormal; Notable for the following components:   Troponin I (High Sensitivity) 47 (*)    All other components within normal limits  TROPONIN I (HIGH SENSITIVITY) - Abnormal; Notable for the following components:   Troponin I (High Sensitivity) 46 (*)    All other components within normal limits  CULTURE, BLOOD (ROUTINE X 2)  CULTURE, BLOOD (ROUTINE X 2)  LACTIC ACID, PLASMA  LACTIC ACID, PLASMA  PROCALCITONIN  URINALYSIS,  ROUTINE W REFLEX MICROSCOPIC  CBC WITH DIFFERENTIAL/PLATELET  BLOOD GAS, VENOUS    EKG None  Radiology CT Abdomen Pelvis Wo Contrast  Result Date: 04/01/2020 CLINICAL DATA:   Kidney failure EXAM: CT ABDOMEN AND PELVIS WITHOUT CONTRAST TECHNIQUE: Multidetector CT imaging of the abdomen and pelvis was performed following the standard protocol without IV contrast. COMPARISON:  CT abdomen pelvis 07/30/2016 FINDINGS: Lower chest: Motion limited. Multifocal opacities in the bilateral lung bases. Enlarged heart size. Evaluation of the abdominal viscera limited by the lack of IV contrast. Hepatobiliary: No focal liver abnormality is seen. Status post cholecystectomy. \ Pancreas: Fatty replaced.  No surrounding inflammatory change. Spleen: Normal in size without focal abnormality. Adrenals/Urinary Tract: Adrenal glands are unremarkable. No hydronephrosis. No renal calculi. Atrophic right kidney. No large renal mass. Urinary bladder is unremarkable. Stomach/Bowel: Stomach is within normal limits. No evidence of bowel wall thickening, distention, or inflammatory changes. Vascular/Lymphatic: Aortic atherosclerosis. Vascular patency cannot be assessed in the absence of IV contrast. No enlarged abdominal or pelvic lymph nodes. Reproductive: Status post hysterectomy. No adnexal masses. Other: No abdominal wall hernia. 3.7 cm soft tissue density in the lateral right anterior abdominal wall is unchanged. Musculoskeletal: Interval increase in size of several sclerotic foci in the inferior left pubic ramus, right acetabulum and right sacrum, consistent with benign enostoses. Other scattered sclerotic foci are not significantly changed. Degenerative changes throughout the thoracolumbar spine. IMPRESSION: 1. Multifocal opacities in the visualized bilateral lung bases, concerning for bilateral pneumonia. 2. Atrophic right kidney. No renal calculi or hydronephrosis. 3. Aortic atherosclerosis. Aortic Atherosclerosis (ICD10-I70.0). Electronically Signed   By: Emmaline Kluver M.D.   On: 04/01/2020 13:47   US RENAL  Result Date: 04/01/2020 CLINICAL DATA:  AKI EXAM: RENAL / URINARY TRACT ULTRASOUND COMPLETE  COMPARISON:  04/29/2017 and prior. FINDINGS: Right Kidney: Renal measurements: 7.5 x 3.1 x 4.1 cm = volume: 50.1 mL. Increased parenchymal echogenicity. No mass or hydronephrosis visualized. Left Kidney: Renal measurements: 12.2 x 5.0 x 5.6 cm = volume: 180.3 mL. Echogenicity within normal limits. No mass or hydronephrosis visualized. Bladder: Appears normal for degree of bladder distention. Other: None. IMPRESSION: Echogenic, atrophic right kidney may reflect sequela of medical renal disease. Normal sonographic appearance of the left kidney. Electronically Signed   By: Stana Bunting M.D.   On: 04/01/2020 13:33   DG Chest Portable 1 View  Result Date: 04/01/2020 CLINICAL DATA:  Weakness, COVID-19 positive. EXAM: PORTABLE CHEST 1 VIEW COMPARISON:  May 25, 2004. FINDINGS: Stable cardiomegaly. 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 No pneumothorax or pleural effusion is noted. Left lung is clear. Right infrahilar opacity is noted concerning for possible pneumonia. Bony thorax is unremarkable. IMPRESSION: Right infrahilar opacity is noted concerning for possible pneumonia. Followup PA and lateral chest X-ray is recommended in 3-4 weeks following trial of antibiotic therapy to ensure resolution and exclude underlying malignancy. Electronically Signed   By: Lupita Raider M.D.   On: 04/01/2020 11:37    Procedures .Critical Care Performed by: Farrel Gordon, PA-C Authorized by: Farrel Gordon, PA-C   Critical care provider statement:    Critical care time (minutes):  45   Critical care was necessary to treat or prevent imminent or life-threatening deterioration of the following conditions:  Shock and respiratory failure   Critical care was time spent personally by me on the following activities:  Discussions with consultants, evaluation of patient's response to treatment, examination of patient, ordering and performing  treatments and interventions, ordering and review of laboratory studies, ordering and review of radiographic studies, pulse oximetry, re-evaluation of patient's condition, obtaining history from patient or surrogate and review of old charts   (including critical care time)  Medications Ordered in ED Medications  azithromycin (ZITHROMAX) 500 mg in sodium chloride 0.9 % 250 mL IVPB (500 mg Intravenous New Bag/Given 04/01/20 1404)  lactated ringers infusion ( Intravenous New Bag/Given 04/01/20 1404)  sodium chloride 0.9 % bolus 1,000 mL (0 mLs Intravenous Stopped 04/01/20 1236)  cefTRIAXone (ROCEPHIN) 1 g in sodium chloride 0.9 % 100 mL IVPB (0 g Intravenous Stopped 04/01/20 1311)  sodium bicarbonate injection 50 mEq (50 mEq Intravenous Given 04/01/20 1231)  sodium zirconium cyclosilicate (LOKELMA) packet 5 g (5 g Oral Given 04/01/20 1406)  lactated ringers bolus 1,000 mL (1,000 mLs Intravenous New Bag/Given 04/01/20 1409)    ED Course  I have reviewed the triage vital signs and the nursing notes.  Pertinent labs & imaging results that were available during my care of the patient were reviewed by me and considered in my medical decision making (see chart for details).    MDM Rules/Calculators/A&P                         Janeece FittingCarolyn R Fregeau is a 69 y.o. female with pertinent past medical history of A. fib, hypertension, CAD that presents emergency department today for shortness of breath.  Patient hypotensive with slow mentation, no focal neuro deficits.  Will order Covid work-up at this time and initiate fluid.  Slow mentation most likely from hypotension.  Upon reassessment patient's blood pressure has now improved to 87 systolic, mentating better.  Still awaiting labs.  Labs with increased inflammatory markers for Covid, CMP also significant for creatinine of 9.83 with BUN of 152.  Potassium 5.3.  Patient is still able to make urine.  Will consult nephrology for kidney failure at this time.   Mentation could also be from elevated BUN, however with marked improvement with IV fluids most likely from hypotension.  Chest x-ray does show questionable lobar pneumonia, will start antibiotics for this at this time, could be superimposed infection will with Covid.  Spoke to nephrology, Dr. Thedore MinsSingh, who recommended [facility] at this time, does not recommend dialysis emergently, however does recommend lactated Ringer's and he will consult.  Patient rapidly improving, blood pressure 104 systolic, mentation back to baseline.  Patient has received 1-1/2 L of fluids.  Will admit to the hospitalist at this time.  [facility] who will [facility] the patient.   The patient appears reasonably stabilized for admission considering the current resources, flow, and capabilities available in the ED at this time, and I doubt any other [facility] requiring further screening and/or treatment in the ED prior to admission.  I discussed this case with my attending physician who cosigned this note including patient's presenting symptoms, physical exam, and planned diagnostics and interventions. Attending physician stated agreement with plan or made changes to plan which were implemented.   Attending physician assessed patient at bedside.   Final Clinical Impression(s) / ED Diagnoses Final diagnoses:  Hypotension due to hypovolemia  Acute renal failure, unspecified acute renal failure type [facility](HCC)  COVID    Rx / DC Orders ED Discharge Orders    None       Farrel Gordonatel, Driana Dazey, PA-C 04/01/20 1539    Long, Arlyss RepressJoshua G, MD 04/04/20 1238

## 2020-04-01 NOTE — Hospital Course (Addendum)
Assessment and Plan: Isabel Hardy is a 69 y.o. female presenting with Hypotention and fatigue. Dx of COVID on 03/28/20. PMH is significant for Afib on Eliquis, HTN, HLD, Sinus node dysfunction, Lumber Spinal stenosis.    Hypotension Nausea/Vomitting/diarrhoea Pt was managed with IV fluids and her vitals were monitored.  Acute Renal Failure  Pt admitted with creatinine of 9.83. Nephrology consulted and recommended supportive treatment with IV fluids and starting sodium bicarbonate. Patient creatinine improved to 1.81 at discharge.   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 Fib. Pt heart rate was monitored and started on her home medications metoprolol and Multaq. She continued to be in A. Fib. Metoprol was increased to 25 mg BID. Her heart rate was controlled at the time of discharge.  COVID Positive Pneumonia Pt was noted to be COVID positive on 03/28/2020. S/P MAB infusion. Chest X ray showed Right infrahilar opacity concerning for possible pneumonia. CT abdomen/pelvis confirmed multifocal bilateral pneumonia. Patient was given 1 dose of ceftriaxone and azithromycin for pneumonia but it was discontinued as Pt's O2 saturation remain stable and she didn't need O2 during her stay.  HTN Patient BPs remained stable. Pt home meds (HCTZ and Telmisartan-hydrochlorothiazide were held because of AKI.   Pt's other medical problems were stable.

## 2020-04-01 NOTE — ED Notes (Signed)
On purewick

## 2020-04-01 NOTE — ED Notes (Signed)
Patient given dinner tray.

## 2020-04-01 NOTE — ED Notes (Signed)
Taken to CT, [place] completed

## 2020-04-02 ENCOUNTER — Encounter (HOSPITAL_COMMUNITY): Payer: Self-pay | Admitting: Family Medicine

## 2020-04-02 DIAGNOSIS — N179 Acute kidney failure, unspecified: Secondary | ICD-10-CM

## 2020-04-02 DIAGNOSIS — I484 Atypical atrial flutter: Secondary | ICD-10-CM

## 2020-04-02 DIAGNOSIS — N261 Atrophy of kidney (terminal): Secondary | ICD-10-CM | POA: Diagnosis present

## 2020-04-02 DIAGNOSIS — J1282 Pneumonia due to coronavirus disease 2019: Secondary | ICD-10-CM

## 2020-04-02 DIAGNOSIS — R400 Somnolence: Secondary | ICD-10-CM

## 2020-04-02 DIAGNOSIS — Z950 Presence of cardiac pacemaker: Secondary | ICD-10-CM | POA: Diagnosis present

## 2020-04-02 DIAGNOSIS — R319 Hematuria, unspecified: Secondary | ICD-10-CM

## 2020-04-02 DIAGNOSIS — E861 Hypovolemia: Secondary | ICD-10-CM

## 2020-04-02 DIAGNOSIS — R8281 Pyuria: Secondary | ICD-10-CM | POA: Diagnosis present

## 2020-04-02 DIAGNOSIS — N170 Acute kidney failure with tubular necrosis: Secondary | ICD-10-CM | POA: Diagnosis present

## 2020-04-02 DIAGNOSIS — I9589 Other hypotension: Secondary | ICD-10-CM

## 2020-04-02 DIAGNOSIS — U071 COVID-19: Principal | ICD-10-CM | POA: Diagnosis present

## 2020-04-02 DIAGNOSIS — I700 Atherosclerosis of aorta: Secondary | ICD-10-CM | POA: Diagnosis present

## 2020-04-02 DIAGNOSIS — I959 Hypotension, unspecified: Secondary | ICD-10-CM

## 2020-04-02 HISTORY — DX: Hypotension, unspecified: I95.9

## 2020-04-02 HISTORY — DX: Pyuria: R82.81

## 2020-04-02 HISTORY — DX: Atrophy of kidney (terminal): N26.1

## 2020-04-02 HISTORY — DX: Acute kidney failure with tubular necrosis: N17.0

## 2020-04-02 HISTORY — DX: Atherosclerosis of aorta: I70.0

## 2020-04-02 HISTORY — DX: Pneumonia due to coronavirus disease 2019: J12.82

## 2020-04-02 HISTORY — DX: Hematuria, unspecified: R31.9

## 2020-04-02 HISTORY — DX: Hypovolemia: E86.1

## 2020-04-02 HISTORY — DX: Somnolence: R40.0

## 2020-04-02 HISTORY — DX: COVID-19: U07.1

## 2020-04-02 LAB — BLOOD CULTURE ID PANEL (REFLEXED) - BCID2

## 2020-04-02 LAB — CBC WITH DIFFERENTIAL/PLATELET
Abs Immature Granulocytes: 0.08 10*3/uL — ABNORMAL HIGH (ref 0.00–0.07)
Basophils Absolute: 0 10*3/uL (ref 0.0–0.1)
Basophils Relative: 1 %
Eosinophils Absolute: 0.2 10*3/uL (ref 0.0–0.5)
Eosinophils Relative: 4 %
HCT: 36.1 % (ref 36.0–46.0)
Hemoglobin: 12.6 g/dL (ref 12.0–15.0)
Immature Granulocytes: 2 %
Lymphocytes Relative: 12 %
Lymphs Abs: 0.6 10*3/uL — ABNORMAL LOW (ref 0.7–4.0)
MCH: 30.7 pg (ref 26.0–34.0)
MCHC: 34.9 g/dL (ref 30.0–36.0)
MCV: 88 fL (ref 80.0–100.0)
Monocytes Absolute: 0.5 10*3/uL (ref 0.1–1.0)
Monocytes Relative: 10 %
Neutro Abs: 4 10*3/uL (ref 1.7–7.7)
Neutrophils Relative %: 71 %
Platelets: 191 10*3/uL (ref 150–400)
RBC: 4.1 MIL/uL (ref 3.87–5.11)
RDW: 13.6 % (ref 11.5–15.5)
WBC: 5.5 10*3/uL (ref 4.0–10.5)
nRBC: 0 % (ref 0.0–0.2)

## 2020-04-02 LAB — COMPREHENSIVE METABOLIC PANEL
ALT: 20 U/L (ref 0–44)
AST: 27 U/L (ref 15–41)
Albumin: 2.8 g/dL — ABNORMAL LOW (ref 3.5–5.0)
Alkaline Phosphatase: 62 U/L (ref 38–126)
Anion gap: 14 (ref 5–15)
BUN: 123 mg/dL — ABNORMAL HIGH (ref 8–23)
CO2: 12 mmol/L — ABNORMAL LOW (ref 22–32)
Calcium: 8.8 mg/dL — ABNORMAL LOW (ref 8.9–10.3)
Chloride: 121 mmol/L — ABNORMAL HIGH (ref 98–111)
Creatinine, Ser: 5.97 mg/dL — ABNORMAL HIGH (ref 0.44–1.00)
GFR, Estimated: 7 mL/min — ABNORMAL LOW (ref >60)
Glucose, Bld: 112 mg/dL — ABNORMAL HIGH (ref 70–99)
Potassium: 4.3 mmol/L (ref 3.5–5.1)
Sodium: 147 mmol/L — ABNORMAL HIGH (ref 135–145)
Total Bilirubin: 1.2 mg/dL (ref 0.3–1.2)
Total Protein: 5.5 g/dL — ABNORMAL LOW (ref 6.5–8.1)

## 2020-04-02 LAB — LIPASE, BLOOD: Lipase: 57 U/L — ABNORMAL HIGH (ref 11–51)

## 2020-04-02 LAB — HIV ANTIBODY (ROUTINE TESTING W REFLEX): HIV Screen 4th Generation wRfx: NONREACTIVE

## 2020-04-02 MED ORDER — DEXTROSE-NACL 5-0.45 % IV SOLN: INTRAVENOUS | Status: DC

## 2020-04-02 MED ORDER — METOPROLOL TARTRATE 12.5 MG HALF TABLET
12.5000 mg | ORAL_TABLET | Freq: Two times a day (BID) | ORAL | Status: DC
  Administered 2020-04-02 – 2020-04-03 (×3): 12.5 mg via ORAL
  Filled 2020-04-02 (×3): qty 1

## 2020-04-02 MED ORDER — ATORVASTATIN CALCIUM 40 MG PO TABS
40.0000 mg | ORAL_TABLET | Freq: Every evening | ORAL | Status: DC
Start: 2020-04-02 — End: 2020-04-06
  Administered 2020-04-02 – 2020-04-04 (×3): 40 mg via ORAL
  Filled 2020-04-02 (×3): qty 1

## 2020-04-02 NOTE — Progress Notes (Addendum)
Occupational Therapy  PTA, pt lived at home with her husband and was independent with ADL and mobility, drove and did her own medication management. Pt unaware of being incontinent of BM on entry to room. Pt with slow processing and decreased initiation for any task, requiring min A +2 for mobility and Max A for ADL tasks due to below listed deficits. SpO2 in the 90s during session with minimal SOB. HR 108 - 138. BP 146/104. Hopeful pt will improve cognitively and be able to DC home with 24/7 S and HHOT. Will follow acutely.     04/02/20 0900  OT Visit Information  Last OT Received On 04/02/20  Assistance Needed +1  PT/OT/SLP Co-Evaluation/Treatment Yes (partial session)  Reason for Co-Treatment To address functional/ADL transfers;Necessary to address cognition/behavior during functional activity  OT goals addressed during session ADL's and self-care  History of Present Illness 69 y.o. female presented 04/01/20 with Hypotention and fatigue. Dx of COVID on 03/28/20. acute on chronic kidney disease. PMH is significant for Afib on Eliquis, HTN, HLD, Sinus node dysfunction, Lumber Spinal stenosis.  Precautions  Precautions Fall  Home Living  Family/patient expects to be discharged to: Private residence  Living Arrangements Spouse/significant other  Available Help at Discharge Family;Available 24 hours/day  Type of Home House  Home Access Level entry  Home Layout One level  Bathroom Shower/Tub Tub/shower unit;Door  Horticulturist, commercial Yes  How Accessible Accessible via walker  Home Equipment None  Prior Function  Level of Independence Independent  Comments drives; does her own medication management  Communication  Communication Expressive difficulties (very slow; word salad at times)  Pain Assessment  Pain Assessment Faces  Faces Pain Scale 4  Pain Location with wiping bottom  Pain Descriptors / Indicators Grimacing;Guarding  Pain Intervention(s) Limited  activity within patient's tolerance  Cognition  Arousal/Alertness Lethargic  Behavior During Therapy Flat affect  Overall Cognitive Status Impaired/Different from baseline  Area of Impairment Orientation;Attention;Memory;Safety/judgement;Awareness;Problem solving  Orientation Level Disoriented to;Time (October 2014)  Current Attention Level Selective  Memory Decreased short-term memory  Safety/Judgement Decreased awareness of safety;Decreased awareness of deficits  Awareness Emergent  Problem Solving Slow processing  General Comments Requires tactile cues to follow commands; unaware of being soiled/sitting in BM; stated she no longer had diarrhea but has apparently had 2 episodes today  Upper Extremity Assessment  Upper Extremity Assessment Generalized weakness  Lower Extremity Assessment  Lower Extremity Assessment Defer to PT evaluation  Cervical / Trunk Assessment  Cervical / Trunk Assessment Normal  ADL  Overall ADL's  Needs assistance/impaired  Grooming Minimal assistance  Grooming Details (indicate cue type and reason) requires cues to start activity, requiring toothpaste to be put on brush adn toothbrush placed in hand  Upper Body Bathing Moderate assistance;Sitting  Lower Body Bathing Maximal assistance;Sit to/from stand  Upper Body Dressing  Minimal assistance;Sitting  Lower Body Dressing Maximal assistance;Sit to/from Careers information officer for physical assistance  Toilet Transfer Details (indicate cue type and reason) simulated  Toileting- Clothing Manipulation and Hygiene Total assistance  Toileting - Clothing Manipulation Details (indicate cue type and reason) incontinent of BM; total A to clean; pt unaware of being soiled; buttocks red/moisture barrier cream applied  Functional mobility during ADLs Minimal assistance;Cueing for safety;Cueing for sequencing;+2 for physical assistance  Bed Mobility  Overal bed mobility Needs Assistance  Bed  Mobility Supine to Sit  Supine to sit Mod assist;+2 for safety/equipment  General bed mobility comments Pt would not initiate  sitting EOB after given verbal cues; required use of tactile cues and physical assist to sit  Transfers  Overall transfer level Needs assistance  Equipment used 2 person hand held assist  Transfers Sit to/from [employer] Transfers  Sit to Stand Min assist;+2 physical assistance  Stand pivot transfers Min assist;+2 physical assistance  General transfer comment Pt reached for each therapist's hands  Balance  Overall balance assessment Needs assistance  Sitting balance-Leahy Scale Fair  Standing balance-Leahy Scale Poor  General Comments  General comments (skin integrity, edema, etc.) SpO2 in 90s on RA; 1/4 DOE with minimal activity; HR 108 - 138  OT - End of Session  Equipment Utilized During Treatment Gait belt  Activity Tolerance Patient tolerated treatment well  Patient left in chair;with call bell/phone within reach;with chair alarm set  Nurse Communication Mobility status  OT Assessment  OT Recommendation/Assessment Patient needs continued OT Services  OT Visit Diagnosis Unsteadiness on feet (R26.81);Other abnormalities of gait and mobility (R26.89);Muscle weakness (generalized) (M62.81);Other symptoms and signs involving cognitive function;Pain  Pain - part of body  (bottom - from frequent BMs)  OT Problem List Decreased strength;Decreased activity tolerance;Impaired balance (sitting and/or standing);Decreased cognition;Decreased safety awareness;Decreased knowledge of use of DME or AE;Cardiopulmonary status limiting activity;Pain  OT Plan  OT Frequency (ACUTE ONLY) Min 2X/week  OT Treatment/Interventions (ACUTE ONLY) Self-care/ADL training;Therapeutic exercise;Neuromuscular education;Energy conservation;DME and/or AE instruction;Therapeutic activities;Cognitive remediation/compensation;Patient/family education;Balance training  AM-PAC OT "6 Clicks"  Daily Activity Outcome Measure (Version 2)  Help from another person eating meals? 3  Help from another person taking care of personal grooming? 3  Help from another person toileting, which includes using toliet, bedpan, or urinal? 1  Help from another person bathing (including washing, rinsing, drying)? 2  Help from another person to put on and taking off regular upper body clothing? 2  Help from another person to put on and taking off regular lower body clothing? 2  6 Click Score 13  OT Recommendation  Follow Up Recommendations Home health OT;Supervision/Assistance - 24 hour  OT Equipment 3 in 1 bedside commode  Individuals Consulted  Consulted and Agree with Results and Recommendations Patient unable/family or caregiver not available  Acute Rehab OT Goals  Patient Stated Goal Did not state  OT Goal Formulation Patient unable to participate in goal setting  Time For Goal Achievement 04/16/20  Potential to Achieve Goals Good  OT Time Calculation  OT Start Time (ACUTE ONLY) 0933  OT Stop Time (ACUTE ONLY) 1012  OT Time Calculation (min) 39 min  OT General Charges  $OT Visit 1 Visit  OT Evaluation  $OT Eval Moderate Complexity 1 Mod  OT Treatments  $Self Care/Home Management  8-22 mins  Written Expression  Dominant Hand Right  Luisa Dago, OT/L   Acute OT Clinical Specialist Acute Rehabilitation Services Pager [phone] Office [phone]

## 2020-04-02 NOTE — Evaluation (Signed)
Physical Therapy Evaluation Patient Details Name: Isabel Hardy MRN: [id number] DOB: 05/15/1950 Today's Date: 04/02/2020   History of Present Illness  69 y.o. female presented 04/01/20 with Hypotension and fatigue. Dx of COVID on 03/28/20. acute on chronic kidney disease. PMH is significant for Afib on Eliquis, HTN, HLD, Sinus node dysfunction, Lumber Spinal stenosis.  Clinical Impression   Pt admitted with above diagnosis. Patient apparently independent prior to illness. Currently with decr cognition and delayed responses verbally and motorically. She was unaware she had been incontinent of bowels.  Pt currently with functional limitations due to the deficits listed below (see PT Problem List). Pt will benefit from skilled PT to increase their independence and safety with mobility to allow discharge to the venue listed below (if cognition improves--if not may need to consider SNF).       Follow Up Recommendations Home health PT;Supervision/Assistance - 24 hour due to decr cognition    Equipment Recommendations  Rolling walker with 5" wheels    Recommendations for Other Services       Precautions / Restrictions Precautions Precautions: Fall      Mobility  Bed Mobility Overal bed mobility: Needs Assistance Bed Mobility: Supine to Sit     Supine to sit: Mod assist;+2 for safety/equipment     General bed mobility comments: Pt would not initiate sitting EOB after given verbal cues and pt able to verbalize what she had been asked to do; required use of tactile cues and physical assist to sit    Transfers Overall transfer level: Needs assistance Equipment used: 2 person hand held assist Transfers: Sit to/from Stand;Stand Pivot Transfers Sit to Stand: Min assist;+2 physical assistance Stand pivot transfers: Min assist;+2 physical assistance       General transfer comment: Pt reached for each therapist's hands  Ambulation/Gait             General Gait Details:  pivotal steps only bed to chair; decr balance, decr safety awareness  Stairs            Wheelchair Mobility    Modified Rankin (Stroke Patients Only)       Balance Overall balance assessment: Needs assistance   Sitting balance-Leahy Scale: Fair       Standing balance-Leahy Scale: Poor                               Pertinent Vitals/Pain Pain Assessment: Faces Faces Pain Scale: Hurts little more Pain Location: with wiping bottom Pain Descriptors / Indicators: Grimacing;Guarding Pain Intervention(s): Limited activity within patient's tolerance;Monitored during session    Home Living Family/patient expects to be discharged to:: Private residence Living Arrangements: Spouse/significant other Available Help at Discharge: Family;Available 24 hours/day Type of Home: House Home Access: Level entry     Home Layout: One level Home Equipment: None      Prior Function Level of Independence: Independent         Comments: drives; does her own medication management     Hand Dominance   Dominant Hand: Right    Extremity/Trunk Assessment   Upper Extremity Assessment Upper Extremity Assessment: Defer to OT evaluation    Lower Extremity Assessment Lower Extremity Assessment: Generalized weakness    Cervical / Trunk Assessment Cervical / Trunk Assessment: Normal  Communication   Communication: Expressive difficulties (very slow; word salad at times)  Cognition Arousal/Alertness: Lethargic Behavior During Therapy: Flat affect Overall Cognitive Status: Impaired/Different from baseline Area of Impairment:  Orientation;Attention;Memory;Safety/judgement;Awareness;Problem solving;Following commands                 Orientation Level: Disoriented to;Time (October 2014) Current Attention Level: Selective Memory: Decreased short-term memory Following Commands: Follows one step commands with increased time;Follows multi-step commands with increased  time Safety/Judgement: Decreased awareness of safety;Decreased awareness of deficits Awareness: Emergent Problem Solving: Slow processing General Comments: Requires tactile cues to follow commands; unaware of being soiled/sitting in BM; stated she no longer had diarrhea but has apparently had 2 episodes today      General Comments General comments (skin integrity, edema, etc.): SpO2 in 90s on RA; 1/4 DOE with minimal activity; HR 108 - 138    Exercises     Assessment/Plan    PT Assessment Patient needs continued PT services  PT Problem List Decreased strength;Decreased activity tolerance;Decreased balance;Decreased mobility;Decreased cognition;Decreased knowledge of use of DME;Decreased safety awareness;Decreased knowledge of precautions;Cardiopulmonary status limiting activity;Pain       PT Treatment Interventions DME instruction;Gait training;Functional mobility training;Therapeutic activities;Therapeutic exercise;Balance training;Cognitive remediation;Patient/family education    PT Goals (Current goals can be found in the Care Plan section)  Acute Rehab PT Goals Patient Stated Goal: Did not state PT Goal Formulation: Patient unable to participate in goal setting Time For Goal Achievement: 04/16/20 Potential to Achieve Goals: Good    Frequency Min 3X/week   Barriers to discharge        Co-evaluation PT/OT/SLP Co-Evaluation/Treatment: Yes Reason for Co-Treatment: Necessary to address cognition/behavior during functional activity;To address functional/ADL transfers PT goals addressed during session: Mobility/safety with mobility;Balance         AM-PAC PT "6 Clicks" Mobility  Outcome Measure Help needed turning from your back to your side while in a flat bed without using bedrails?: A Little Help needed moving from lying on your back to sitting on the side of a flat bed without using bedrails?: A Lot Help needed moving to and from a bed to a chair (including a  wheelchair)?: A Lot Help needed standing up from a chair using your arms (e.g., wheelchair or bedside chair)?: A Little Help needed to walk in hospital room?: A Lot Help needed climbing 3-5 steps with a railing? : Total 6 Click Score: 13    End of Session   Activity Tolerance: Patient limited by lethargy;Treatment limited secondary to medical complications (Comment) (elevated HR) Patient left: in chair;with chair alarm set;with call bell/phone within reach Nurse Communication: Mobility status PT Visit Diagnosis: Unsteadiness on feet (R26.81);Muscle weakness (generalized) (M62.81)    Time: 0940-1010 PT Time Calculation (min) (ACUTE ONLY): 30 min   Charges:   PT Evaluation $PT Eval Moderate Complexity: 1 Mod           Jerolyn Center, PT Pager [phone]   Zena Amos 04/02/2020, 4:37 PM

## 2020-04-02 NOTE — Progress Notes (Addendum)
   04/02/20 1622  Assess: MEWS Score  Temp 98.5 F (36.9 C)  BP (!) 157/103  Pulse Rate (!) 114  ECG Heart Rate (!) 113  Resp 20  SpO2 98 %  Assess: MEWS Score  MEWS Temp 0  MEWS Systolic 0  MEWS Pulse 2  MEWS RR 0  MEWS LOC 0  MEWS Score 2  MEWS Score Color Yellow  Assess: if the MEWS score is Yellow or Red  Were vital signs taken at a resting state? Yes  Focused Assessment No change from prior assessment  Early Detection of Sepsis Score *See Row Information* Low  MEWS guidelines implemented *See Row Information* Yes  Treat  Pain Scale 0-10  Pain Score 0  Take Vital Signs  Increase Vital Sign Frequency  Yellow: Q 2hr X 2 then Q 4hr X 2, if remains yellow, continue Q 4hrs  Escalate  MEWS: Escalate Yellow: discuss with charge nurse/RN and consider discussing with provider and RRT  Notify: Charge Nurse/RN  Name of Charge Nurse/RN Notified Rodman Pickle  Date Charge Nurse/RN Notified 04/02/20  Time Charge Nurse/RN Notified 1700  Notify: Provider  Provider Name/Title Doda MD  Date Provider Notified 04/02/20  Time Provider Notified [phone]  Notification Type Page  Notification Reason Other (Comment) (Yellow MEWS)   Pt spiked a Yellow MEWS. BP and HR elevated. Pt showing now signs of distress, resting comfortably in chair. Providers ordered metoprolol and Pt has already received one dose, next this evening.   1841:On-call Resident informed. No new orders at this time. Will reassess after next dose of metoprolol.

## 2020-04-02 NOTE — Progress Notes (Signed)
PHARMACY - PHYSICIAN COMMUNICATION CRITICAL VALUE ALERT - BLOOD CULTURE IDENTIFICATION (BCID)  Isabel Hardy is an 69 y.o. female who presented to [facility] on 04/01/2020 with a chief complaint of fatigue, abd pain, vomiting   Assessment:  69 yof with COVID and blood culture with 1/4 bottles showing Staph species - this is likely CoNS and is a contaminant.  Name of physician (or Provider) Contacted: Dr Karsten Ro  Current antibiotics: none  Changes to prescribed antibiotics recommended:  recommend no antibiotics at this time  Results for orders placed or performed during the hospital encounter of 04/01/20  Blood Culture ID Panel (Reflexed) (Collected: 04/01/2020 10:48 AM)  Result Value Ref Range   Enterococcus faecalis NOT DETECTED NOT DETECTED   Enterococcus Faecium NOT DETECTED NOT DETECTED   Listeria monocytogenes NOT DETECTED NOT DETECTED   Staphylococcus species DETECTED (A) NOT DETECTED   Staphylococcus aureus (BCID) NOT DETECTED NOT DETECTED   Staphylococcus epidermidis NOT DETECTED NOT DETECTED   Staphylococcus lugdunensis NOT DETECTED NOT DETECTED   Streptococcus species NOT DETECTED NOT DETECTED   Streptococcus agalactiae NOT DETECTED NOT DETECTED   Streptococcus pneumoniae NOT DETECTED NOT DETECTED   Streptococcus pyogenes NOT DETECTED NOT DETECTED   A.calcoaceticus-baumannii NOT DETECTED NOT DETECTED   Bacteroides fragilis NOT DETECTED NOT DETECTED   Enterobacterales NOT DETECTED NOT DETECTED   Enterobacter cloacae complex NOT DETECTED NOT DETECTED   Escherichia coli NOT DETECTED NOT DETECTED   Klebsiella aerogenes NOT DETECTED NOT DETECTED   Klebsiella oxytoca NOT DETECTED NOT DETECTED   Klebsiella pneumoniae NOT DETECTED NOT DETECTED   Proteus species NOT DETECTED NOT DETECTED   Salmonella species NOT DETECTED NOT DETECTED   Serratia marcescens NOT DETECTED NOT DETECTED   Haemophilus influenzae NOT DETECTED NOT DETECTED   Neisseria meningitidis NOT  DETECTED NOT DETECTED   Pseudomonas aeruginosa NOT DETECTED NOT DETECTED   Stenotrophomonas maltophilia NOT DETECTED NOT DETECTED   Candida albicans NOT DETECTED NOT DETECTED   Candida auris NOT DETECTED NOT DETECTED   Candida glabrata NOT DETECTED NOT DETECTED   Candida krusei NOT DETECTED NOT DETECTED   Candida parapsilosis NOT DETECTED NOT DETECTED   Candida tropicalis NOT DETECTED NOT DETECTED   Cryptococcus neoformans/gattii NOT DETECTED NOT DETECTED    Loura Back, PharmD, BCPS 8:39 AM

## 2020-04-02 NOTE — Progress Notes (Signed)
Patient ID: Isabel Hardy, female   DOB: 15-Jan-1951, 69 y.o.   MRN: [id number] [facility] Progress Note   Assessment/ Plan:   1. Acute kidney Injury: Based on the available history/timeline of events, appears to be likely ischemic ATN from prolonged prerenal injury/hypotension.  Overnight, urine output charted is not impressive however, she has had improvement of BUN/creatinine.  No indications for acute dialysis at this time. 2.  Hyperkalemia: Secondary to acute kidney injury, improving with intravenous fluids. 3.  Anion gap metabolic acidosis: Secondary to acute kidney injury/lactic acidosis and improving with intravascular volume expansion. 4.  COVID-19 infection/pneumonia: On treatment for superimposed bacterial pneumonia with ceftriaxone and azithromycin.  Currently holding dexamethasone and remdesivir given decent oxygen saturation.  Subjective:   Reports to be feeling poorly-nonspecific without focal chest pain or shortness of breath.  Denies nausea or vomiting.   Objective:   BP 138/87 (BP Location: Left Arm)   Pulse (!) 109   Temp 98.4 F (36.9 C) (Oral)   Resp 16   Ht 5' (1.524 m)   Wt 75.6 kg   SpO2 97%   BMI 32.55 kg/m   Intake/Output Summary (Last 24 hours) at 04/02/2020 1421 Last data filed at 04/02/2020 1341 Gross per 24 hour  Intake 1354 ml  Output --  Net 1354 ml   Weight change:   Physical Exam: Gen: Appears ill but without acute distress. CVS: Irregularly irregular tachycardia, S1 and S2 normal. Resp: Clear to auscultation bilaterally without distinct rales or rhonchi. Abd: Soft, nontender, bowel sounds normal Ext: No lower extremity edema  Imaging: CT Abdomen Pelvis Wo Contrast  Result Date: 04/01/2020 CLINICAL DATA:  Kidney failure EXAM: CT ABDOMEN AND PELVIS WITHOUT CONTRAST TECHNIQUE: Multidetector CT imaging of the abdomen and pelvis was performed following the standard protocol without IV contrast. COMPARISON:  CT abdomen pelvis  07/30/2016 FINDINGS: Lower chest: Motion limited. Multifocal opacities in the bilateral lung bases. Enlarged heart size. Evaluation of the abdominal viscera limited by the lack of IV contrast. Hepatobiliary: No focal liver abnormality is seen. Status post cholecystectomy. \ Pancreas: Fatty replaced.  No surrounding inflammatory change. Spleen: Normal in size without focal abnormality. Adrenals/Urinary Tract: Adrenal glands are unremarkable. No hydronephrosis. No renal calculi. Atrophic right kidney. No large renal mass. Urinary bladder is unremarkable. Stomach/Bowel: Stomach is within normal limits. No evidence of bowel wall thickening, distention, or inflammatory changes. Vascular/Lymphatic: Aortic atherosclerosis. Vascular patency cannot be assessed in the absence of IV contrast. No enlarged abdominal or pelvic lymph nodes. Reproductive: Status post hysterectomy. No adnexal masses. Other: No abdominal wall hernia. 3.7 cm soft tissue density in the lateral right anterior abdominal wall is unchanged. Musculoskeletal: Interval increase in size of several sclerotic foci in the inferior left pubic ramus, right acetabulum and right sacrum, consistent with benign enostoses. Other scattered sclerotic foci are not significantly changed. Degenerative changes throughout the thoracolumbar spine. IMPRESSION: 1. Multifocal opacities in the visualized bilateral lung bases, concerning for bilateral pneumonia. 2. Atrophic right kidney. No renal calculi or hydronephrosis. 3. Aortic atherosclerosis. Aortic Atherosclerosis (ICD10-I70.0). Electronically Signed   By: Emmaline Kluver M.D.   On: 04/01/2020 13:47   US RENAL  Result Date: 04/01/2020 CLINICAL DATA:  AKI EXAM: RENAL / URINARY TRACT ULTRASOUND COMPLETE COMPARISON:  04/29/2017 and prior. FINDINGS: Right Kidney: Renal measurements: 7.5 x 3.1 x 4.1 cm = volume: 50.1 mL. Increased parenchymal echogenicity. No mass or hydronephrosis visualized. Left Kidney: Renal  measurements: 12.2 x 5.0 x 5.6 cm = volume: 180.3 mL.  Echogenicity within normal limits. No mass or hydronephrosis visualized. Bladder: Appears normal for degree of bladder distention. Other: None. IMPRESSION: Echogenic, atrophic right kidney may reflect sequela of medical renal disease. Normal sonographic appearance of the left kidney. Electronically Signed   By: Stana Bunting M.D.   On: 04/01/2020 13:33   DG Chest Portable 1 View  Result Date: 04/01/2020 CLINICAL DATA:  Weakness, COVID-19 positive. EXAM: PORTABLE CHEST 1 VIEW COMPARISON:  May 25, 2004. FINDINGS: Stable cardiomegaly. 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 No pneumothorax or pleural effusion is noted. Left lung is clear. Right infrahilar opacity is noted concerning for possible pneumonia. Bony thorax is unremarkable. IMPRESSION: Right infrahilar opacity is noted concerning for possible pneumonia. Followup PA and lateral chest X-ray is recommended in 3-4 weeks following trial of antibiotic therapy to ensure resolution and exclude underlying malignancy. Electronically Signed   By: Lupita Raider M.D.   On: 04/01/2020 11:37    Labs: BMET Recent Labs  Lab 04/01/20 1046 04/01/20 1358 04/02/20 0805  NA 140 140 147*  K 5.3* 5.2* 4.3  CL 112*  --  121*  CO2 8*  --  12*  GLUCOSE 180*  --  112*  BUN 152*  --  123*  CREATININE 9.83*  --  5.97*  CALCIUM 9.0  --  8.8*   CBC Recent Labs  Lab 04/01/20 1046 04/01/20 1358 04/02/20 0805  WBC 8.8  --  5.5  NEUTROABS 6.8  --  4.0  HGB 14.3 11.9* 12.6  HCT 41.8 35.0* 36.1  MCV 90.5  --  88.0  PLT 222  --  191    Medications:    . apixaban  5 mg Oral BID  . atorvastatin  40 mg Oral QPM  . metoprolol tartrate  12.5 mg Oral BID   Zetta Bills, MD 04/02/2020, 2:21 PM

## 2020-04-02 NOTE — Progress Notes (Signed)
Family Medicine Teaching Service Daily Progress Note Intern Pager: [phone]  Patient name: Isabel Hardy Medical record number: [id number] Date of birth: October 24, 1950 Age: 69 y.o. Gender: female  Primary Care Provider: Buckner Malta, MD Consultants: Nephrology Code Status: Do not want Chest compression or shock, OK to intubate  Pt Overview and Major Events to Date:  Admission on 04/01/2020 Assessment and Plan: Isabel Hardy is a 69 y.o. female presenting with Hypotention and fatigue. Dx of COVID on 03/28/20. PMH is significant for Afib on Eliquis, HTN, HLD, Sinus node dysfunction, Lumber Spinal stenosis.   Hypotension Nausea/Vomitting/diarrhoea Patient denies any vomiting or nausea today. pt admitted with  hypotension and fatigue. Covid positive on 03/28/2020 s/p monoclonal antibody infusion on 03/28/2020. Inflammatory markers were increased on admission. Pt was given bolus of IV fluids. BP's since admission have been systolic between 24-580 and Diastolic between 99-833 mm hg. Most recent BP- 148/91 mmHg, pulse rate ranged from 52-160/minute since admission.  Patient has been afebrile overnight.  She is satting at 98% at room air.  Urinalysis showed cloudy urine with WBCs of 21-50 and many bacteria.  Blood culture shows staph species in 1 bottle possibly contaminated.  We will continue to follow blood culture and urine culture. morning labs sodium 147, potassium 4.3, glucose 112, BUN 123, creatinine 5.97,anion gap is normal at 14. -Monitor vitals per floor. -Follow-up urine culture and blood culture -CBC and CMP daily -Acetaminophen 650 mg as needed for pain headache or fever.  -Discontinue Ringer lactate -Start Dextrose 5% -0.45% normal saline at 100 mL/hr. -Continue cardiac monitoring  -OT and PT eval and treat.  COVID Positive Pneumonia Concern for super-imposed bacterial pneumonia She denies any shortness of breath, chest pain.  Chest clear to auscultation. patient needed  oxygen at night but now satting at 98% on room air. COVID positive on 03/28/2020. S/P MAB infusion this past week. vaccinated against COVID.  Chest X ray- Right infrahilar opacity concerning for possible pneumonia. CT abdomen/pelvis confirmed multifocal bilateral pneumonia. Patient was given 1 dose of ceftriaxone and azithromycin for pneumonia. COVID labs elevated including LDH, troponin, D-dimer, fibrinogen, CRP.  Procalcitonin and LA WNL. - s/p MAB -We will hold dexamethasone and remdesivir but will reconsider if she needs O2. -Contact and air bone precautions. -vitals per floor. -Maintain oxygen saturation more than 92% -Continue incentive spirometry. - repeat CXR 3-4 weeks after Abx to ensure right hilar opacity has resolved to r/o underlying malignancy  Acute Renal Failure No history of CKD.  Patient denies any blood in urine today.This morning creatinine is 5.97 and BUN of 123 improved from yesterday creatinine 9.83 with BUN of 152 and anion gap of 20. Patient creatinine on 01/17/2019 was 1.66. Renal ultrasound showed  atrophic right kidney and normal left kidney. CT abdomen/ pelvis-atrophic right kidney.  Nephrology consulted. Appreciate recommendations . Recommended supportive treatment for now and will reassess today for need of dialysis.  Per nephro, no need for bicarb at present unless continues to decrease, and then would recommend VBG to determine.Urinalysis showed cloudy urine with WBCs of 21-50 and many bacteria. We'll F/u Urine cx. -Nephro consulted. Appreciate recommendations.  -Daily CMP. -Monitor I and O.  -Daily weight. -F/U Urine Cx  -Hold ARB/ HCTZ in setting of ARF and hypotension  - Stop RL -Start D5%-0.45% NaCl infusion at 100 ml/hr - renal diet pending improvement -Maintain MAP > 65 -Avoid nephrotoxins and contrast.  Altered Mental Status (improved ) Oriented x4.  Talking appropriately. AMS noted in ED by  ED provider, thought to be 2/2 uremia with BUN 152 or  hypotension.  Improved after fluid administration.    Today, A&Ox4 on exam, neurologically intact.   - cont fluids per above - ARF treatment per above -Continue to hold sedating agents and BP meds - has xanax listed on home meds, but not on PMP, therefore low risk for withdrawal  Hyperkalemia (Resolved) Today, K 4.3.  - monitor K on BMP - cardiac telemetry  Elevated troponins CAD Today, patient denies chest pain, and shortness of breath. At admission, Pt's troponins mildly elevated at 47.  Repeat troponin was down trended to 46.  Likely elevated in the setting of hypotension, acute COVID and renal failure. -Monitor vitals.  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 Pt asymptomatic. Pulse rate ranged from 52 to 160/min since admission, patient noted to be in A. Fib yesterday and Aflutter today on monitor. Patient has a dual-chamber pacemaker (03/17/2018) for sinus bradycardia and pauses.  History of mitral valve repair with annuloplasty in 2017, developed A fib afterwards. S/P DCCV 01/24/20.  Patient last saw her cardiologist on 02/08/2020 and was noted to be in A fib.  Her device was last checked in 01/19/2020. home medication includes metoprolol BID and Multaq 400 mg twice daily per cardiology notes, but Multaq not listed in med rec. Pt takes Eliquis 5 mg BID. At last visit, her cardiologist discussed ablation if it reccurs again.  -EKG as needed. -Continue cardiac monitoring -Continue Eliquis 5 mg twice daily -Start metoprolol  12.5 mg BID. SBP is stable now -attempt to determine is patient is still on multaq, holding until then -Cardiology consult if heart rate continue to be high after metoprolol.  HTN Patient BPs overnight systolic ranged from 77 to 152 and diastolic ranged from 48 to 128 mmHg. Initially hypotensive on admission.  Home meds include HCTZ 12.5 daily,  Telmisartan-hydrochlorothiazide 40-12.5 mg daily. Lasix was stopped recently on 02/08/2020 and changed to as needed only.  -Hold home HTN medications in setting of hypotension  -Will start Metoprolol for HR control.  HLD Home meds include Lipitor.  Triglycerides were 228 at admission.  last lipid panel was normal on 12/30/2016.  -Start Lipitor 40 mg.  Lumber Spinal stenosis.   S/P b/l L4-5 laminectomy and b/l L3-4 laminotomies/foraminotomies 12/2018.  Home meds include cyclobenzaprine -Hold home cyclobenzaprine in setting of decreased mentation on arrival. - would consider change to baclofen given age if restart  FEN/GI: Renal diet fluid restriction less than 1200 mL Prophylaxis: Eliquis 5 mg BID   Disposition: Progressive Pt is not stable for discharge.   Subjective:  Patient denies any vomiting or nausea today.  Patient states she feels sick but denies any complaints.  Patient denies chest pain, shortness of breath, dizziness, abdominal pain, diarrhea.  A. fib noted on monitor her rates are high between 110's- 130's Objective: Temp:  [98.2 F (36.8 C)-99.2 F (37.3 C)] 98.3 F (36.8 C) (12/13 0453) Pulse Rate:  [52-160] 101 (12/13 0453) Resp:  [15-28] 20 (12/13 0453) BP: (77-152)/(48-128) 148/91 (12/13 0453) SpO2:  [90 %-100 %] 98 % (12/13 0453) Weight:  [72.6 kg-75.6 kg] 75.6 kg (12/13 0453) Physical Exam: General: Not in acute distress, she is ill appearing. Not toxic appearing. Cardiovascular: Irregularly irregular rhythm Respiratory: Clear to auscultation bilaterally, no increased WOB on RA Abdomen: Soft, nontender, nondistended Extremities: 1+ dorsalis pedis pulses b/l.  No extremity  edema  Laboratory: Recent Labs  Lab 04/01/20 1046 04/01/20 1358  WBC 8.8  --   HGB 14.3 11.9*  HCT 41.8 35.0*  PLT 222  --    Recent Labs  Lab 04/01/20 1046 04/01/20 1358  NA 140 140  K 5.3* 5.2*  CL 112*  --   CO2 8*  --   BUN 152*  --   CREATININE 9.83*  --   CALCIUM  9.0  --   PROT 6.2*  --   BILITOT 1.3*  --   ALKPHOS 66  --   ALT 22  --   AST 30  --   GLUCOSE 180*  --      Imaging/Diagnostic Tests: CT Abdomen Pelvis Wo Contrast  Result Date: 04/01/2020 CLINICAL DATA:  Kidney failure EXAM: CT ABDOMEN AND PELVIS WITHOUT CONTRAST TECHNIQUE: Multidetector CT imaging of the abdomen and pelvis was performed following the standard protocol without IV contrast. COMPARISON:  CT abdomen pelvis 07/30/2016 FINDINGS: Lower chest: Motion limited. Multifocal opacities in the bilateral lung bases. Enlarged heart size. Evaluation of the abdominal viscera limited by the lack of IV contrast. Hepatobiliary: No focal liver abnormality is seen. Status post cholecystectomy. \ Pancreas: Fatty replaced.  No surrounding inflammatory change. Spleen: Normal in size without focal abnormality. Adrenals/Urinary Tract: Adrenal glands are unremarkable. No hydronephrosis. No renal calculi. Atrophic right kidney. No large renal mass. Urinary bladder is unremarkable. Stomach/Bowel: Stomach is within normal limits. No evidence of bowel wall thickening, distention, or inflammatory changes. Vascular/Lymphatic: Aortic atherosclerosis. Vascular patency cannot be assessed in the absence of IV contrast. No enlarged abdominal or pelvic lymph nodes. Reproductive: Status post hysterectomy. No adnexal masses. Other: No abdominal wall hernia. 3.7 cm soft tissue density in the lateral right anterior abdominal wall is unchanged. Musculoskeletal: Interval increase in size of several sclerotic foci in the inferior left pubic ramus, right acetabulum and right sacrum, consistent with benign enostoses. Other scattered sclerotic foci are not significantly changed. Degenerative changes throughout the thoracolumbar spine. IMPRESSION: 1. Multifocal opacities in the visualized bilateral lung bases, concerning for bilateral pneumonia. 2. Atrophic right kidney. No renal calculi or hydronephrosis. 3. Aortic atherosclerosis.  Aortic Atherosclerosis (ICD10-I70.0). Electronically Signed   By: Emmaline Kluver M.D.   On: 04/01/2020 13:47   US RENAL  Result Date: 04/01/2020 CLINICAL DATA:  AKI EXAM: RENAL / URINARY TRACT ULTRASOUND COMPLETE COMPARISON:  04/29/2017 and prior. FINDINGS: Right Kidney: Renal measurements: 7.5 x 3.1 x 4.1 cm = volume: 50.1 mL. Increased parenchymal echogenicity. No mass or hydronephrosis visualized. Left Kidney: Renal measurements: 12.2 x 5.0 x 5.6 cm = volume: 180.3 mL. Echogenicity within normal limits. No mass or hydronephrosis visualized. Bladder: Appears normal for degree of bladder distention. Other: None. IMPRESSION: Echogenic, atrophic right kidney may reflect sequela of medical renal disease. Normal sonographic appearance of the left kidney. Electronically Signed   By: Stana Bunting M.D.   On: 04/01/2020 13:33   DG Chest Portable 1 View  Result Date: 04/01/2020 CLINICAL DATA:  Weakness, COVID-19 positive. EXAM: PORTABLE CHEST 1 VIEW COMPARISON:  May 25, 2004. FINDINGS: Stable cardiomegaly. 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 No pneumothorax or pleural effusion is noted. Left lung is clear. Right infrahilar opacity is noted concerning for possible pneumonia. Bony thorax is unremarkable. IMPRESSION: Right infrahilar opacity is noted concerning for possible pneumonia. Followup PA and lateral chest X-ray is recommended in 3-4 weeks following trial of antibiotic therapy to ensure resolution and  exclude underlying malignancy. Electronically Signed   By: Lupita RaiderJames  Green Jr M.D.   On: 04/01/2020 11:37    Karsten Rooda, Seeley Southgate, MD 04/02/2020, 6:27 AM PGY-1, [facility] FPTS Intern pager: [phone], text pages welcome

## 2020-04-03 LAB — COMPREHENSIVE METABOLIC PANEL
ALT: 19 U/L (ref 0–44)
AST: 26 U/L (ref 15–41)
Albumin: 2.4 g/dL — ABNORMAL LOW (ref 3.5–5.0)
Alkaline Phosphatase: 57 U/L (ref 38–126)
Anion gap: 10 (ref 5–15)
BUN: 89 mg/dL — ABNORMAL HIGH (ref 8–23)
CO2: 15 mmol/L — ABNORMAL LOW (ref 22–32)
Calcium: 8.7 mg/dL — ABNORMAL LOW (ref 8.9–10.3)
Chloride: 119 mmol/L — ABNORMAL HIGH (ref 98–111)
Creatinine, Ser: 3.17 mg/dL — ABNORMAL HIGH (ref 0.44–1.00)
GFR, Estimated: 15 mL/min — ABNORMAL LOW (ref >60)
Glucose, Bld: 131 mg/dL — ABNORMAL HIGH (ref 70–99)
Potassium: 3.6 mmol/L (ref 3.5–5.1)
Sodium: 144 mmol/L (ref 135–145)
Total Bilirubin: 1 mg/dL (ref 0.3–1.2)
Total Protein: 4.7 g/dL — ABNORMAL LOW (ref 6.5–8.1)

## 2020-04-03 LAB — CBC WITH DIFFERENTIAL/PLATELET
Abs Immature Granulocytes: 0.11 10*3/uL — ABNORMAL HIGH (ref 0.00–0.07)
Basophils Absolute: 0 10*3/uL (ref 0.0–0.1)
Basophils Relative: 1 %
Eosinophils Absolute: 0.2 10*3/uL (ref 0.0–0.5)
Eosinophils Relative: 5 %
HCT: 30.7 % — ABNORMAL LOW (ref 36.0–46.0)
Hemoglobin: 11.1 g/dL — ABNORMAL LOW (ref 12.0–15.0)
Immature Granulocytes: 2 %
Lymphocytes Relative: 10 %
Lymphs Abs: 0.5 10*3/uL — ABNORMAL LOW (ref 0.7–4.0)
MCH: 31.5 pg (ref 26.0–34.0)
MCHC: 36.2 g/dL — ABNORMAL HIGH (ref 30.0–36.0)
MCV: 87.2 fL (ref 80.0–100.0)
Monocytes Absolute: 0.4 10*3/uL (ref 0.1–1.0)
Monocytes Relative: 8 %
Neutro Abs: 3.7 10*3/uL (ref 1.7–7.7)
Neutrophils Relative %: 74 %
Platelets: 168 10*3/uL (ref 150–400)
RBC: 3.52 MIL/uL — ABNORMAL LOW (ref 3.87–5.11)
RDW: 13.3 % (ref 11.5–15.5)
WBC: 4.9 10*3/uL (ref 4.0–10.5)
nRBC: 0 % (ref 0.0–0.2)

## 2020-04-03 LAB — CULTURE, BLOOD (ROUTINE X 2): Special Requests: ADEQUATE

## 2020-04-03 MED ORDER — SODIUM BICARBONATE 650 MG PO TABS
650.0000 mg | ORAL_TABLET | Freq: Two times a day (BID) | ORAL | Status: DC
  Administered 2020-04-03 – 2020-04-05 (×5): 650 mg via ORAL
  Filled 2020-04-03 (×5): qty 1

## 2020-04-03 MED ORDER — SODIUM BICARBONATE 8.4 % IV SOLN
INTRAVENOUS | Status: AC
  Filled 2020-04-03: qty 850

## 2020-04-03 MED ORDER — DRONEDARONE HCL 400 MG PO TABS
400.0000 mg | ORAL_TABLET | Freq: Two times a day (BID) | ORAL | Status: DC
  Administered 2020-04-03 – 2020-04-05 (×4): 400 mg via ORAL
  Filled 2020-04-03 (×6): qty 1

## 2020-04-03 MED ORDER — METOPROLOL TARTRATE 25 MG PO TABS
25.0000 mg | ORAL_TABLET | Freq: Two times a day (BID) | ORAL | Status: DC
  Administered 2020-04-03 – 2020-04-05 (×4): 25 mg via ORAL
  Filled 2020-04-03 (×4): qty 1

## 2020-04-03 MED ORDER — METOPROLOL TARTRATE 12.5 MG HALF TABLET
12.5000 mg | ORAL_TABLET | Freq: Once | ORAL | Status: AC
  Administered 2020-04-03: 12:00:00 12.5 mg via ORAL
  Filled 2020-04-03: qty 1

## 2020-04-03 NOTE — Progress Notes (Addendum)
Called and spoke with patient's husband.  He is not currently in the house, but will go there in the next few minutes to verify medications.  Will call back.  He also notes that patient is usually not slow to respond.  Her baseline is that she answers at a usual speed.  Will call back to discuss the medications.  Called back and confirmed that patient is taking Multaq and telmisartan, both are in her pill box.  Will restart multaq, but continuing to hold telmisartan in setting of renal function.  Luis Abed, D.O.  PGY-3 Family Medicine  04/03/2020 11:41 AM

## 2020-04-03 NOTE — Progress Notes (Signed)
Family Medicine Teaching Service Daily Progress Note Intern Pager: [phone]  Patient name: Isabel Hardy Medical record number: [id number] Date of birth: [birth year] Age: 69 y.o. Gender: female  Primary Care Provider: Buckner Malta, MD Consultants: Nephrology Code Status: Do not want Chest compressionor shock, OK to intubate  Pt Overview and Major Events to Date:  Admission on 04/01/2020 Assessment and Plan: Isabel Hardy a 69 y.o.femalepresenting with Hypotentionand fatigue.Dx of COVID on 03/28/20.PMH is significant forAfib on Eliquis, HTN, HLD, Sinus node dysfunction, Lumber Spinal stenosis.   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 A flutter noted on monitor. Pulse rate ranged from 96 to 133/min since admission,Patient has a dual-chamber pacemaker(03/17/2018)for sinus bradycardia and pauses.History of mitral valve repair with annuloplasty in 2017, developed A fib afterwards.S/P DCCV 01/24/20.Patientlastsaw her cardiologist on 10/20/2021and was noted to be in A fib.Her devicewaslastchecked in 01/19/2020.home medication includesmetoprolol BID and Multaq 400 mg twice daily per cardiology notes, Pt takes Eliquis 5 mg BID.At last visit, her cardiologist discussed ablation if it reccursagain. Dr. Obie Dredge confirmed with Husband, Pt doesn't take Multaq.  -EKG as needed. -Continue cardiac monitoring -Continue Eliquis5 mgtwice daily -Increase metoprololto 25 mg BID. SBP is stable now -Start Multaq 400 mg BID.   Acute Renal Failure No history of CKD.This morning her labs improved -creatinine is 3.17, BUN 89, Na-144, K-3.6  Renalultrasoundshowedatrophic right kidneyand normal left kidney.CT abdomen/pelvis-atrophic right kidney.Nephrologyconsulted.Appreciate recommendations.Recommended supportive treatment for now, no dialysis  needed.We'll F/u Urine cx. -Nephro consulted.Appreciate recommendations.  -Daily CMP. -Monitor I and O.  -Daily weight. -F/U Urine Cx  -Hold ARB/HCTZin setting of ARF and hypotension  -Continue D5%-0.45% NaCl infusion at 100 ml/hr  - renal diet pending improvement -Maintain MAP>65 -Avoid nephrotoxinsand contrast.  COVIDPositivePneumonia Concern for super-imposed bacterial pneumonia Today, She denies any shortness of breath, chest pain. Afebrile. patientsatting at 98% on room air. COVID positive on 03/28/2020. S/P MAB infusion this past week. vaccinated against COVID.Chest X ray- Right infrahilar opacity concerning for possible pneumonia.CT abdomen/pelvis confirmed multifocal bilateral pneumonia.Patient wasgiven 1 dose of ceftriaxone and azithromycinfor pneumonia. - s/p MAB -We will hold dexamethasone and remdesivir but will reconsider if she needs O2. -Contact and air bone precautions. -vitals per floor. -Maintain oxygen saturation more than 92% -Continue incentive spirometry. - repeat CXR 3-4 weeks after Abx to ensure right hilar opacity has resolved to r/o underlying malignancy  Altered Mental Status (improved ) Oriented x4. Talking appropriately but slow to respond. - cont fluids per above - ARF treatment per above -Continue to hold sedating agents and BP meds   Hypotension Nausea/Vomitting/diarrhoea (Resolved) Patient denies nausea, vomiting, diarrhea pt admitted with  hypotension and fatigue. Covid positive on 03/28/2020 s/p monoclonal antibody infusion on 03/28/2020. BP's since admission have been systolic between 536-468 and Diastolic between 03-212  mm hg. Most recent BP- 127/87 mmHg, pulse rate ranged from 96-133/minute since admission.  Patient has been afebrile overnight.  She is satting at 97% at room air. Blood culture shows staph species in 1 bottle possibly contaminated.  We will continue to follow blood culture and urine culture. morning labs sodium 144,  potassium 3.6 glucose 131  , BUN 89, creatinine 3.17. PT recommended Home health PT. -Monitor vitals per floor. -Follow-up urine culture and blood culture -CMP daily -Acetaminophen 650 mg as needed for pain headache or fever.  -Continue Dextrose 5% -0.45% normal saline at 100 mL/hr. -Continue cardiac monitoring  HTN Patient BPs overnight  systolic ranged from 127-157 and diastolic ranged from 70-103 mmHg. Home meds include HCTZ 12.5 daily,Telmisartan-hydrochlorothiazide 40-12.5 mg daily. -Hold home HTN medicationsin setting of AKI -Continue  Metoprolol for HR control.  HLD Home meds include Lipitor. -Continue Lipitor 40 mg.  Lumber Spinal stenosis. S/P b/l L4-5 laminectomy and b/l L3-4 laminotomies/foraminotomies 12/2018.Home meds include cyclobenzaprine -Hold home cyclobenzaprinein setting of decreased mentation on arrival.  FEN/GI:Renal diet fluid restriction less than 1200 mL Prophylaxis:Eliquis5 mg BID   Disposition:Progressive Pt is not stable for discharge.   Subjective:  Pt denies any chest pain, shortness of breath, dizziness.  She denies any complaints.   Objective: Temp:  [98.3 F (36.8 C)-98.7 F (37.1 C)] 98.7 F (37.1 C) (12/14 0737) Pulse Rate:  [96-133] 98 (12/14 0737) Resp:  [13-26] 18 (12/14 0737) BP: (127-157)/(73-103) 127/87 (12/14 0737) SpO2:  [95 %-98 %] 97 % (12/14 0737) Weight:  [75.6 kg] 75.6 kg (12/14 0420) Physical Exam: General: Not in acute distress, she is not in acute distress.. Not toxic appearing. Cardiovascular: irregularly irregular rhythm Respiratory: Clear to auscultation bilaterally, no increased WOB on RA  Extremities: 1+ dorsalis pedis pulses b/l.No extremity edema  Laboratory: Recent Labs  Lab 04/01/20 1046 04/01/20 1358 04/02/20 0805 04/03/20 0608  WBC 8.8  --  5.5 4.9  HGB 14.3 11.9* 12.6 11.1*  HCT 41.8 35.0* 36.1 30.7*  PLT 222  --  191 168   Recent Labs  Lab 04/01/20 1046 04/01/20 1358  04/02/20 0805  NA 140 140 147*  K 5.3* 5.2* 4.3  CL 112*  --  121*  CO2 8*  --  12*  BUN 152*  --  123*  CREATININE 9.83*  --  5.97*  CALCIUM 9.0  --  8.8*  PROT 6.2*  --  5.5*  BILITOT 1.3*  --  1.2  ALKPHOS 66  --  62  ALT 22  --  20  AST 30  --  27  GLUCOSE 180*  --  112*      Imaging/Diagnostic Tests: CT Abdomen Pelvis Wo Contrast  Result Date: 04/01/2020 CLINICAL DATA:  Kidney failure EXAM: CT ABDOMEN AND PELVIS WITHOUT CONTRAST TECHNIQUE: Multidetector CT imaging of the abdomen and pelvis was performed following the standard protocol without IV contrast. COMPARISON:  CT abdomen pelvis 07/30/2016 FINDINGS: Lower chest: Motion limited. Multifocal opacities in the bilateral lung bases. Enlarged heart size. Evaluation of the abdominal viscera limited by the lack of IV contrast. Hepatobiliary: No focal liver abnormality is seen. Status post cholecystectomy. \ Pancreas: Fatty replaced.  No surrounding inflammatory change. Spleen: Normal in size without focal abnormality. Adrenals/Urinary Tract: Adrenal glands are unremarkable. No hydronephrosis. No renal calculi. Atrophic right kidney. No large renal mass. Urinary bladder is unremarkable. Stomach/Bowel: Stomach is within normal limits. No evidence of bowel wall thickening, distention, or inflammatory changes. Vascular/Lymphatic: Aortic atherosclerosis. Vascular patency cannot be assessed in the absence of IV contrast. No enlarged abdominal or pelvic lymph nodes. Reproductive: Status post hysterectomy. No adnexal masses. Other: No abdominal wall hernia. 3.7 cm soft tissue density in the lateral right anterior abdominal wall is unchanged. Musculoskeletal: Interval increase in size of several sclerotic foci in the inferior left pubic ramus, right acetabulum and right sacrum, consistent with benign enostoses. Other scattered sclerotic foci are not significantly changed. Degenerative changes throughout the thoracolumbar spine. IMPRESSION: 1.  Multifocal opacities in the visualized bilateral lung bases, concerning for bilateral pneumonia. 2. Atrophic right kidney. No renal calculi or hydronephrosis. 3. Aortic atherosclerosis. Aortic Atherosclerosis (ICD10-I70.0). Electronically Signed  By: Emmaline Kluver M.D.   On: 04/01/2020 13:47   US RENAL  Result Date: 04/01/2020 CLINICAL DATA:  AKI EXAM: RENAL / URINARY TRACT ULTRASOUND COMPLETE COMPARISON:  04/29/2017 and prior. FINDINGS: Right Kidney: Renal measurements: 7.5 x 3.1 x 4.1 cm = volume: 50.1 mL. Increased parenchymal echogenicity. No mass or hydronephrosis visualized. Left Kidney: Renal measurements: 12.2 x 5.0 x 5.6 cm = volume: 180.3 mL. Echogenicity within normal limits. No mass or hydronephrosis visualized. Bladder: Appears normal for degree of bladder distention. Other: None. IMPRESSION: Echogenic, atrophic right kidney may reflect sequela of medical renal disease. Normal sonographic appearance of the left kidney. Electronically Signed   By: Stana Bunting M.D.   On: 04/01/2020 13:33   DG Chest Portable 1 View  Result Date: 04/01/2020 CLINICAL DATA:  Weakness, COVID-19 positive. EXAM: PORTABLE CHEST 1 VIEW COMPARISON:  May 25, 2004. FINDINGS: Stable cardiomegaly. 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 No pneumothorax or pleural effusion is noted. Left lung is clear. Right infrahilar opacity is noted concerning for possible pneumonia. Bony thorax is unremarkable. IMPRESSION: Right infrahilar opacity is noted concerning for possible pneumonia. Followup PA and lateral chest X-ray is recommended in 3-4 weeks following trial of antibiotic therapy to ensure resolution and exclude underlying malignancy. Electronically Signed   By: Lupita Raider M.D.   On: 04/01/2020 11:37   Karsten Ro, MD 04/03/2020, 7:53 AM PGY-1, [facility] FPTS Intern pager: [phone], text pages welcome

## 2020-04-03 NOTE — Progress Notes (Signed)
Patient ID: Isabel Hardy, female   DOB: 05/16/1950, 69 y.o.   MRN: [id number] [facility] Progress Note   Assessment/ Plan:   1. Acute kidney Injury: Based on the available history/timeline of events, appears to be likely ischemic ATN from prolonged prerenal injury/hypotension.  No urine output charted however, both BUN and creatinine showing signs of renal recovery and without any acute electrolyte abnormality or volume concerns to prompt intervention. 2.  Hyperkalemia: Secondary to acute kidney injury, corrected with intravenous fluids and renal recovery. 3.  Anion gap metabolic acidosis: Secondary to acute kidney injury/lactic acidosis and continues to improve with renal recovery/status post volume expansion.  I will begin her on oral sodium bicarbonate. 4.  COVID-19 infection/pneumonia: On treatment for superimposed bacterial pneumonia with ceftriaxone and azithromycin.  Currently holding dexamethasone and remdesivir given decent oxygen saturation.  Subjective:   Reports that she is feeling a little better and denies any chest pain or shortness of breath.  Asks for assistance to get into bed so that she can rest.   Objective:   BP (!) 129/103 (BP Location: Right Arm)   Pulse (!) 104   Temp 97.8 F (36.6 C) (Axillary)   Resp 18   Ht 5' (1.524 m)   Wt 75.6 kg   SpO2 97%   BMI 32.55 kg/m   Intake/Output Summary (Last 24 hours) at 04/03/2020 1403 Last data filed at 04/03/2020 1300 Gross per 24 hour  Intake 1849.83 ml  Output --  Net 1849.83 ml   Weight change: 3.024 kg  Physical Exam: Gen: Appears comfortable sitting up in recliner CVS: Irregularly irregular-borderline tachycardia, S1 and S2 normal. Resp: Clear to auscultation bilaterally without distinct rales or rhonchi. Abd: Soft, nontender, bowel sounds normal Ext: No lower extremity edema  Imaging: No results found.  Labs: BMET Recent Labs  Lab 04/01/20 1046 04/01/20 1358 04/02/20 0805  04/03/20 0608  NA 140 140 147* 144  K 5.3* 5.2* 4.3 3.6  CL 112*  --  121* 119*  CO2 8*  --  12* 15*  GLUCOSE 180*  --  112* 131*  BUN 152*  --  123* 89*  CREATININE 9.83*  --  5.97* 3.17*  CALCIUM 9.0  --  8.8* 8.7*   CBC Recent Labs  Lab 04/01/20 1046 04/01/20 1358 04/02/20 0805 04/03/20 0608  WBC 8.8  --  5.5 4.9  NEUTROABS 6.8  --  4.0 3.7  HGB 14.3 11.9* 12.6 11.1*  HCT 41.8 35.0* 36.1 30.7*  MCV 90.5  --  88.0 87.2  PLT 222  --  191 168    Medications:    . apixaban  5 mg Oral BID  . atorvastatin  40 mg Oral QPM  . dronedarone  400 mg Oral BID WC  . metoprolol tartrate  25 mg Oral BID   Zetta Bills, MD 04/03/2020, 2:03 PM

## 2020-04-03 NOTE — TOC Initial Note (Signed)
Transition of Care [facility]) - Initial/Assessment Note    Patient Details  Name: Isabel Hardy MRN: [id number] Date of Birth: [birth year]  Transition of Care [facility]) CM/SW Contact:    Lawerance Sabal, RN Phone Number: 04/03/2020, 1:26 PM  Clinical Narrative:                Spoke to patient's spouse, unable to reach patient. He states that they would like [facility] services, discussed ratings and providers, agreeable to [facility] and referral accepted. He states that they have a RW at home but would like a 3/1 for home. Referral made to [employer], and per liaison, they have had a comparable piece of DME to recently for a new one to be covered and they were going to call spouse and offer to provide one of he paid out of pocket.    Expected Discharge Plan: Home w Home Health Services Barriers to Discharge: Continued Medical Work up   Patient Goals and CMS Choice Patient states their goals for this hospitalization and ongoing recovery are:: to return home per spouse CMS Medicare.gov Compare Post Acute Care list provided to:: Other (Comment Required) Choice offered to / list presented to : Spouse  Expected Discharge Plan and Services Expected Discharge Plan: Home w Home Health Services   Discharge Planning Services: CM Consult Post Acute Care Choice: Home Health,Durable Medical Equipment Living arrangements for the past 2 months: Single Family Home                 DME Arranged: 3-N-1 DME Agency: [facility] Date DME Agency Contacted: 04/03/20 Time DME Agency Contacted: 1200 Representative spoke with at DME Agency: Kandee Keen            Prior Living Arrangements/Services Living arrangements for the past 2 months: Single Family Home Lives with:: Spouse              Current home services: DME    Activities of Daily Living Home Assistive Devices/Equipment: None ADL Screening (condition at time of admission) Patient's cognitive ability adequate to safely complete daily activities?:  Yes Is the patient deaf or have difficulty hearing?: No Does the patient have difficulty seeing, even when wearing glasses/contacts?: No Does the patient have difficulty concentrating, remembering, or making decisions?: No Patient able to express need for assistance with ADLs?: Yes Does the patient have difficulty dressing or bathing?: No Independently performs ADLs?: Yes (appropriate for developmental age) Does the patient have difficulty walking or climbing stairs?: No Weakness of Legs: None Weakness of Arms/Hands: None  Permission Sought/Granted                  Emotional Assessment              Admission diagnosis:  AKI (acute kidney injury) (HCC) [N17.9] Acute renal failure, unspecified acute renal failure type (HCC) [N17.9] Hypotension due to hypovolemia [I95.89, E86.1] COVID [U07.1] COVID-19 virus infection [U07.1] Patient Active Problem List   Diagnosis Date Noted  . Pneumonia due to COVID-19 virus 04/02/2020  . Atrophic kidney, Right 04/02/2020  . Acute renal failure (ARF) (HCC) 04/02/2020  . Hypovolemia associated with vomiting 04/02/2020  . Hypovolemic Hypotension 04/02/2020  . Pyuria 04/02/2020  . Hematuria 04/02/2020  . Atherosclerosis of aorta (HCC) 04/02/2020  . Somnolence 04/02/2020  . Presence of permanent cardiac pacemaker   . COVID-19 virus infection 04/01/2020  . Spinal stenosis of lumbar region with neurogenic claudication 01/19/2019  . Hx of Grade III diastolic dysfunction 10/30/2017  . Sinus  node dysfunction (HCC) 09/24/2016  . Paroxysmal A-fib (HCC) 10/03/2015  . Atrial flutter (HCC) 10/03/2015  . S/P MVR (mitral valve repair) 10/03/2015  . Dyslipidemia 11/23/2014  . Essential hypertension 11/23/2014   PCP:  Buckner Malta, MD Pharmacy:   [facility] - [place], [place] - [address] [place] [address] Phone: [phone] Fax: [phone]     Social Determinants of Health (SDOH) Interventions     Readmission Risk Interventions No flowsheet data found.

## 2020-04-03 NOTE — Progress Notes (Signed)
Physical Therapy Treatment Patient Details Name: Isabel Hardy MRN: [id number] DOB: [birth year] Today's Date: 04/03/2020    History of Present Illness Pt is 69 y.o. female presented 04/01/20 with Hypotension and fatigue. Dx of COVID on 03/28/20. acute on chronic kidney disease. PMH is significant for Afib on Eliquis, HTN, HLD, Sinus node dysfunction, Lumber Spinal stenosis.    PT Comments    Pt making good progress today.  She continues to have flat affect and require increased time for response and cues for initiation.  Her VSS on RA.  She did need frequent cues to avoid objects with RW and was able to take a few steps without RW.  Consider advancing to LRAD next visit.     Follow Up Recommendations  Home health PT;Supervision/Assistance - 24 hour     Equipment Recommendations  Rolling walker with 5" wheels    Recommendations for Other Services       Precautions / Restrictions Precautions Precautions: Fall    Mobility  Bed Mobility               General bed mobility comments: in chair at arrival  Transfers Overall transfer level: Needs assistance Equipment used: Rolling walker (2 wheeled) Transfers: Sit to/from Stand Sit to Stand: Min guard         General transfer comment: Min guard for safety; performed x 3 (from chair and BSC x2); Required assist with ADLs due to lines/leads  Ambulation/Gait Ambulation/Gait assistance: Min assist Gait Distance (Feet): 150 Feet Assistive device: Rolling walker (2 wheeled) Gait Pattern/deviations: Step-to pattern;Decreased stride length Gait velocity: decreased   General Gait Details: Frequent cues and assist to avoid objects with RW (both sides) - pt does not typically use RW.  Min guard to steady.   Stairs             Wheelchair Mobility    Modified Rankin (Stroke Patients Only)       Balance Overall balance assessment: Needs assistance   Sitting balance-Leahy Scale: Good     Standing balance  support: No upper extremity supported;Bilateral upper extremity supported Standing balance-Leahy Scale: Fair Standing balance comment: Used RW for hallway ambulation; took a few steps in room without AD                            Cognition Arousal/Alertness: Awake/alert Behavior During Therapy: Flat affect Overall Cognitive Status: Impaired/Different from baseline Area of Impairment: Orientation;Attention;Memory;Safety/judgement;Awareness;Problem solving;Following commands                 Orientation Level: Disoriented to;Time Current Attention Level: Selective Memory: Decreased short-term memory Following Commands: Follows one step commands with increased time;Follows multi-step commands with increased time Safety/Judgement: Decreased awareness of safety;Decreased awareness of deficits Awareness: Emergent Problem Solving: Slow processing;Decreased initiation General Comments: Pt still with slow response but RN reports significant improvement from yesterday      Exercises      General Comments General comments (skin integrity, edema, etc.): Pt on RA with sats >95%      Pertinent Vitals/Pain Pain Assessment: No/denies pain    Home Living                      Prior Function            PT Goals (current goals can now be found in the care plan section) Acute Rehab PT Goals Patient Stated Goal: return home PT Goal Formulation: With patient  Time For Goal Achievement: 04/16/20 Potential to Achieve Goals: Good Progress towards PT goals: Progressing toward goals    Frequency    Min 3X/week      PT Plan Current plan remains appropriate    Co-evaluation              AM-PAC PT "6 Clicks" Mobility   Outcome Measure  Help needed turning from your back to your side while in a flat bed without using bedrails?: A Little Help needed moving from lying on your back to sitting on the side of a flat bed without using bedrails?: A Little Help  needed moving to and from a bed to a chair (including a wheelchair)?: A Little Help needed standing up from a chair using your arms (e.g., wheelchair or bedside chair)?: A Little Help needed to walk in hospital room?: A Little Help needed climbing 3-5 steps with a railing? : A Little 6 Click Score: 18    End of Session Equipment Utilized During Treatment: Gait belt Activity Tolerance: Patient tolerated treatment well Patient left: in chair;with chair alarm set;with call bell/phone within reach Nurse Communication: Mobility status PT Visit Diagnosis: Unsteadiness on feet (R26.81);Muscle weakness (generalized) (M62.81)     Time: 9562-1308 PT Time Calculation (min) (ACUTE ONLY): 19 min  Charges:  $Gait Training: 8-22 mins                     Anise Salvo, PT Acute Rehab Services Pager [phone] [facility] [phone]     Rayetta Humphrey 04/03/2020, 2:46 PM

## 2020-04-04 DIAGNOSIS — N170 Acute kidney failure with tubular necrosis: Secondary | ICD-10-CM

## 2020-04-04 LAB — CBC WITH DIFFERENTIAL/PLATELET
Abs Immature Granulocytes: 0.2 10*3/uL — ABNORMAL HIGH (ref 0.00–0.07)
Basophils Absolute: 0 10*3/uL (ref 0.0–0.1)
Basophils Relative: 1 %
Eosinophils Absolute: 0.2 10*3/uL (ref 0.0–0.5)
Eosinophils Relative: 4 %
HCT: 29.8 % — ABNORMAL LOW (ref 36.0–46.0)
Hemoglobin: 10.9 g/dL — ABNORMAL LOW (ref 12.0–15.0)
Immature Granulocytes: 4 %
Lymphocytes Relative: 14 %
Lymphs Abs: 0.6 10*3/uL — ABNORMAL LOW (ref 0.7–4.0)
MCH: 31.4 pg (ref 26.0–34.0)
MCHC: 36.6 g/dL — ABNORMAL HIGH (ref 30.0–36.0)
MCV: 85.9 fL (ref 80.0–100.0)
Monocytes Absolute: 0.4 10*3/uL (ref 0.1–1.0)
Monocytes Relative: 10 %
Neutro Abs: 3.1 10*3/uL (ref 1.7–7.7)
Neutrophils Relative %: 67 %
Platelets: 183 10*3/uL (ref 150–400)
RBC: 3.47 MIL/uL — ABNORMAL LOW (ref 3.87–5.11)
RDW: 13.2 % (ref 11.5–15.5)
WBC: 4.5 10*3/uL (ref 4.0–10.5)
nRBC: 0 % (ref 0.0–0.2)

## 2020-04-04 LAB — COMPREHENSIVE METABOLIC PANEL
ALT: 18 U/L (ref 0–44)
AST: 22 U/L (ref 15–41)
Albumin: 2.5 g/dL — ABNORMAL LOW (ref 3.5–5.0)
Alkaline Phosphatase: 54 U/L (ref 38–126)
Anion gap: 11 (ref 5–15)
BUN: 64 mg/dL — ABNORMAL HIGH (ref 8–23)
CO2: 22 mmol/L (ref 22–32)
Calcium: 8.4 mg/dL — ABNORMAL LOW (ref 8.9–10.3)
Chloride: 112 mmol/L — ABNORMAL HIGH (ref 98–111)
Creatinine, Ser: 2.11 mg/dL — ABNORMAL HIGH (ref 0.44–1.00)
GFR, Estimated: 25 mL/min — ABNORMAL LOW (ref >60)
Glucose, Bld: 116 mg/dL — ABNORMAL HIGH (ref 70–99)
Potassium: 3.3 mmol/L — ABNORMAL LOW (ref 3.5–5.1)
Sodium: 145 mmol/L (ref 135–145)
Total Bilirubin: 1 mg/dL (ref 0.3–1.2)
Total Protein: 4.7 g/dL — ABNORMAL LOW (ref 6.5–8.1)

## 2020-04-04 LAB — PHOSPHORUS: Phosphorus: 2.4 mg/dL — ABNORMAL LOW (ref 2.5–4.6)

## 2020-04-04 MED ORDER — POTASSIUM CHLORIDE CRYS ER 20 MEQ PO TBCR
40.0000 meq | EXTENDED_RELEASE_TABLET | Freq: Once | ORAL | Status: AC
  Administered 2020-04-04: 08:00:00 40 meq via ORAL
  Filled 2020-04-04: qty 2

## 2020-04-04 NOTE — Progress Notes (Signed)
Patient ID: Isabel Hardy, female   DOB: [birth year], 69 y.o.   MRN: [id number] [facility] Progress Note   Assessment/ Plan:   1. Acute kidney Injury: Based on the available history/timeline of events, appears to be likely ischemic ATN from prolonged prerenal injury/hypotension.  Labs indicate continued renal recovery.  I will relax her fluid restriction as she is now getting hypernatremic indicative of dehydration. 2.  Hypokalemia: Secondary to acute kidney injury, significantly better with intravenous fluids/correction of metabolic acidosis.  Agree with oral replacement. 3.  Anion gap metabolic acidosis: Secondary to acute kidney injury/lactic acidosis and continues to improve with renal recovery/status post volume expansion.  Continue sodium bicarbonate (this can be stopped if bicarbonate levels rise to >24). 4.  COVID-19 infection/pneumonia: On treatment for superimposed bacterial pneumonia with ceftriaxone and azithromycin.  Currently holding dexamethasone and remdesivir given decent oxygen saturation.  Nephrology will sign off at this time and remain available for questions or concerns.  I recommend that she continues to follow-up with her primary care provider for ongoing surveillance of renal function.  Subjective:   Reports that she is tired but continues to feel better with her respiratory standpoint.   Objective:   BP 140/85 (BP Location: Left Arm)   Pulse 99   Temp 98.7 F (37.1 C) (Axillary)   Resp 20   Ht 5' (1.524 m)   Wt 75.4 kg   SpO2 100%   BMI 32.46 kg/m   Intake/Output Summary (Last 24 hours) at 04/04/2020 1050 Last data filed at 04/04/2020 0900 Gross per 24 hour  Intake 1016.77 ml  Output --  Net 1016.77 ml   Weight change: -0.2 kg  Physical Exam: Gen: Appears comfortable sitting up in recliner CVS: Irregularly irregular-borderline tachycardia, S1 and S2 normal. Resp: Clear to auscultation bilaterally without distinct rales or  rhonchi. Abd: Soft, nontender, bowel sounds normal Ext: No lower extremity edema  Imaging: No results found.  Labs: BMET Recent Labs  Lab 04/01/20 1046 04/01/20 1358 04/02/20 0805 04/03/20 0608 04/04/20 0059  NA 140 140 147* 144 145  K 5.3* 5.2* 4.3 3.6 3.3*  CL 112*  --  121* 119* 112*  CO2 8*  --  12* 15* 22  GLUCOSE 180*  --  112* 131* 116*  BUN 152*  --  123* 89* 64*  CREATININE 9.83*  --  5.97* 3.17* 2.11*  CALCIUM 9.0  --  8.8* 8.7* 8.4*  PHOS  --   --   --   --  2.4*   CBC Recent Labs  Lab 04/01/20 1046 04/01/20 1358 04/02/20 0805 04/03/20 0608 04/04/20 0059  WBC 8.8  --  5.5 4.9 4.5  NEUTROABS 6.8  --  4.0 3.7 3.1  HGB 14.3 11.9* 12.6 11.1* 10.9*  HCT 41.8 35.0* 36.1 30.7* 29.8*  MCV 90.5  --  88.0 87.2 85.9  PLT 222  --  191 168 183    Medications:    . apixaban  5 mg Oral BID  . atorvastatin  40 mg Oral QPM  . dronedarone  400 mg Oral BID WC  . metoprolol tartrate  25 mg Oral BID  . sodium bicarbonate  650 mg Oral BID   Zetta Bills, MD 04/04/2020, 10:50 AM

## 2020-04-04 NOTE — Progress Notes (Addendum)
Family Medicine Teaching Service Daily Progress Note Intern Pager: [phone]  Patient name: Isabel Hardy Medical record number: [id number] Date of birth: 1950-08-10 Age: 69 y.o. Gender: female  Primary Care Provider: Buckner Malta, MD Consultants:Nephrology Code Status:Do not want Chest compressionor shock, OK to intubate  Pt Overview and Major Events to Date: Admission on 04/01/2020 Assessment and Plan: Isabel Hardy a 69 y.o.femalepresenting with Hypotentionand fatigue.Dx of COVID on 03/28/20.PMH is significant forAfib on Eliquis, HTN, HLD, Sinus node dysfunction, Lumber Spinal stenosis.   A flutter H/oParoxysmalAfib on Eliquis Hx Sinus bradycardia with pauses, s/p pacemaker S/P Mitral valve replacement 05/2015  Pt asymptomatic.  Denies any complaints.  She is little quicker in her response today.  pulse rate ranged from 95-114/min in last 24 hrs,on monitor pulse rate ranged from 110-130/min. has a dual-chamber pacemaker(03/17/2018)for sinus bradycardia and pauses.History of mitral valve repair with annuloplasty in 2017, developed A fib afterwards.S/P DCCV 01/24/20.Patientlastsaw her cardiologist on 10/20/2021and was noted to be in A fib.Her devicewaslastchecked in 01/19/2020.Home medication includesmetoprololBIDand Multaq 400 mg twice daily per cardiology notes, Pt takes Eliquis 5 mg BID.At last visit, her cardiologist discussed ablation if it reccursagain.  Patient confirmed that she takes Multaq at home. -EKG as needed. -Continue cardiac monitoring -Continue Eliquis5 mgtwice daily. -Continuemetoprololto25 mg BID.SBP is stable now -Continue Multaq 400 mg BID.   Acute Renal Failure (Improving) No history of CKD. Labs are continuing to improve -creatinine is 2.11, BUN 64, Na-145, K-3.3  Renalultrasoundshowedatrophic right kidneyand normal left kidney.CT abdomen/pelvis-atrophic right kidney.Nephrologyconsulted.Appreciate  recommendations.Recommended supportive treatment for now, no dialysis needed. Nephrology saw her yesterday and recommended oral sodium bicarb.  -Nephro consulted.Appreciate recommendations. -Oral sodium bicarbonate per nephrology -DailyCMP. -Monitor I and O. -Daily weight. -F/U Urine Cx -Hold ARB/HCTZin setting of ARF and hypotension -Continue D5%-0.45% NaCl infusion at 100 ml/hr  - renal diet pending improvement -Maintain MAP>65 -Avoid nephrotoxinsand contrast.  COVIDPositivePneumonia Concern for super-imposed bacterial pneumonia Today, She denies any shortness of breath, chest pain.Afebrile. patientsatting at 98% on room air.COVID positive on 03/28/2020.S/P MAB infusion this pastweek.vaccinated against COVID.Chest X ray-Right infrahilar opacity concerning for possible pneumonia.CT abdomen/pelvis confirmed multifocal bilateral pneumonia.Patient wasgiven 1 dose of ceftriaxone and azithromycinfor pneumonia -Contact and air bone precautions. -Vitals per floor. -Maintain oxygen saturation more than 92% -Continue incentive spirometry. - repeat CXR 3-4 weeks after Abx to ensure right hilar opacity has resolved to r/o underlying malignancy  Altered Mental Status(improved) Oriented x4.Talking appropriately but slow to respond. - Cont fluids per above - ARF treatment per above -Continue tohold sedating agents and BP meds  Hypotension Nausea/Vomitting/diarrhoea Patient denies nausea, vomiting, diarrheaptadmitted withhypotension and fatigue. After the evaluation Pt 's nurse reported 3 episodes of diarrhea. Covid positive on 03/28/2020 s/pmonoclonal antibody infusion on 03/28/2020.BP's since admission have been systolic between 151-761 and Diastolic between 60-737  mm hg. Most recent BP- 133/52mmHg,pulse rate ranged from 95-114/minutesince admission.Patient has been afebrile overnight.She is satting at 98% at room air.Blood culture shows staph  hemolytics in 1 bottle possibly contaminated.Morning labssodium 145, potassium 3.3 glucose 131 , BUN 89, creatinine 3.17. PT recommended Home health PT.  -Replace K with KDUR -Monitor vitals per floor. -Follow-up urine culture and blood culture -CMP daily  -Acetaminophen 650 mg as needed for pain headache or fever.  -Continue Dextrose5% -0.45%normal saline at 100 mL/hr. -Continue cardiac monitoring  HTN Patient BPs overnight systolic ranged from  109-151 and diastolic ranged from 76-103 mmHg. Home meds include HCTZ 12.5 daily,Telmisartan-hydrochlorothiazide 40-12.5 mg daily.  -Hold home HTN medicationsin setting  of AKI -Continue  Metoprolol for HR control.  HLD Home meds include Lipitor. -Continue Lipitor 40 mg.  Lumber Spinal stenosis. S/P b/l L4-5 laminectomy and b/l L3-4 laminotomies/foraminotomies 12/2018.Home meds include cyclobenzaprine -Hold home cyclobenzaprinein setting of decreased mentation on arrival.  FEN/GI:Renal diet fluid restriction less than 1200 mL Prophylaxis:Eliquis5 mg BID   Disposition:Progressive Pt is not stable for discharge. Subjective:  Patient sitting in chair comfortably.  Denies any complaints.  Patient denies any chest pain, shortness of breath, dizziness, nausea, vomiting.   Objective: Temp:  [97.8 F (36.6 C)-98.7 F (37.1 C)] 98.5 F (36.9 C) (12/15 0355) Pulse Rate:  [95-114] 95 (12/15 0355) Resp:  [17-20] 19 (12/15 0355) BP: (109-151)/(76-103) 133/76 (12/15 0355) SpO2:  [97 %-100 %] 98 % (12/15 0355) Weight:  [75.4 kg] 75.4 kg (12/15 0355) Physical Exam: General: she is not in acute distress, sitting comfortably in chair.  Satting well at room air Cardiovascular: Irregularly irregular rhythm Respiratory: Clear to auscultation bilaterally, no increased WOB Extremities: no Extremity edema noted.  Laboratory: Recent Labs  Lab 04/02/20 0805 04/03/20 0608 04/04/20 0059  WBC 5.5 4.9 4.5  HGB 12.6 11.1*  10.9*  HCT 36.1 30.7* 29.8*  PLT 191 168 183   Recent Labs  Lab 04/02/20 0805 04/03/20 0608 04/04/20 0059  NA 147* 144 145  K 4.3 3.6 3.3*  CL 121* 119* 112*  CO2 12* 15* 22  BUN 123* 89* 64*  CREATININE 5.97* 3.17* 2.11*  CALCIUM 8.8* 8.7* 8.4*  PROT 5.5* 4.7* 4.7*  BILITOT 1.2 1.0 1.0  ALKPHOS 62 57 54  ALT 20 19 18   AST 27 26 22   GLUCOSE 112* 131* 116*      Imaging/Diagnostic Tests: No new results.  , MD 04/04/2020, 6:53 AM PGY-1, [facility] Family Medicine FPTS Intern pager: [phone], text pages welcome

## 2020-04-05 DIAGNOSIS — Z950 Presence of cardiac pacemaker: Secondary | ICD-10-CM

## 2020-04-05 DIAGNOSIS — N179 Acute kidney failure, unspecified: Secondary | ICD-10-CM

## 2020-04-05 LAB — COMPREHENSIVE METABOLIC PANEL
ALT: 17 U/L (ref 0–44)
AST: 20 U/L (ref 15–41)
Albumin: 2.5 g/dL — ABNORMAL LOW (ref 3.5–5.0)
Alkaline Phosphatase: 55 U/L (ref 38–126)
Anion gap: 11 (ref 5–15)
BUN: 48 mg/dL — ABNORMAL HIGH (ref 8–23)
CO2: 23 mmol/L (ref 22–32)
Calcium: 8.5 mg/dL — ABNORMAL LOW (ref 8.9–10.3)
Chloride: 112 mmol/L — ABNORMAL HIGH (ref 98–111)
Creatinine, Ser: 1.81 mg/dL — ABNORMAL HIGH (ref 0.44–1.00)
GFR, Estimated: 30 mL/min — ABNORMAL LOW (ref >60)
Glucose, Bld: 93 mg/dL (ref 70–99)
Potassium: 3.4 mmol/L — ABNORMAL LOW (ref 3.5–5.1)
Sodium: 146 mmol/L — ABNORMAL HIGH (ref 135–145)
Total Bilirubin: 1 mg/dL (ref 0.3–1.2)
Total Protein: 4.8 g/dL — ABNORMAL LOW (ref 6.5–8.1)

## 2020-04-05 LAB — CBC WITH DIFFERENTIAL/PLATELET
Abs Immature Granulocytes: 0.24 10*3/uL — ABNORMAL HIGH (ref 0.00–0.07)
Basophils Absolute: 0 10*3/uL (ref 0.0–0.1)
Basophils Relative: 1 %
Eosinophils Absolute: 0.2 10*3/uL (ref 0.0–0.5)
Eosinophils Relative: 4 %
HCT: 31.6 % — ABNORMAL LOW (ref 36.0–46.0)
Hemoglobin: 11.2 g/dL — ABNORMAL LOW (ref 12.0–15.0)
Immature Granulocytes: 5 %
Lymphocytes Relative: 16 %
Lymphs Abs: 0.8 10*3/uL (ref 0.7–4.0)
MCH: 30.9 pg (ref 26.0–34.0)
MCHC: 35.4 g/dL (ref 30.0–36.0)
MCV: 87.1 fL (ref 80.0–100.0)
Monocytes Absolute: 0.5 10*3/uL (ref 0.1–1.0)
Monocytes Relative: 9 %
Neutro Abs: 3.2 10*3/uL (ref 1.7–7.7)
Neutrophils Relative %: 65 %
Platelets: 204 10*3/uL (ref 150–400)
RBC: 3.63 MIL/uL — ABNORMAL LOW (ref 3.87–5.11)
RDW: 12.9 % (ref 11.5–15.5)
WBC: 4.9 10*3/uL (ref 4.0–10.5)
nRBC: 0 % (ref 0.0–0.2)

## 2020-04-05 LAB — MAGNESIUM: Magnesium: 1.5 mg/dL — ABNORMAL LOW (ref 1.7–2.4)

## 2020-04-05 MED ORDER — LOPERAMIDE HCL 2 MG PO CAPS: 4.0000 mg | ORAL_CAPSULE | ORAL | 0 refills | Status: DC | PRN

## 2020-04-05 MED ORDER — POTASSIUM CHLORIDE CRYS ER 20 MEQ PO TBCR
40.0000 meq | EXTENDED_RELEASE_TABLET | Freq: Once | ORAL | Status: AC
  Administered 2020-04-05: 10:00:00 40 meq via ORAL
  Filled 2020-04-05: qty 2

## 2020-04-05 MED ORDER — LOPERAMIDE HCL 2 MG PO CAPS
4.0000 mg | ORAL_CAPSULE | ORAL | Status: DC | PRN
  Administered 2020-04-05: 14:00:00 4 mg via ORAL
  Filled 2020-04-05: qty 2

## 2020-04-05 MED ORDER — METOPROLOL TARTRATE 25 MG PO TABS: 25.0000 mg | ORAL_TABLET | Freq: Two times a day (BID) | ORAL | 0 refills | Status: DC

## 2020-04-05 NOTE — Progress Notes (Signed)
Pt given discharge instructions, prescriptions, and care notes. Pt verbalized understanding AEB no further questions or concerns at this time. IV was discontinued, no redness, pain, or swelling noted at this time. Telemetry discontinued and Centralized Telemetry was notified. Pt left the floor via wheelchair with staff in stable condition. 

## 2020-04-05 NOTE — Care Management Important Message (Signed)
Important Message  Patient Details  Name: Isabel Hardy MRN: [id number] Date of Birth: 08/09/1950   Medicare Important Message Given:  Yes - Important Message mailed due to current National Emergency   Verbal consent obtained due to current National Emergency  Relationship to patient: Self Contact Name: Isabel Hardy Call Date: 04/05/20  Time: 1108 Phone: [phone] Outcome: No Answer/Busy Important Message mailed to: Patient address on file    Orson Aloe 04/05/2020, 11:08 AM

## 2020-04-05 NOTE — Discharge Instructions (Signed)
.   Dear Isabel Hardy,   Thank you for letting us participate in your care! In this section, you will find a brief hospital admission summary of why you were admitted to the hospital, what happened during your admission, your diagnosis/diagnoses, and recommended follow up.   You were admitted because you were experiencing hypotension and fatigue.  Your testing revealed Acute Renal failure and Atrial Flutter/Atrial fib.  You were diagnosed with Acute renal failure, COVID pneumonia, Atrial Flutter.  You were treated with Metoprolol, Multaq, IV fluids,  Bicarbonate.   You were also seen by Nephrology. They recommended conservative treatment with IV fluids.   Your symptoms improved and you were discharged from the hospital for meeting this goal.   Your home medications were held due to acute renal failure (HCTZ, losartan-hydrochlorothiazide).  Talk talk to your primary care provider to restart them again.    POST-HOSPITAL & CARE INSTRUCTIONS 1. Do not take hydrochlorothiazide, losartan-hydrochlorothiazide, or lasix until your doctor tells you to do so. 2. Take all medication as recommended below.  3. Please let PCP/Specialists know of any changes in medications that were made.  4. Please see medications section of this packet for any medication changes.  5. Since you tested positive for COVID on 12/3, you have completed your quarantine period.  You do not need to quarantine anymore. DOCTOR'S APPOINTMENTS & FOLLOW UP Future Appointments  Date Time Provider Department Center  05/25/2020  3:20 PM Revankar, Aundra Dubin, MD [facility] None     Thank you for choosing [facility]! Take care and be well!  Family Medicine Teaching Service Inpatient Team [place]  [facility]  [address] [place], [place] [address] [phone]

## 2020-04-05 NOTE — Discharge Summary (Addendum)
Family Medicine Teaching [facility] Discharge Summary  Patient name: Isabel Hardy Medical record number: [id number] Date of birth: Sep 15, 1950 Age: 69 y.o. 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 Satting well at room air. Cardiovascular: Irregularly irregular rhythm Respiratory: Clear to auscultation bilaterally.No increased WOB, No rales, rhonchi and wheezes. Extremities: No extremity edema noted  Brief Hospital Course:  Assessment and Plan: Isabel Hardy is a 69 y.o. female presenting with Hypotention and fatigue. Dx of COVID on 03/28/20. PMH is significant for Afib on Eliquis, HTN, HLD, Sinus node dysfunction, Lumber Spinal stenosis.    Hypotension Nausea/Vomitting/diarrhoea Pt was managed with IV fluids and her vitals were monitored.  Acute Renal Failure  Pt admitted with creatinine of 9.83. Nephrology consulted and recommended supportive treatment with IV fluids.  Patient creatinine improved to 1.81 at discharge.   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 Fib. Pt heart rate was monitored and started on her home medications metoprolol and Multaq. She continued to be in A. Fib. Metoprol was increased to 25 mg BID. Her heart rate was controlled at the time of  discharge.  COVID Positive Pneumonia Pt was noted to be COVID positive on 03/28/2020. S/P MAB infusion. Chest X ray showed Right infrahilar opacity concerning for possible pneumonia. CT abdomen/pelvis confirmed multifocal bilateral pneumonia. Patient was given 1 dose of ceftriaxone and azithromycin for pneumonia but it was discontinued as patient continued to improve and procal was not elevated.  She remained stable on RA throughout her hospitalization and therefore did not meet criteria for steroid or remdesivir treatment.   Issues for Follow Up:  1. Patient needs a repeat PA and lateral CXR in 3-4 weeks to ensure resolution of right infrahilar opacity to rule out malignancy. 2. Monitor renal function with PCP 3. Holding Micardis HCTZ due to renal failure.  Patient also had two prescriptions for HCTZ, please assess need for HCTZ in Micardias and HCTZ separately if placed back on. 4. Assess fluid status at her follow up appointment as Lasix was held on discharge due to renal failure.  Restart Lasix as indicated. 5. Monitor Bps as antihypertensives are being held 6. Monitor HR, Metoprolol dose increased to 25mg  BID in the hospital due to holding other antihypertensives for ARF. 7. Discontinue oral sodium bicarb when CO2 >24 on BMP. 8. Given patient's age, would recommend discontinuing xanax and flexeril. 9. Assess need for scheduled potassium supplementation at home.  Please consider restarting if restarting Lasix.  Significant Procedures: None  Significant Labs and Imaging:  Recent Labs  Lab 04/03/20 0608 04/04/20 0059 04/05/20 0456  WBC 4.9 4.5 4.9  HGB 11.1* 10.9* 11.2*  HCT 30.7* 29.8* 31.6*  PLT 168 183 204   Recent Labs  Lab 04/01/20 1046 04/01/20 1358 04/02/20 0805 04/03/20 0608 04/04/20 0059 04/05/20 0456 04/05/20 0955  NA 140 140 147* 144 145 146*  --   K 5.3* 5.2* 4.3 3.6  3.3* 3.4*  --   CL 112*  --  121* 119* 112* 112*  --   CO2 8*  --  12* 15* 22 23  --   GLUCOSE  180*  --  112* 131* 116* 93  --   BUN 152*  --  123* 89* 64* 48*  --   CREATININE 9.83*  --  5.97* 3.17* 2.11* 1.81*  --   CALCIUM 9.0  --  8.8* 8.7* 8.4* 8.5*  --   MG  --   --   --   --   --   --  1.5*  PHOS  --   --   --   --  2.4*  --   --   ALKPHOS 66  --  62 57 54 55  --   AST 30  --  27 26 22 20   --   ALT 22  --  20 19 18 17   --   ALBUMIN 3.2*  --  2.8* 2.4* 2.5* 2.5*  --      Results/Tests Pending at Time of Discharge: none  Discharge Medications:  Allergies as of 04/05/2020   No Known Allergies     Medication List    STOP taking these medications   ALPRAZolam 0.5 MG tablet Commonly known as: XANAX   cyclobenzaprine 10 MG tablet Commonly known as: FLEXERIL   docusate sodium 100 MG capsule Commonly known as: COLACE   furosemide 20 MG tablet Commonly known as: LASIX   hydrochlorothiazide 12.5 MG tablet Commonly known as: HYDRODIURIL   potassium chloride SA 20 MEQ tablet Commonly known as: KLOR-CON   telmisartan-hydrochlorothiazide 40-12.5 MG tablet Commonly known as: MICARDIS HCT     TAKE these medications   acetaminophen 650 MG CR tablet Commonly known as: TYLENOL Take 650 mg by mouth every 8 (eight) hours as needed for pain.   apixaban 5 MG Tabs tablet Commonly known as: ELIQUIS Take 5 mg by mouth 2 (two) times a day.   atorvastatin 40 MG tablet Commonly known as: LIPITOR Take 40 mg by mouth every evening.   BIOFREEZE EX Apply 1 application topically daily as needed (pain).   Calcium 1200 1200-1000 MG-UNIT Chew Chew 1 tablet by mouth daily.   Dialyvite Vitamin D 5000 125 MCG (5000 UT) capsule Generic drug: Cholecalciferol Take 2,000 Units by mouth daily.   dronedarone 400 MG tablet Commonly known as: MULTAQ Take 400 mg by mouth 2 (two) times daily with a meal.   loperamide 2 MG capsule Commonly known as: IMODIUM Take 2 capsules (4 mg total) by mouth as needed for diarrhea or loose stools.   Magnesium Oxide 250 MG Tabs Take 250 mg by  mouth 2 (two) times daily.   metoprolol tartrate 25 MG tablet Commonly known as: LOPRESSOR Take 1 tablet (25 mg total) by mouth 2 (two) times daily. What changed:   how much to take  when to take this            Durable Medical Equipment  (From admission, onward)         Start     Ordered   04/03/20 0928  For home use only DME 3 n 1  Once        04/03/20 0927   04/03/20 0927  For home use only DME Walker rolling  Once       Question Answer Comment  Walker: With 5 Inch Wheels   Patient needs a walker to treat with the following condition  Weakness      04/03/20 0927          Discharge Instructions: Please refer to Patient Instructions section of EMR for full details.  Patient was counseled important signs and symptoms that should prompt return to medical care, changes in medications, dietary instructions, activity restrictions, and follow up appointments.   Follow-Up Appointments:  Follow-up Information    [facility] Follow up.   Specialty: Home Health Services Why: For home health services  Contact information: [address] [place] [address] [phone]        Buckner Malta, MD. Schedule an appointment as soon as possible for a visit in 1 week(s).   Specialty: Family Medicine Contact information: [address] [place] [address] [phone]               Karsten Ro, MD 04/05/2020, 5:11 PM PGY-1, [facility]  FPTS Upper-Level Resident Addendum   I have independently interviewed and examined the patient. I have discussed the above with the original author and agree with their documentation. My edits for correction/addition/clarification have been made.  Luis Abed, D.O. PGY-3, [facility] 04/06/2020 9:22 AM

## 2020-04-05 NOTE — Progress Notes (Signed)
Family Medicine Teaching Service Daily Progress Note Intern Pager: [phone]  Patient name: Isabel Hardy Medical record number: [id number] Date of birth: Jan 26, 1951 Age: 69 y.o. Gender: female  Primary Care Provider: Buckner Malta, MD  Consultants:Nephrology Code Status:Do not want Chest compressionor shock, OK to intubate  Pt Overview and Major Events to Date: Admission on 04/01/2020 Assessment and Plan: RAEGYN RENDA a 69 y.o.femalepresenting with Hypotentionand fatigue.Dx of COVID on 03/28/20.PMH is significant forAfib on Eliquis, HTN, HLD, Sinus node dysfunction, Lumber Spinal stenosis Pt is stable to discharge today.  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  Denies any complaints.  pulse rate ranged from94-100/min in last 24 hrs. Pt has a dual-chamber pacemaker(03/17/2018)for sinus bradycardia and pauses.History of mitral valve repair with annuloplasty in 2017, developed A fib afterwards.S/P DCCV 01/24/20.Patientlastsaw her cardiologist on 10/20/2021and was noted to be in A fib.Her devicewaslastchecked in 01/19/2020.Home medication includesmetoprololBIDand Multaq 400 mg twice daily per cardiology notes, Pt takes Eliquis 5 mg BID.At last visit, her cardiologist discussed ablation if it reccursagain.  Patient confirmed that she takes Multaq at home. -EKG as needed. -Continue cardiac monitoring  -Continue Eliquis5 mgtwice daily.  -Continuemetoprololto25mg  BID.SBP is stable now -Continue Multaq 400 mg BID.  Acute Renal Failure (Improving) Pt denies any complaints. No history of CKD. Labs  Improved since admission. -creatinineis 1.81, BUN 48, Na-146, K-3.4  Renalultrasoundshowedatrophic right kidneyand normal left kidney.CT abdomen/pelvis-atrophic right kidney.Nephrologyconsulted.Appreciate recommendations.Recommended  supportive treatment for now, no dialysis needed. Nephrology signed off.  -Nephro signed off. Appreciate Recs and management. -DailyCMP. -Monitor I and O. -Daily weight. -Hold ARB/HCTZin setting of ARF and hypotension -ContinueD5%-0.45% NaCl infusion at 100 ml/hr - renal diet pending improvement -Maintain MAP>65 -Avoid nephrotoxinsand contrast.  COVIDPositivePneumonia Concern for super-imposed bacterial pneumonia Today,She denies any shortness of breath, chest pain.Afebrile.patientsatting at 94% on room air.COVID positive on 03/28/2020.S/P MAB infusion this pastweek.vaccinated against COVID.Chest X ray-Right infrahilar opacity concerning for possible pneumonia.CT abdomen/pelvis confirmed multifocal bilateral pneumonia.Patient wasgiven 1 dose of ceftriaxone and azithromycinfor pneumonia -Contact and air bone precautions. -Vitals per floor. -Maintain oxygen saturation more than 92% -Continue incentive spirometry. - repeat CXR 3-4 weeks after Abx to ensure right hilar opacity has resolved to r/o underlying malignancy  Altered Mental Status(improved) Oriented x 4.Talking appropriatelybut slow to respond. - Cont fluids per above - ARF treatment per above -Continue tohold sedating agents and BP meds  Hypotension Nausea/Vomitting/diarrhoea This morning patient denies any complaints. Covid positive on 03/28/2020 s/pmonoclonal antibody infusion on 03/28/2020.BP's since admission have been systolic between111-149 and Diastolic between 84-16SA hg. Most recent BP- 149/21mmHg,pulse rate ranged from94-100/minutesince admission.Patient has been afebrile overnight.She is satting at 98% at room air.Blood culture shows staph hemolytics in 1 bottle possibly contaminated.Morning labssodium146, potassium 3.4glucose 93, BUN48, creatinine1.81. PT recommended Home health PT. -Monitor vitals per floor. -Follow-up urine culture and blood culture -CMP daily   -Acetaminophen 650 mg as needed for pain headache or fever.  -ContinueDextrose5% -0.45%normal saline at 100 mL/hr. -Continue cardiac monitoring  HTN Patient BPs overnight systolic ranged from 111-149 and diastolic ranged from64-57mmHg. Home meds include HCTZ 12.5 daily,Telmisartan-hydrochlorothiazide 40-12.5 mg daily.  -Hold home HTN medicationsin setting ofAKI -ContinueMetoprolol for HR control.  HLD Home meds include Lipitor. -ContinueLipitor 40 mg.  Lumber Spinal stenosis. S/P b/l L4-5 laminectomy and b/l L3-4 laminotomies/foraminotomies 12/2018.Home meds include cyclobenzaprine -Hold home cyclobenzaprinein setting of decreased mentation on arrival.  FEN/GI:Renal diet fluid restriction less than 1200 mL Prophylaxis:Eliquis5 mg BID  Disposition:Progressive Pt is not stable for discharge.  Subjective:  Patient sitting in chair comfortably .  Patient denies any complaints.  Objective: Temp:  [97.5 F (36.4 C)-98.7 F (37.1 C)] 97.5 F (36.4 C) (12/16 0445) Pulse Rate:  [94-100] 98 (12/16 0445) Resp:  [16-20] 16 (12/16 0445) BP: (111-149)/(64-97) 149/73 (12/16 0445) SpO2:  [93 %-100 %] 93 % (12/16 0445) Weight:  [74.9 kg] 74.9 kg (12/16 0445) Physical Exam: General: She is not in acute distress, sitting in chair comfortably.satting well at room air. Cardiovascular: Irregularly irregular rhythm Respiratory: Clear to auscultation bilaterally.no increased WOB Extremities: No extremity edema noted  Laboratory: Recent Labs  Lab 04/03/20 0608 04/04/20 0059 04/05/20 0456  WBC 4.9 4.5 4.9  HGB 11.1* 10.9* 11.2*  HCT 30.7* 29.8* 31.6*  PLT 168 183 204   Recent Labs  Lab 04/03/20 0608 04/04/20 0059 04/05/20 0456  NA 144 145 146*  K 3.6 3.3* 3.4*  CL 119* 112* 112*  CO2 15* 22 23  BUN 89* 64* 48*  CREATININE 3.17* 2.11* 1.81*  CALCIUM 8.7* 8.4* 8.5*  PROT 4.7* 4.7* 4.8*  BILITOT 1.0 1.0 1.0  ALKPHOS 57 54 55  ALT 19 18 17   AST 26  22 20   GLUCOSE 131* 116* 93     Imaging/Diagnostic Tests: No new results.  , MD 04/05/2020, 6:57 AM PGY-1, [facility] Family Medicine FPTS Intern pager: [phone], text pages welcome

## 2020-04-05 NOTE — Progress Notes (Signed)
Occupational Therapy Treatment Patient Details Name: Isabel Hardy MRN: [id number] DOB: 12-Dec-1950 Today's Date: 04/05/2020    History of present illness Pt is 69 y.o. female presented 04/01/20 with Hypotension and fatigue. Dx of COVID on 03/28/20. acute on chronic kidney disease. PMH is significant for Afib on Eliquis, HTN, HLD, Sinus node dysfunction, Lumber Spinal stenosis.   OT comments  Pt seen for OT follow up session with focus on ADL mobility progression. Pt on RA with VSS throughout entirety of session with varying activity levels/exertion. Pt able to complete toilet transfer at min guard level for safety, and peri care with min A for thorough completion. She was then able to complete mobility in hallway at min guard assist for a household distance on RA. HR >130 bpm but leads were not appropriately placed at this time. Reviewed fall prevention strategies for home environment. Pt continuing to present with decreased safety awareness and requires supervision and cues, but improved from previous sessions. D/c recs remain appropriate, will continue to follow.   Follow Up Recommendations  Home health OT;Supervision/Assistance - 24 hour    Equipment Recommendations  3 in 1 bedside commode    Recommendations for Other Services      Precautions / Restrictions Precautions Precautions: Fall Restrictions Weight Bearing Restrictions: No       Mobility Bed Mobility Overal bed mobility: Needs Assistance Bed Mobility: Sit to Supine     Supine to sit: Min guard        Transfers Overall transfer level: Needs assistance Equipment used: None Transfers: Sit to/from [employer] Sit to Stand: Min guard Stand pivot transfers: Min guard       General transfer comment: close guard for safety. No device or overt LOB    Balance Overall balance assessment: Needs assistance Sitting-balance support: Feet supported Sitting balance-Leahy Scale: Good     Standing  balance support: No upper extremity supported;During functional activity   Standing balance comment: mobilized in hallway without use of AE                           ADL either performed or assessed with clinical judgement   ADL Overall ADL's : Needs assistance/impaired         Upper Body Bathing: Set up;Sitting Upper Body Bathing Details (indicate cue type and reason): set up assist with wash cloths to wash upper body             Toilet Transfer: Min guard;Stand-pivot;BSC   Toileting- Clothing Manipulation and Hygiene: Minimal assistance;Sit to/from stand Toileting - Clothing Manipulation Details (indicate cue type and reason): min a for thorough task completion. Able to stand and squat to clean both anterior and posterior peri area     Functional mobility during ADLs: Min guard;Cueing for safety General ADL Comments: noted improved response times and cognition from OT eval. Able to progress mobility household distances without notable LOB or fall risk     Vision Patient Visual Report: No change from baseline     Perception     Praxis      Cognition Arousal/Alertness: Awake/alert Behavior During Therapy: Flat affect Overall Cognitive Status: Impaired/Different from baseline Area of Impairment: Safety/judgement;Problem solving                         Safety/Judgement: Decreased awareness of safety;Decreased awareness of deficits   Problem Solving: Slow processing General Comments: continues to have slow procesing, but  this has improved from last OT session. Also continues to require cues and education for general safety and deficits        Exercises     Shoulder Instructions       General Comments      Pertinent Vitals/ Pain       Pain Assessment: No/denies pain  Home Living                                          Prior Functioning/Environment              Frequency  Min 2X/week        Progress  Toward Goals  OT Goals(current goals can now be found in the care plan section)  Progress towards OT goals: Progressing toward goals  Acute Rehab OT Goals Patient Stated Goal: return home OT Goal Formulation: Patient unable to participate in goal setting Time For Goal Achievement: 04/16/20 Potential to Achieve Goals: Good  Plan Discharge plan remains appropriate    Co-evaluation                 AM-PAC OT "6 Clicks" Daily Activity     Outcome Measure   Help from another person eating meals?: A Little Help from another person taking care of personal grooming?: A Little Help from another person toileting, which includes using toliet, bedpan, or urinal?: A Little Help from another person bathing (including washing, rinsing, drying)?: A Little Help from another person to put on and taking off regular upper body clothing?: A Little Help from another person to put on and taking off regular lower body clothing?: A Little 6 Click Score: 18    End of Session    OT Visit Diagnosis: Unsteadiness on feet (R26.81);Other abnormalities of gait and mobility (R26.89);Muscle weakness (generalized) (M62.81);Other symptoms and signs involving cognitive function   Activity Tolerance Patient tolerated treatment well   Patient Left in bed;with call bell/phone within reach   Nurse Communication Mobility status        Time: 7989-2119 OT Time Calculation (min): 25 min  Charges: OT General Charges $OT Visit: 1 Visit OT Treatments $Self Care/Home Management : 23-37 mins  Dalphine Handing, MSOT, OTR/L Acute Rehabilitation Services [facility] Office Number: [phone] Pager: [phone]  Dalphine Handing 04/05/2020, 10:58 AM

## 2020-04-06 LAB — CULTURE, BLOOD (ROUTINE X 2)
Culture: NO GROWTH
Special Requests: ADEQUATE

## 2020-04-11 DIAGNOSIS — I5042 Chronic combined systolic (congestive) and diastolic (congestive) heart failure: Secondary | ICD-10-CM | POA: Diagnosis not present

## 2020-04-11 DIAGNOSIS — E876 Hypokalemia: Secondary | ICD-10-CM | POA: Diagnosis not present

## 2020-04-11 DIAGNOSIS — N179 Acute kidney failure, unspecified: Secondary | ICD-10-CM | POA: Diagnosis not present

## 2020-04-11 DIAGNOSIS — I4891 Unspecified atrial fibrillation: Secondary | ICD-10-CM | POA: Diagnosis not present

## 2020-04-11 DIAGNOSIS — A0839 Other viral enteritis: Secondary | ICD-10-CM | POA: Diagnosis not present

## 2020-04-11 DIAGNOSIS — R110 Nausea: Secondary | ICD-10-CM | POA: Diagnosis not present

## 2020-04-11 DIAGNOSIS — Z7689 Persons encountering health services in other specified circumstances: Secondary | ICD-10-CM | POA: Diagnosis not present

## 2020-04-11 DIAGNOSIS — U071 COVID-19: Secondary | ICD-10-CM | POA: Diagnosis not present

## 2020-04-19 DIAGNOSIS — S39012A Strain of muscle, fascia and tendon of lower back, initial encounter: Secondary | ICD-10-CM | POA: Diagnosis not present

## 2020-05-03 DIAGNOSIS — I110 Hypertensive heart disease with heart failure: Secondary | ICD-10-CM | POA: Diagnosis not present

## 2020-05-03 DIAGNOSIS — Z23 Encounter for immunization: Secondary | ICD-10-CM | POA: Diagnosis not present

## 2020-05-03 DIAGNOSIS — Z6830 Body mass index (BMI) 30.0-30.9, adult: Secondary | ICD-10-CM | POA: Diagnosis not present

## 2020-05-03 DIAGNOSIS — Z79899 Other long term (current) drug therapy: Secondary | ICD-10-CM | POA: Diagnosis not present

## 2020-05-03 DIAGNOSIS — U071 COVID-19: Secondary | ICD-10-CM | POA: Diagnosis not present

## 2020-05-17 DIAGNOSIS — Z45018 Encounter for adjustment and management of other part of cardiac pacemaker: Secondary | ICD-10-CM | POA: Diagnosis not present

## 2020-05-17 DIAGNOSIS — Z6830 Body mass index (BMI) 30.0-30.9, adult: Secondary | ICD-10-CM | POA: Diagnosis not present

## 2020-05-17 DIAGNOSIS — I110 Hypertensive heart disease with heart failure: Secondary | ICD-10-CM | POA: Diagnosis not present

## 2020-05-17 DIAGNOSIS — U071 COVID-19: Secondary | ICD-10-CM | POA: Diagnosis not present

## 2020-05-21 ENCOUNTER — Other Ambulatory Visit: Payer: Self-pay

## 2020-05-21 DIAGNOSIS — I10 Essential (primary) hypertension: Secondary | ICD-10-CM | POA: Insufficient documentation

## 2020-05-21 DIAGNOSIS — I499 Cardiac arrhythmia, unspecified: Secondary | ICD-10-CM | POA: Insufficient documentation

## 2020-05-21 DIAGNOSIS — I639 Cerebral infarction, unspecified: Secondary | ICD-10-CM | POA: Insufficient documentation

## 2020-05-21 DIAGNOSIS — I4891 Unspecified atrial fibrillation: Secondary | ICD-10-CM | POA: Insufficient documentation

## 2020-05-21 DIAGNOSIS — I4819 Other persistent atrial fibrillation: Secondary | ICD-10-CM | POA: Insufficient documentation

## 2020-05-21 DIAGNOSIS — Z9889 Other specified postprocedural states: Secondary | ICD-10-CM | POA: Insufficient documentation

## 2020-05-25 ENCOUNTER — Encounter: Payer: Self-pay | Admitting: Cardiology

## 2020-05-25 ENCOUNTER — Other Ambulatory Visit: Payer: Self-pay

## 2020-05-25 ENCOUNTER — Ambulatory Visit (INDEPENDENT_AMBULATORY_CARE_PROVIDER_SITE_OTHER): Payer: Medicare PPO | Admitting: Cardiology

## 2020-05-25 VITALS — BP 152/84 | HR 88 | Ht 60.0 in | Wt 163.2 lb

## 2020-05-25 DIAGNOSIS — Z9889 Other specified postprocedural states: Secondary | ICD-10-CM

## 2020-05-25 DIAGNOSIS — I480 Paroxysmal atrial fibrillation: Secondary | ICD-10-CM | POA: Diagnosis not present

## 2020-05-25 DIAGNOSIS — I10 Essential (primary) hypertension: Secondary | ICD-10-CM | POA: Diagnosis not present

## 2020-05-25 DIAGNOSIS — Z950 Presence of cardiac pacemaker: Secondary | ICD-10-CM | POA: Diagnosis not present

## 2020-05-25 DIAGNOSIS — E785 Hyperlipidemia, unspecified: Secondary | ICD-10-CM | POA: Diagnosis not present

## 2020-05-25 NOTE — Patient Instructions (Signed)
Medication Instructions:  No medication changes. *If you need a refill on your cardiac medications before your next appointment, please call your pharmacy*   Lab Work: Your physician recommends that you have labs done in the office today. Your test included  basic metabolic panel, complete blood count, TSH, and liver function.  If you have labs (blood work) drawn today and your tests are completely normal, you will receive your results only by: . MyChart Message (if you have MyChart) OR . A paper copy in the mail If you have any lab test that is abnormal or we need to change your treatment, we will call you to review the results.   Testing/Procedures: None ordered   Follow-Up: At [facility], you and your health needs are our priority.  As part of our continuing mission to provide you with exceptional heart care, we have created designated Provider Care Teams.  These Care Teams include your primary Cardiologist (physician) and Advanced Practice Providers (APPs -  Physician Assistants and Nurse Practitioners) who all work together to provide you with the care you need, when you need it.  We recommend signing up for the patient portal called "MyChart".  Sign up information is provided on this After Visit Summary.  MyChart is used to connect with patients for Virtual Visits (Telemedicine).  Patients are able to view lab/test results, encounter notes, upcoming appointments, etc.  Non-urgent messages can be sent to your provider as well.   To learn more about what you can do with MyChart, go to [email or web address].    Your next appointment:   6 month(s)  The format for your next appointment:   In Person  Provider:   Rajan Revankar, MD   Other Instructions NA   

## 2020-05-25 NOTE — Progress Notes (Signed)
Cardiology Office Note:    Date:  05/25/2020   ID:  Isabel Hardy, DOB [birth year], MRN [id number]  PCP:  Buckner Malta, MD  Cardiologist:  Garwin Brothers, MD   Referring MD: Buckner Malta, MD    ASSESSMENT:    1. Paroxysmal A-fib (HCC)   2. Essential hypertension   3. S/P MVR (mitral valve repair)   4. Presence of permanent cardiac pacemaker   5. Dyslipidemia    PLAN:    In order of problems listed above:  1. Paroxysmal atrial fibrillation:I discussed with the patient atrial fibrillation, disease process. Management and therapy including rate and rhythm control, anticoagulation benefits and potential risks were discussed extensively with the patient. Patient had multiple questions which were answered to patient's satisfaction. 2. Essential hypertension: Her blood pressures are fine at home I discussed this with her at length.  Lifestyle modification was urged 3. Post permanent cardiac pacemaker: Stable rhythm at this time.  She is atrial paced. 4. Post Covid infection and renal insufficiency: Stable at this time.  We will do blood work today including renal function and sent to primary care doctor. 5. Mixed dyslipidemia: Diet was emphasized and she promises to focus on the diet and do better.  She was also advised to do a graded exercise and increase effort tolerance. 6. Patient will be seen in follow-up appointment in 6 months or earlier if the patient has any concerns    Medication Adjustments/Labs and Tests Ordered: Current medicines are reviewed at length with the patient today.  Concerns regarding medicines are outlined above.  No orders of the defined types were placed in this encounter.  No orders of the defined types were placed in this encounter.    No chief complaint on file.    History of Present Illness:    Isabel Hardy is a 70 y.o. female.  Patient has past medical history of paroxysmal defibrillation, essential hypertension and  dyslipidemia.  She contracted Covid and had pneumonitis and was admitted to the hospital.  She underwent significant treatment.  She had a renal insufficiency and renal insult.  Subsequently she has been treated and her renal function is better.  She denies any chest pain orthopnea or PND.  She takes care of activities of daily living.  At the time of my evaluation, the patient is alert awake oriented and in no distress.  Past Medical History:  Diagnosis Date  . Acute renal failure (HCC)   . Atherosclerosis of aorta (HCC) 04/02/2020  . ATN (acute tubular necrosis) (HCC) 04/02/2020  . Atrial fibrillation (HCC)   . Atrial flutter (HCC) 10/03/2015  . Atrophic kidney, Right 04/02/2020   04/01/20 Renal US  . COVID-19 virus infection 04/01/2020  . Dyslipidemia 11/23/2014  . Dysrhythmia   . Essential hypertension 11/23/2014  . H/O mitral valve repair   . Hematuria 04/02/2020  . Hx of Grade III diastolic dysfunction 10/30/2017   TTE 10/30/2017  . Hypertension   . Hypovolemic Hypotension 04/02/2020  . Paroxysmal A-fib (HCC) 10/03/2015   Added automatically from request for surgery [id number]  Formatting of this note might be different from the original. Added automatically from request for surgery [id number]  . Pneumonia due to COVID-19 virus 04/02/2020  . Presence of permanent cardiac pacemaker   . Pyuria 04/02/2020  . S/P MVR (mitral valve repair) 10/03/2015  . Sinus node dysfunction (HCC) 09/24/2016  . Somnolence 04/02/2020  . Spinal stenosis of lumbar region with neurogenic claudication 01/19/2019  . Stroke [facility])  per patient, "mini stroke in 2005/06"    Past Surgical History:  Procedure Laterality Date  . ABDOMINAL HYSTERECTOMY    . APPENDECTOMY    . CATARACT EXTRACTION W/ INTRAOCULAR LENS IMPLANT Left   . CESAREAN SECTION    . CHOLECYSTECTOMY    . EYE SURGERY    . GALLBLADDER SURGERY    . LEFT ATRIAL APPENDAGE OCCLUSION     Left Appendage Ligation at [facility]  . LUMBAR LAMINECTOMY/DECOMPRESSION  MICRODISCECTOMY N/A 01/19/2019   Procedure: LUMBAR LAMINECTOMY AND FORAMINOTOMY LUMBAR THREE- LUMBAR FOUR, LUMBAR FOUR- LUMBAR FIVE;  Surgeon: Tressie Stalker, MD;  Location: [facility] OR;  Service: Neurosurgery;  Laterality: N/A;  LUMBAR LAMINECTOMY AND FORAMINOTOMY LUMBAR THREE- LUMBAR FOUR, LUMBAR FOUR- LUMBAR FIVE  . MAZE     [facility]  . MITRAL VALVE ANNULOPLASTY     [facility]  . open heart surgery    . TONSILLECTOMY      Current Medications: Current Meds  Medication Sig  . acetaminophen (TYLENOL) 650 MG CR tablet Take 650 mg by mouth every 8 (eight) hours as needed for pain.  Marland Kitchen apixaban (ELIQUIS) 5 MG TABS tablet Take 5 mg by mouth 2 (two) times a day.   Marland Kitchen atorvastatin (LIPITOR) 40 MG tablet Take 40 mg by mouth every evening.   . Calcium Carbonate-Vit D-Min (CALCIUM 1200) 1200-1000 MG-UNIT CHEW Chew 1 tablet by mouth daily.  . Cholecalciferol (DIALYVITE VITAMIN D 5000) 125 MCG (5000 UT) capsule Take 2,000 Units by mouth daily.   Marland Kitchen dronedarone (MULTAQ) 400 MG tablet Take 400 mg by mouth 2 (two) times daily with a meal.  . furosemide (LASIX) 20 MG tablet Take 20 mg by mouth daily in the afternoon.  . loperamide (IMODIUM) 2 MG capsule Take 2 capsules (4 mg total) by mouth as needed for diarrhea or loose stools.  . Menthol, Topical Analgesic, (BIOFREEZE EX) Apply 1 application topically daily as needed (pain).  . metoprolol tartrate (LOPRESSOR) 25 MG tablet Take 1 tablet (25 mg total) by mouth 2 (two) times daily.  Marland Kitchen telmisartan (MICARDIS) 40 MG tablet Take 40 mg by mouth daily.     Allergies:   Patient has no known allergies.   Social History   Socioeconomic History  . Marital status: Married    Spouse name: Not on file  . Number of children: Not on file  . Years of education: Not on file  . Highest education level: Not on file  Occupational History  . Not on file  Tobacco Use  . Smoking status: Never Smoker  . Smokeless tobacco: Never Used  Vaping Use  . Vaping Use: Never used   Substance and Sexual Activity  . Alcohol use: No  . Drug use: No  . Sexual activity: Not on file  Other Topics Concern  . Not on file  Social History Narrative  . Not on file   Social Determinants of Health   Financial Resource Strain: Not on file  Food Insecurity: Not on file  Transportation Needs: Not on file  Physical Activity: Not on file  Stress: Not on file  Social Connections: Not on file     Family History: The patient's family history includes Heart attack in her father; Hypertension in her mother; Kidney disease in her mother.  ROS:   Please see the history of present illness.    All other systems reviewed and are negative.  EKGs/Labs/Other Studies Reviewed:    The following studies were reviewed today: EKG reveals sinus rhythm and nonspecific ST-T  changes   Recent Labs: 04/05/2020: ALT 17; BUN 48; Creatinine, Ser 1.81; Hemoglobin 11.2; Magnesium 1.5; Platelets 204; Potassium 3.4; Sodium 146  Recent Lipid Panel    Component Value Date/Time   CHOL 151 12/30/2016 1155   TRIG 228 (H) 04/01/2020 1046   HDL 55 12/30/2016 1155   CHOLHDL 2.7 12/30/2016 1155   LDLCALC 76 12/30/2016 1155    Physical Exam:    VS:  BP (!) 152/84   Pulse 88   Ht 5' (1.524 m)   Wt 163 lb 3.2 oz (74 kg)   SpO2 98%   BMI 31.87 kg/m     Wt Readings from Last 3 Encounters:  05/25/20 163 lb 3.2 oz (74 kg)  04/05/20 165 lb 1.6 oz (74.9 kg)  11/22/19 169 lb (76.7 kg)     GEN: Patient is in no acute distress HEENT: Normal NECK: No JVD; No carotid bruits LYMPHATICS: No lymphadenopathy CARDIAC: Hear sounds regular, 2/6 systolic murmur at the apex. RESPIRATORY:  Clear to auscultation without rales, wheezing or rhonchi  ABDOMEN: Soft, non-tender, non-distended MUSCULOSKELETAL:  No edema; No deformity  SKIN: Warm and dry NEUROLOGIC:  Alert and oriented x 3 PSYCHIATRIC:  Normal affect   Signed, Garwin Brothers, MD  05/25/2020 3:42 PM    [facility]

## 2020-05-26 LAB — CBC WITH DIFFERENTIAL/PLATELET
Basophils Absolute: 0 10*3/uL (ref 0.0–0.2)
Basos: 1 %
EOS (ABSOLUTE): 0.2 10*3/uL (ref 0.0–0.4)
Eos: 3 %
Hematocrit: 37.4 % (ref 34.0–46.6)
Hemoglobin: 12.9 g/dL (ref 11.1–15.9)
Immature Grans (Abs): 0.1 10*3/uL (ref 0.0–0.1)
Immature Granulocytes: 1 %
Lymphocytes Absolute: 1.2 10*3/uL (ref 0.7–3.1)
Lymphs: 18 %
MCH: 30.2 pg (ref 26.6–33.0)
MCHC: 34.5 g/dL (ref 31.5–35.7)
MCV: 88 fL (ref 79–97)
Monocytes Absolute: 0.5 10*3/uL (ref 0.1–0.9)
Monocytes: 7 %
Neutrophils Absolute: 4.7 10*3/uL (ref 1.4–7.0)
Neutrophils: 70 %
Platelets: 246 10*3/uL (ref 150–450)
RBC: 4.27 x10E6/uL (ref 3.77–5.28)
RDW: 14.8 % (ref 11.7–15.4)
WBC: 6.7 10*3/uL (ref 3.4–10.8)

## 2020-05-26 LAB — BASIC METABOLIC PANEL
BUN/Creatinine Ratio: 12 (ref 12–28)
BUN: 15 mg/dL (ref 8–27)
CO2: 25 mmol/L (ref 20–29)
Calcium: 9.6 mg/dL (ref 8.7–10.3)
Chloride: 103 mmol/L (ref 96–106)
Creatinine, Ser: 1.23 mg/dL — ABNORMAL HIGH (ref 0.57–1.00)
GFR calc Af Amer: 52 mL/min/{1.73_m2} — ABNORMAL LOW (ref >59)
GFR calc non Af Amer: 45 mL/min/{1.73_m2} — ABNORMAL LOW (ref >59)
Glucose: 89 mg/dL (ref 65–99)
Potassium: 3.5 mmol/L (ref 3.5–5.2)
Sodium: 147 mmol/L — ABNORMAL HIGH (ref 134–144)

## 2020-05-26 LAB — HEPATIC FUNCTION PANEL
ALT: 25 IU/L (ref 0–32)
AST: 25 IU/L (ref 0–40)
Albumin: 4.5 g/dL (ref 3.8–4.8)
Alkaline Phosphatase: 123 IU/L — ABNORMAL HIGH (ref 44–121)
Bilirubin Total: 1.4 mg/dL — ABNORMAL HIGH (ref 0.0–1.2)
Bilirubin, Direct: 0.37 mg/dL (ref 0.00–0.40)
Total Protein: 6.4 g/dL (ref 6.0–8.5)

## 2020-05-26 LAB — TSH: TSH: 1.74 u[IU]/mL (ref 0.450–4.500)

## 2020-06-12 DIAGNOSIS — Z6831 Body mass index (BMI) 31.0-31.9, adult: Secondary | ICD-10-CM | POA: Diagnosis not present

## 2020-06-12 DIAGNOSIS — N183 Chronic kidney disease, stage 3 unspecified: Secondary | ICD-10-CM | POA: Diagnosis not present

## 2020-06-12 DIAGNOSIS — I110 Hypertensive heart disease with heart failure: Secondary | ICD-10-CM | POA: Diagnosis not present

## 2020-06-12 DIAGNOSIS — I4891 Unspecified atrial fibrillation: Secondary | ICD-10-CM | POA: Diagnosis not present

## 2020-06-15 DIAGNOSIS — E785 Hyperlipidemia, unspecified: Secondary | ICD-10-CM | POA: Diagnosis not present

## 2020-06-15 DIAGNOSIS — I4892 Unspecified atrial flutter: Secondary | ICD-10-CM | POA: Diagnosis not present

## 2020-06-15 DIAGNOSIS — I110 Hypertensive heart disease with heart failure: Secondary | ICD-10-CM | POA: Diagnosis not present

## 2020-06-15 DIAGNOSIS — Z7901 Long term (current) use of anticoagulants: Secondary | ICD-10-CM | POA: Diagnosis not present

## 2020-06-15 DIAGNOSIS — Z45018 Encounter for adjustment and management of other part of cardiac pacemaker: Secondary | ICD-10-CM | POA: Diagnosis not present

## 2020-06-15 DIAGNOSIS — I480 Paroxysmal atrial fibrillation: Secondary | ICD-10-CM | POA: Diagnosis not present

## 2020-06-15 DIAGNOSIS — I509 Heart failure, unspecified: Secondary | ICD-10-CM | POA: Diagnosis not present

## 2020-06-15 DIAGNOSIS — Z8673 Personal history of transient ischemic attack (TIA), and cerebral infarction without residual deficits: Secondary | ICD-10-CM | POA: Diagnosis not present

## 2020-06-15 DIAGNOSIS — I495 Sick sinus syndrome: Secondary | ICD-10-CM | POA: Diagnosis not present

## 2020-06-15 DIAGNOSIS — Z6833 Body mass index (BMI) 33.0-33.9, adult: Secondary | ICD-10-CM | POA: Diagnosis not present

## 2020-06-15 DIAGNOSIS — Z950 Presence of cardiac pacemaker: Secondary | ICD-10-CM | POA: Diagnosis not present

## 2020-07-10 DIAGNOSIS — N1832 Chronic kidney disease, stage 3b: Secondary | ICD-10-CM | POA: Diagnosis not present

## 2020-07-10 DIAGNOSIS — I110 Hypertensive heart disease with heart failure: Secondary | ICD-10-CM | POA: Diagnosis not present

## 2020-07-10 DIAGNOSIS — Z6831 Body mass index (BMI) 31.0-31.9, adult: Secondary | ICD-10-CM | POA: Diagnosis not present

## 2020-07-24 DIAGNOSIS — I5042 Chronic combined systolic (congestive) and diastolic (congestive) heart failure: Secondary | ICD-10-CM | POA: Diagnosis not present

## 2020-07-24 DIAGNOSIS — E669 Obesity, unspecified: Secondary | ICD-10-CM | POA: Diagnosis not present

## 2020-07-24 DIAGNOSIS — Z6832 Body mass index (BMI) 32.0-32.9, adult: Secondary | ICD-10-CM | POA: Diagnosis not present

## 2020-07-24 DIAGNOSIS — N1832 Chronic kidney disease, stage 3b: Secondary | ICD-10-CM | POA: Diagnosis not present

## 2020-07-24 DIAGNOSIS — L659 Nonscarring hair loss, unspecified: Secondary | ICD-10-CM | POA: Diagnosis not present

## 2020-07-24 DIAGNOSIS — I10 Essential (primary) hypertension: Secondary | ICD-10-CM | POA: Diagnosis not present

## 2020-08-16 DIAGNOSIS — Z45018 Encounter for adjustment and management of other part of cardiac pacemaker: Secondary | ICD-10-CM | POA: Diagnosis not present

## 2020-08-16 DIAGNOSIS — I495 Sick sinus syndrome: Secondary | ICD-10-CM | POA: Diagnosis not present

## 2020-10-09 DIAGNOSIS — Z6833 Body mass index (BMI) 33.0-33.9, adult: Secondary | ICD-10-CM | POA: Diagnosis not present

## 2020-10-09 DIAGNOSIS — M79674 Pain in right toe(s): Secondary | ICD-10-CM | POA: Diagnosis not present

## 2020-10-09 DIAGNOSIS — M109 Gout, unspecified: Secondary | ICD-10-CM | POA: Diagnosis not present

## 2020-11-15 DIAGNOSIS — Z45018 Encounter for adjustment and management of other part of cardiac pacemaker: Secondary | ICD-10-CM | POA: Diagnosis not present

## 2020-11-15 DIAGNOSIS — I495 Sick sinus syndrome: Secondary | ICD-10-CM | POA: Diagnosis not present

## 2020-11-30 ENCOUNTER — Ambulatory Visit: Payer: Medicare PPO | Admitting: Cardiology

## 2020-12-14 DIAGNOSIS — Z6833 Body mass index (BMI) 33.0-33.9, adult: Secondary | ICD-10-CM | POA: Diagnosis not present

## 2020-12-14 DIAGNOSIS — I5042 Chronic combined systolic (congestive) and diastolic (congestive) heart failure: Secondary | ICD-10-CM | POA: Diagnosis not present

## 2020-12-14 DIAGNOSIS — N1832 Chronic kidney disease, stage 3b: Secondary | ICD-10-CM | POA: Diagnosis not present

## 2020-12-14 DIAGNOSIS — Z131 Encounter for screening for diabetes mellitus: Secondary | ICD-10-CM | POA: Diagnosis not present

## 2020-12-14 DIAGNOSIS — I4891 Unspecified atrial fibrillation: Secondary | ICD-10-CM | POA: Diagnosis not present

## 2020-12-14 DIAGNOSIS — I110 Hypertensive heart disease with heart failure: Secondary | ICD-10-CM | POA: Diagnosis not present

## 2020-12-14 DIAGNOSIS — Z0000 Encounter for general adult medical examination without abnormal findings: Secondary | ICD-10-CM | POA: Diagnosis not present

## 2020-12-14 DIAGNOSIS — E785 Hyperlipidemia, unspecified: Secondary | ICD-10-CM | POA: Diagnosis not present

## 2020-12-28 DIAGNOSIS — R17 Unspecified jaundice: Secondary | ICD-10-CM | POA: Diagnosis not present

## 2021-01-08 ENCOUNTER — Encounter: Payer: Self-pay | Admitting: Cardiology

## 2021-01-08 ENCOUNTER — Ambulatory Visit: Payer: Medicare PPO | Admitting: Cardiology

## 2021-01-08 ENCOUNTER — Other Ambulatory Visit: Payer: Self-pay

## 2021-01-08 VITALS — BP 168/88 | HR 62 | Ht 60.0 in | Wt 180.2 lb

## 2021-01-08 DIAGNOSIS — I10 Essential (primary) hypertension: Secondary | ICD-10-CM | POA: Diagnosis not present

## 2021-01-08 DIAGNOSIS — E669 Obesity, unspecified: Secondary | ICD-10-CM | POA: Diagnosis not present

## 2021-01-08 DIAGNOSIS — Z9889 Other specified postprocedural states: Secondary | ICD-10-CM | POA: Diagnosis not present

## 2021-01-08 DIAGNOSIS — Z950 Presence of cardiac pacemaker: Secondary | ICD-10-CM | POA: Diagnosis not present

## 2021-01-08 DIAGNOSIS — I480 Paroxysmal atrial fibrillation: Secondary | ICD-10-CM | POA: Diagnosis not present

## 2021-01-08 HISTORY — DX: Obesity, unspecified: E66.9

## 2021-01-08 NOTE — Progress Notes (Signed)
Cardiology Office Note:    Date:  01/08/2021   ID:  Isabel Hardy, DOB Apr 28, 1950, MRN [id number]  PCP:  Buckner Malta, MD  Cardiologist:  Garwin Brothers, MD   Referring MD: Buckner Malta, MD    ASSESSMENT:    1. Essential hypertension   2. Paroxysmal A-fib (HCC)   3. S/P MVR (mitral valve repair)   4. Presence of permanent cardiac pacemaker   5. H/O mitral valve repair   6. Obesity (BMI 35.0-39.9 without comorbidity)    PLAN:    In order of problems listed above:  Primary prevention stressed with the patient.  Importance of compliance with diet medication stressed and she vocalized understanding.  She was advised to walk on a regular basis to the best of her ability and lose weight. Atrial fibrillation:I discussed with the patient atrial fibrillation, disease process. Management and therapy including rate and rhythm control, anticoagulation benefits and potential risks were discussed extensively with the patient. Patient had multiple questions which were answered to patient's satisfaction. Essential hypertension: Blood pressure stable and diet was emphasized.  She has an element of whitecoat hypertension.  Her blood pressures at home are fine.  She told me the numbers. Diabetes mellitus, mixed dyslipidemia, daily insufficiency and obesity: Lifestyle modification urged risks of these issues were discussed with the patient and questions were answered to her satisfaction. Patient will be seen in follow-up appointment in 6 months or earlier if the patient has any concerns.  I reviewed blood work available on the [employer] with the patient.   Medication Adjustments/Labs and Tests Ordered: Current medicines are reviewed at length with the patient today.  Concerns regarding medicines are outlined above.  No orders of the defined types were placed in this encounter.  No orders of the defined types were placed in this encounter.    No chief complaint on file.     History of Present Illness:    Isabel Hardy is a 70 y.o. female.  Patient has past medical history of atrial fibrillation, anemia insufficiency, essential hypertension post mitral valve repair and atrial fibrillation.  She denies any problems at this time and takes care of activities of daily living.  No chest pain orthopnea or PND.  She has a permanent cardiac pacemaker.  Unfortunately she has not done well with losing weight.  Past Medical History:  Diagnosis Date   Acute renal failure (HCC)    Atherosclerosis of aorta (HCC) 04/02/2020   ATN (acute tubular necrosis) (HCC) 04/02/2020   Atrial fibrillation (HCC)    Atrial flutter (HCC) 10/03/2015   Atrophic kidney, Right 04/02/2020   04/01/20 Renal US   COVID-19 virus infection 04/01/2020   Dyslipidemia 11/23/2014   Dysrhythmia    Essential hypertension 11/23/2014   H/O mitral valve repair    Hematuria 04/02/2020   Hx of Grade III diastolic dysfunction 10/30/2017   TTE 10/30/2017   Hypertension    Hypovolemia associated with vomiting 04/02/2020   Hypovolemic Hypotension 04/02/2020   Paroxysmal A-fib (HCC) 10/03/2015   Added automatically from request for surgery [id number]  Formatting of this note might be different from the original. Added automatically from request for surgery [id number]   Pneumonia due to COVID-19 virus 04/02/2020   Presence of permanent cardiac pacemaker    Pyuria 04/02/2020   S/P MVR (mitral valve repair) 10/03/2015   Sinus node dysfunction (HCC) 09/24/2016   Somnolence 04/02/2020   Spinal stenosis of lumbar region with neurogenic claudication 01/19/2019   Stroke (HCC)  per patient, "mini stroke in 2005/06"    Past Surgical History:  Procedure Laterality Date   ABDOMINAL HYSTERECTOMY     APPENDECTOMY     CATARACT EXTRACTION W/ INTRAOCULAR LENS IMPLANT Left    CESAREAN SECTION     CHOLECYSTECTOMY     EYE SURGERY     GALLBLADDER SURGERY     LEFT ATRIAL APPENDAGE OCCLUSION     Left Appendage Ligation at [facility]    LUMBAR LAMINECTOMY/DECOMPRESSION MICRODISCECTOMY N/A 01/19/2019   Procedure: LUMBAR LAMINECTOMY AND FORAMINOTOMY LUMBAR THREE- LUMBAR FOUR, LUMBAR FOUR- LUMBAR FIVE;  Surgeon: Tressie Stalker, MD;  Location: [facility] OR;  Service: Neurosurgery;  Laterality: N/A;  LUMBAR LAMINECTOMY AND FORAMINOTOMY LUMBAR THREE- LUMBAR FOUR, LUMBAR FOUR- LUMBAR FIVE   MAZE     [facility]   MITRAL VALVE ANNULOPLASTY     [facility]   open heart surgery     TONSILLECTOMY      Current Medications: Current Meds  Medication Sig   acetaminophen (TYLENOL) 650 MG CR tablet Take 650 mg by mouth every 8 (eight) hours as needed for pain.   alendronate (FOSAMAX) 70 MG tablet Take 70 mg by mouth once a week. Take with a full glass of water on an empty stomach.   ALPRAZolam (XANAX) 0.5 MG tablet Take 0.5 mg by mouth at bedtime as needed for anxiety or sleep.   apixaban (ELIQUIS) 5 MG TABS tablet Take 5 mg by mouth 2 (two) times a day.    Ascorbic Acid (VITAMIN C) 1000 MG tablet Take 1,000 mg by mouth daily.   atorvastatin (LIPITOR) 40 MG tablet Take 40 mg by mouth every evening.    Calcium Carbonate-Vit D-Min (CALCIUM 1200) 1200-1000 MG-UNIT CHEW Chew 1 tablet by mouth daily.   cholecalciferol (VITAMIN D3) 25 MCG (1000 UNIT) tablet Take 1,000 Units by mouth daily.   dapagliflozin propanediol (FARXIGA) 10 MG TABS tablet Take 10 mg by mouth daily.   dronedarone (MULTAQ) 400 MG tablet Take 400 mg by mouth 2 (two) times daily with a meal.   furosemide (LASIX) 20 MG tablet Take 20 mg by mouth daily in the afternoon.   metoprolol tartrate (LOPRESSOR) 25 MG tablet Take 1 tablet (25 mg total) by mouth 2 (two) times daily.   telmisartan (MICARDIS) 80 MG tablet Take 80 mg by mouth daily.   traZODone (DESYREL) 50 MG tablet Take 50 mg by mouth at bedtime as needed for sleep.     Allergies:   Patient has no known allergies.   Social History   Socioeconomic History   Marital status: Married    Spouse name: Not on file   Number of children:  Not on file   Years of education: Not on file   Highest education level: Not on file  Occupational History   Not on file  Tobacco Use   Smoking status: Never   Smokeless tobacco: Never  Vaping Use   Vaping Use: Never used  Substance and Sexual Activity   Alcohol use: No   Drug use: No   Sexual activity: Not on file  Other Topics Concern   Not on file  Social History Narrative   Not on file   Social Determinants of Health   Financial Resource Strain: Not on file  Food Insecurity: Not on file  Transportation Needs: Not on file  Physical Activity: Not on file  Stress: Not on file  Social Connections: Not on file     Family History: The patient's family history includes Heart attack in  her father; Hypertension in her mother; Kidney disease in her mother.  ROS:   Please see the history of present illness.    All other systems reviewed and are negative.  EKGs/Labs/Other Studies Reviewed:    The following studies were reviewed today: I discussed my findings with the patient at length   Recent Labs: 04/05/2020: Magnesium 1.5 05/25/2020: ALT 25; BUN 15; Creatinine, Ser 1.23; Hemoglobin 12.9; Platelets 246; Potassium 3.5; Sodium 147; TSH 1.740  Recent Lipid Panel    Component Value Date/Time   CHOL 151 12/30/2016 1155   TRIG 228 (H) 04/01/2020 1046   HDL 55 12/30/2016 1155   CHOLHDL 2.7 12/30/2016 1155   LDLCALC 76 12/30/2016 1155    Physical Exam:    VS:  BP (!) 168/88   Pulse 62   Ht 5' (1.524 m)   Wt 180 lb 3.2 oz (81.7 kg)   SpO2 97%   BMI 35.19 kg/m     Wt Readings from Last 3 Encounters:  01/08/21 180 lb 3.2 oz (81.7 kg)  05/25/20 163 lb 3.2 oz (74 kg)  04/05/20 165 lb 1.6 oz (74.9 kg)     GEN: Patient is in no acute distress HEENT: Normal NECK: No JVD; No carotid bruits LYMPHATICS: No lymphadenopathy CARDIAC: Hear sounds regular, 2/6 systolic murmur at the apex. RESPIRATORY:  Clear to auscultation without rales, wheezing or rhonchi  ABDOMEN:  Soft, non-tender, non-distended MUSCULOSKELETAL:  No edema; No deformity  SKIN: Warm and dry NEUROLOGIC:  Alert and oriented x 3 PSYCHIATRIC:  Normal affect   Signed, Garwin Brothers, MD  01/08/2021 3:56 PM    [facility]

## 2021-01-08 NOTE — Patient Instructions (Signed)

## 2021-01-24 DIAGNOSIS — H25812 Combined forms of age-related cataract, left eye: Secondary | ICD-10-CM | POA: Diagnosis not present

## 2021-02-14 DIAGNOSIS — Z45018 Encounter for adjustment and management of other part of cardiac pacemaker: Secondary | ICD-10-CM | POA: Diagnosis not present

## 2021-03-18 DIAGNOSIS — Z6834 Body mass index (BMI) 34.0-34.9, adult: Secondary | ICD-10-CM | POA: Diagnosis not present

## 2021-03-18 DIAGNOSIS — I10 Essential (primary) hypertension: Secondary | ICD-10-CM | POA: Diagnosis not present

## 2021-03-18 DIAGNOSIS — N1832 Chronic kidney disease, stage 3b: Secondary | ICD-10-CM | POA: Diagnosis not present

## 2021-03-18 DIAGNOSIS — R17 Unspecified jaundice: Secondary | ICD-10-CM | POA: Diagnosis not present

## 2021-04-16 DIAGNOSIS — E876 Hypokalemia: Secondary | ICD-10-CM | POA: Diagnosis not present

## 2021-04-16 DIAGNOSIS — Z23 Encounter for immunization: Secondary | ICD-10-CM | POA: Diagnosis not present

## 2021-04-19 IMAGING — CT CT ABD-PELV W/O CM
2 of 4 series · 16 of 46 positions shown, 18 images · non-contrast
Comparison: CT abdomen pelvis 07/30/2016

CLINICAL DATA: Kidney failure

EXAM:
CT ABDOMEN AND PELVIS WITHOUT CONTRAST
TECHNIQUE: Multidetector CT imaging of the abdomen and pelvis was performed
following the standard protocol without IV contrast.

[Series 2: ap without · axial · non-contrast · 0.67mm/px · z∈[-470,-80]mm · 13 of 90 slices shown, 15 images]
[im 6/90  soft-tissue]
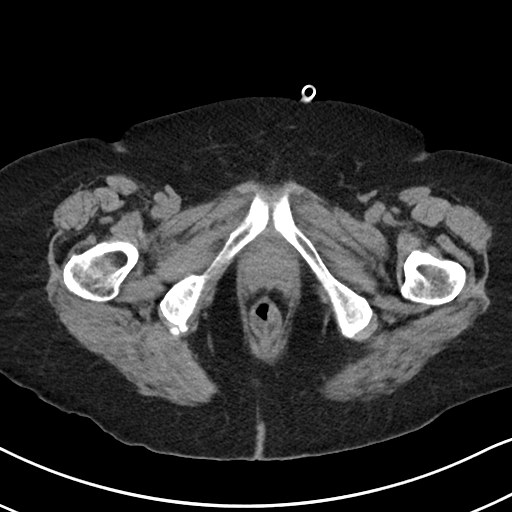
[im 6/90  bone]
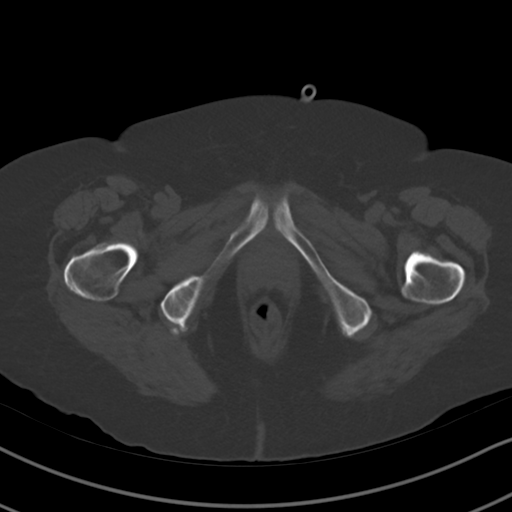
[im 12/90  soft-tissue]
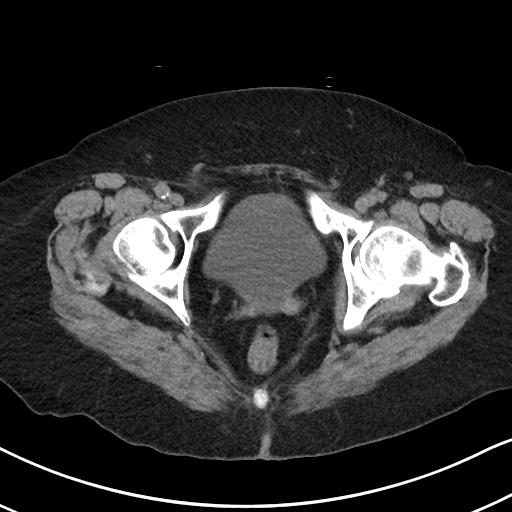
[im 17/90  soft-tissue]
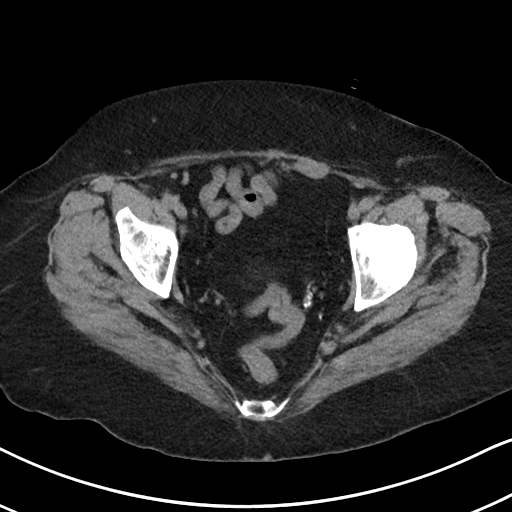
[im 28/90  soft-tissue]
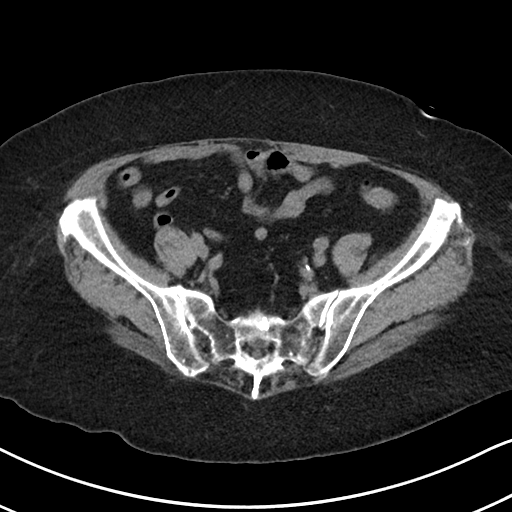
[im 34/90  soft-tissue]
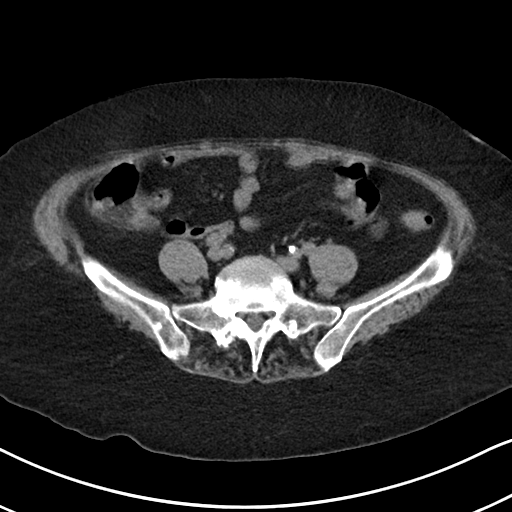
[im 39/90  soft-tissue]
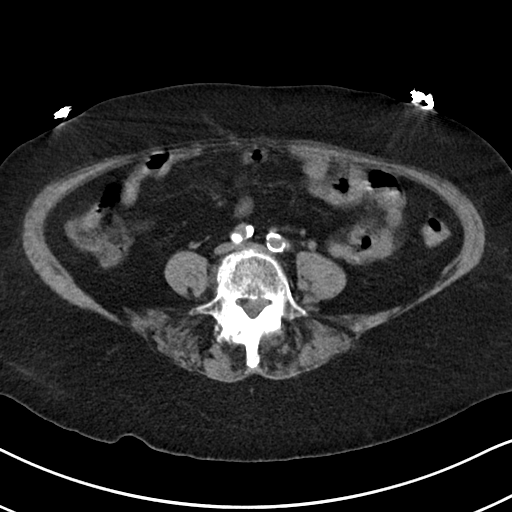
[im 45/90  soft-tissue]
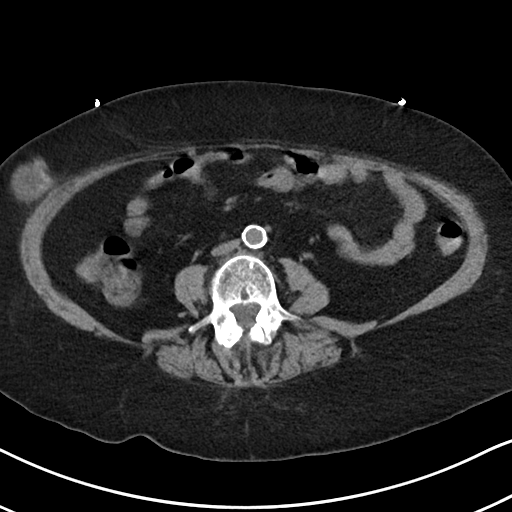
[im 51/90  soft-tissue]
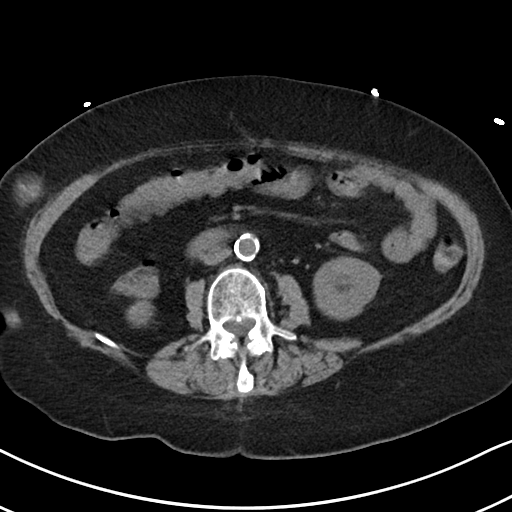
[im 56/90  soft-tissue]
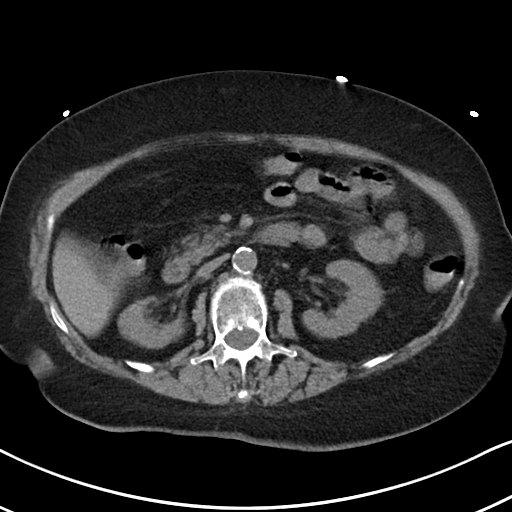
[im 56/90  bone]
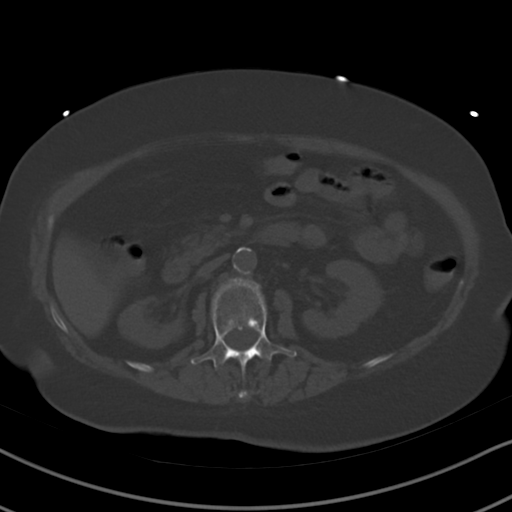
[im 62/90  soft-tissue]
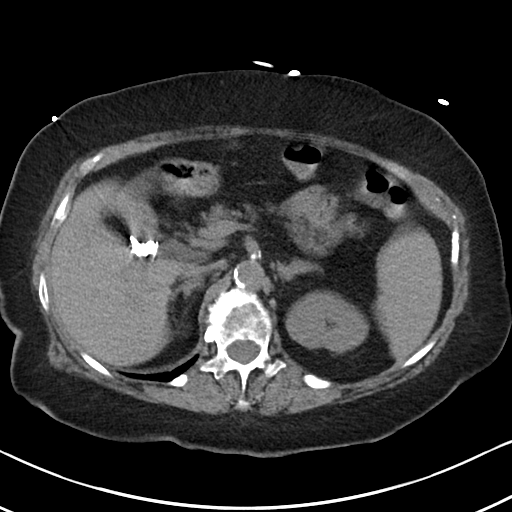
[im 73/90  soft-tissue]
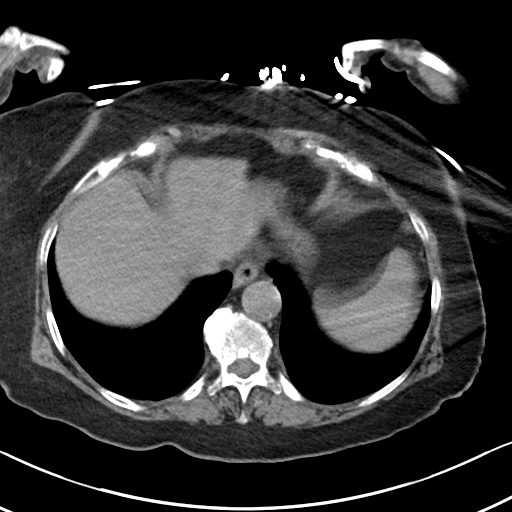
[im 78/90  soft-tissue]
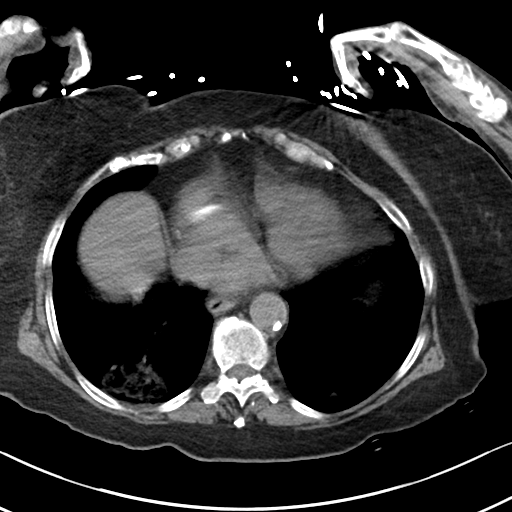
[im 84/90  soft-tissue]
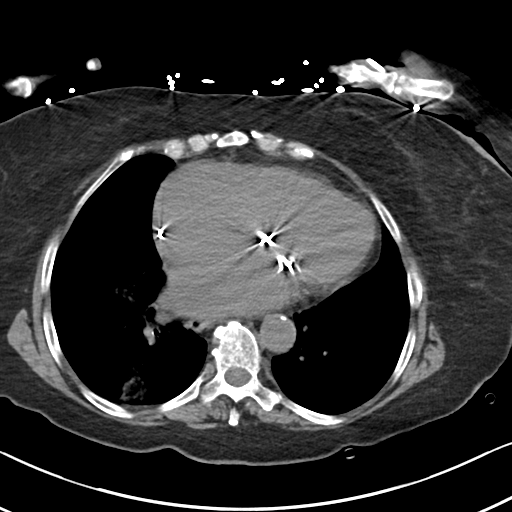

[Series 5: cor · coronal · 0.73mm/px · 3 of 87 slices shown]
[im 29/87  soft-tissue]
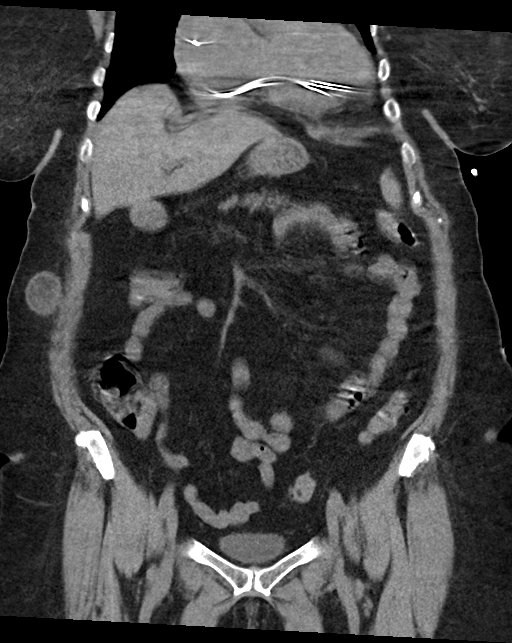
[im 39/87  soft-tissue]
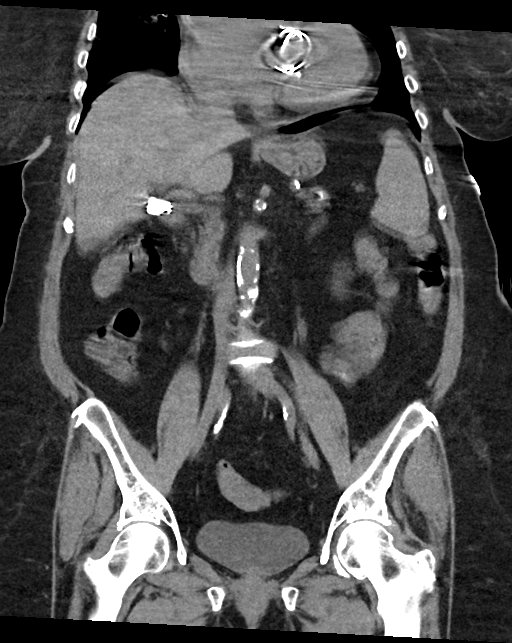
[im 48/87  soft-tissue]
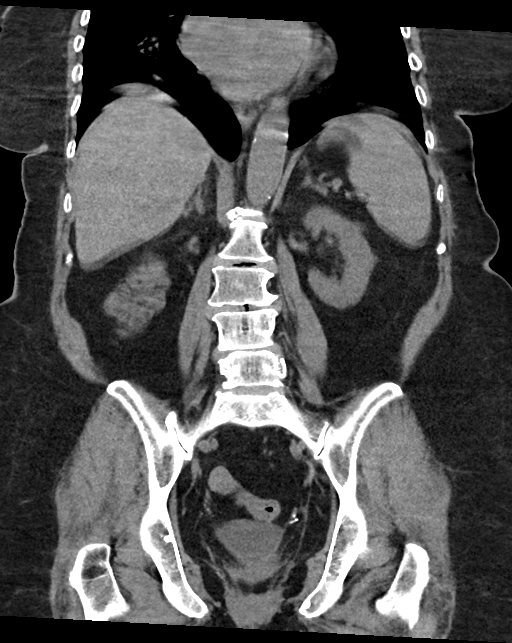

[16 of 46 positions shown; findings below may reference images not displayed]

FINDINGS: Lower chest: Motion limited. Multifocal opacities in the bilateral
lung bases. Enlarged heart size.

Evaluation of the abdominal viscera limited by the lack of IV
contrast.

Hepatobiliary: No focal liver abnormality is seen. Status post
cholecystectomy. 
Pancreas: Fatty replaced.  No surrounding inflammatory change.

Spleen: Normal in size without focal abnormality.

Adrenals/Urinary Tract: Adrenal glands are unremarkable. No
hydronephrosis. No renal calculi. Atrophic right kidney. No large
renal mass. Urinary bladder is unremarkable.

Stomach/Bowel: Stomach is within normal limits. No evidence of bowel
wall thickening, distention, or inflammatory changes.

Vascular/Lymphatic: Aortic atherosclerosis. Vascular patency cannot
be assessed in the absence of IV contrast. No enlarged abdominal or
pelvic lymph nodes.

Reproductive: Status post hysterectomy. No adnexal masses.

Other: No abdominal wall hernia. 3.7 cm soft tissue density in the
lateral right anterior abdominal wall is unchanged.

Musculoskeletal: Interval increase in size of several sclerotic foci
in the inferior left pubic ramus, right acetabulum and right sacrum,
consistent with benign enostoses. Other scattered sclerotic foci are
not significantly changed. Degenerative changes throughout the
thoracolumbar spine.
IMPRESSION: 1. Multifocal opacities in the visualized bilateral lung bases,
concerning for bilateral pneumonia.
2. Atrophic right kidney. No renal calculi or hydronephrosis.
3. Aortic atherosclerosis.

Aortic Atherosclerosis ([id number]-WGG.G).

## 2021-04-29 DIAGNOSIS — I129 Hypertensive chronic kidney disease with stage 1 through stage 4 chronic kidney disease, or unspecified chronic kidney disease: Secondary | ICD-10-CM | POA: Diagnosis not present

## 2021-04-29 DIAGNOSIS — N2581 Secondary hyperparathyroidism of renal origin: Secondary | ICD-10-CM | POA: Diagnosis not present

## 2021-04-29 DIAGNOSIS — Z8616 Personal history of COVID-19: Secondary | ICD-10-CM | POA: Diagnosis not present

## 2021-04-29 DIAGNOSIS — N1832 Chronic kidney disease, stage 3b: Secondary | ICD-10-CM | POA: Diagnosis not present

## 2021-04-29 DIAGNOSIS — D631 Anemia in chronic kidney disease: Secondary | ICD-10-CM | POA: Diagnosis not present

## 2021-04-29 DIAGNOSIS — N390 Urinary tract infection, site not specified: Secondary | ICD-10-CM | POA: Diagnosis not present

## 2021-04-29 DIAGNOSIS — N189 Chronic kidney disease, unspecified: Secondary | ICD-10-CM | POA: Diagnosis not present

## 2021-05-16 DIAGNOSIS — Z45018 Encounter for adjustment and management of other part of cardiac pacemaker: Secondary | ICD-10-CM | POA: Diagnosis not present

## 2021-05-24 DIAGNOSIS — R0981 Nasal congestion: Secondary | ICD-10-CM | POA: Diagnosis not present

## 2021-05-24 DIAGNOSIS — R051 Acute cough: Secondary | ICD-10-CM | POA: Diagnosis not present

## 2021-05-24 DIAGNOSIS — Z20828 Contact with and (suspected) exposure to other viral communicable diseases: Secondary | ICD-10-CM | POA: Diagnosis not present

## 2021-05-24 DIAGNOSIS — Z6834 Body mass index (BMI) 34.0-34.9, adult: Secondary | ICD-10-CM | POA: Diagnosis not present

## 2021-06-17 DIAGNOSIS — I5042 Chronic combined systolic (congestive) and diastolic (congestive) heart failure: Secondary | ICD-10-CM | POA: Diagnosis not present

## 2021-06-17 DIAGNOSIS — E559 Vitamin D deficiency, unspecified: Secondary | ICD-10-CM | POA: Diagnosis not present

## 2021-06-17 DIAGNOSIS — Z6834 Body mass index (BMI) 34.0-34.9, adult: Secondary | ICD-10-CM | POA: Diagnosis not present

## 2021-06-17 DIAGNOSIS — I4891 Unspecified atrial fibrillation: Secondary | ICD-10-CM | POA: Diagnosis not present

## 2021-06-17 DIAGNOSIS — N1832 Chronic kidney disease, stage 3b: Secondary | ICD-10-CM | POA: Diagnosis not present

## 2021-06-24 DIAGNOSIS — I129 Hypertensive chronic kidney disease with stage 1 through stage 4 chronic kidney disease, or unspecified chronic kidney disease: Secondary | ICD-10-CM | POA: Diagnosis not present

## 2021-06-24 DIAGNOSIS — N1832 Chronic kidney disease, stage 3b: Secondary | ICD-10-CM | POA: Diagnosis not present

## 2021-06-24 DIAGNOSIS — D631 Anemia in chronic kidney disease: Secondary | ICD-10-CM | POA: Diagnosis not present

## 2021-06-24 DIAGNOSIS — N2581 Secondary hyperparathyroidism of renal origin: Secondary | ICD-10-CM | POA: Diagnosis not present

## 2021-06-28 DIAGNOSIS — I4892 Unspecified atrial flutter: Secondary | ICD-10-CM | POA: Diagnosis not present

## 2021-06-28 DIAGNOSIS — I472 Ventricular tachycardia, unspecified: Secondary | ICD-10-CM | POA: Diagnosis not present

## 2021-06-28 DIAGNOSIS — I509 Heart failure, unspecified: Secondary | ICD-10-CM | POA: Diagnosis not present

## 2021-06-28 DIAGNOSIS — Z6835 Body mass index (BMI) 35.0-35.9, adult: Secondary | ICD-10-CM | POA: Diagnosis not present

## 2021-06-28 DIAGNOSIS — Z45018 Encounter for adjustment and management of other part of cardiac pacemaker: Secondary | ICD-10-CM | POA: Diagnosis not present

## 2021-06-28 DIAGNOSIS — E785 Hyperlipidemia, unspecified: Secondary | ICD-10-CM | POA: Diagnosis not present

## 2021-06-28 DIAGNOSIS — Z7901 Long term (current) use of anticoagulants: Secondary | ICD-10-CM | POA: Diagnosis not present

## 2021-06-28 DIAGNOSIS — I110 Hypertensive heart disease with heart failure: Secondary | ICD-10-CM | POA: Diagnosis not present

## 2021-06-28 DIAGNOSIS — I480 Paroxysmal atrial fibrillation: Secondary | ICD-10-CM | POA: Diagnosis not present

## 2021-06-28 DIAGNOSIS — I495 Sick sinus syndrome: Secondary | ICD-10-CM | POA: Diagnosis not present

## 2021-06-28 DIAGNOSIS — Z950 Presence of cardiac pacemaker: Secondary | ICD-10-CM | POA: Diagnosis not present

## 2021-07-01 DIAGNOSIS — R9431 Abnormal electrocardiogram [ECG] [EKG]: Secondary | ICD-10-CM | POA: Diagnosis not present

## 2021-07-01 DIAGNOSIS — Z950 Presence of cardiac pacemaker: Secondary | ICD-10-CM | POA: Diagnosis not present

## 2021-07-01 DIAGNOSIS — I517 Cardiomegaly: Secondary | ICD-10-CM | POA: Diagnosis not present

## 2021-07-15 ENCOUNTER — Ambulatory Visit: Payer: Medicare PPO | Admitting: Cardiology

## 2021-07-15 ENCOUNTER — Encounter: Payer: Self-pay | Admitting: Cardiology

## 2021-07-15 ENCOUNTER — Other Ambulatory Visit: Payer: Self-pay

## 2021-07-15 VITALS — BP 136/92 | HR 91 | Ht 60.0 in | Wt 178.8 lb

## 2021-07-15 DIAGNOSIS — I10 Essential (primary) hypertension: Secondary | ICD-10-CM

## 2021-07-15 DIAGNOSIS — Z950 Presence of cardiac pacemaker: Secondary | ICD-10-CM

## 2021-07-15 DIAGNOSIS — Z9889 Other specified postprocedural states: Secondary | ICD-10-CM

## 2021-07-15 DIAGNOSIS — R0609 Other forms of dyspnea: Secondary | ICD-10-CM | POA: Diagnosis not present

## 2021-07-15 DIAGNOSIS — I480 Paroxysmal atrial fibrillation: Secondary | ICD-10-CM

## 2021-07-15 DIAGNOSIS — I700 Atherosclerosis of aorta: Secondary | ICD-10-CM

## 2021-07-15 NOTE — Addendum Note (Signed)
Addended by: Belva Crome R on: 07/15/2021 09:16 AM ? ? Modules accepted: Orders ? ?

## 2021-07-15 NOTE — Addendum Note (Signed)
Addended by: Eleonore Chiquito on: 07/15/2021 08:58 AM ? ? Modules accepted: Orders ? ?

## 2021-07-15 NOTE — Patient Instructions (Signed)
Medication Instructions:  ?Your physician recommends that you continue on your current medications as directed. Please refer to the Current Medication list given to you today. ? ?*If you need a refill on your cardiac medications before your next appointment, please call your pharmacy* ? ? ?Lab Work: ?None ordered ?If you have labs (blood work) drawn today and your tests are completely normal, you will receive your results only by: ?MyChart Message (if you have MyChart) OR ?A paper copy in the mail ?If you have any lab test that is abnormal or we need to change your treatment, we will call you to review the results. ? ? ?Testing/Procedures: ?Your physician has requested that you have a lexiscan myoview. For further information please visit [email or web address] Please follow instruction sheet, as given. ? ?The test will take approximately 3 to 4 hours to complete; you may bring reading material.  If someone comes with you to your appointment, they will need to remain in the main lobby due to limited space in the testing area.  ? ?How to prepare for your Myocardial Perfusion Test: ?Do not eat or drink 3 hours prior to your test, except you may have water. ?Do not consume products containing caffeine (regular or decaffeinated) 12 hours prior to your test. (ex: coffee, chocolate, sodas, tea). ?Do bring a list of your current medications with you.  If not listed below, you may take your medications as normal. ?Do wear comfortable clothes (no dresses or overalls) and walking shoes, tennis shoes preferred (No heels or open toe shoes are allowed). ?Do NOT wear cologne, perfume, aftershave, or lotions (deodorant is allowed). ?If these instructions are not followed, your test will have to be rescheduled. ? ?Your physician has requested that you have an echocardiogram. Echocardiography is a painless test that uses sound waves to create images of your heart. It provides your doctor with information about the size and shape of  your heart and how well your heart?s chambers and valves are working. This procedure takes approximately one hour. There are no restrictions for this procedure. ? ? ?Follow-Up: ?At [facility], you and your health needs are our priority.  As part of our continuing mission to provide you with exceptional heart care, we have created designated Provider Care Teams.  These Care Teams include your primary Cardiologist (physician) and Advanced Practice Providers (APPs -  Physician Assistants and Nurse Practitioners) who all work together to provide you with the care you need, when you need it. ? ?We recommend signing up for the patient portal called "MyChart".  Sign up information is provided on this After Visit Summary.  MyChart is used to connect with patients for Virtual Visits (Telemedicine).  Patients are able to view lab/test results, encounter notes, upcoming appointments, etc.  Non-urgent messages can be sent to your provider as well.   ?To learn more about what you can do with MyChart, go to [email or web address].   ? ?Your next appointment:   ?1 month(s) ? ?The format for your next appointment:   ?In Person ? ?Provider:   ?Belva Crome, MD ? ? ?Other Instructions ?Cardiac Nuclear Scan ?A cardiac nuclear scan is a test that is done to check the flow of blood to your heart. It is done when you are resting and when you are exercising. The test looks for problems such as: ?Not enough blood reaching a portion of the heart. ?The heart muscle not working as it should. ?You may need this test if: ?You have heart disease. ?You have  had lab results that are not normal. ?You have had heart surgery or a balloon procedure to open up blocked arteries (angioplasty). ?You have chest pain. ?You have shortness of breath. ?In this test, a special dye (tracer) is put into your bloodstream. The tracer will travel to your heart. A camera will then take pictures of your heart to see how the tracer moves through your heart. This  test is usually done at a hospital and takes 2-4 hours. ?Tell a doctor about: ?Any allergies you have. ?All medicines you are taking, including vitamins, herbs, eye drops, creams, and over-the-counter medicines. ?Any problems you or family members have had with anesthetic medicines. ?Any blood disorders you have. ?Any surgeries you have had. ?Any medical conditions you have. ?Whether you are pregnant or may be pregnant. ?What are the risks? ?Generally, this is a safe test. However, problems may occur, such as: ?Serious chest pain and heart attack. This is only a risk if the stress portion of the test is done. ?Rapid heartbeat. ?A feeling of warmth in your chest. This feeling usually does not last long. ?Allergic reaction to the tracer. ?What happens before the test? ?Ask your doctor about changing or stopping your normal medicines. This is important. ?Follow instructions from your doctor about what you cannot eat or drink. ?Remove your jewelry on the day of the test. ?What happens during the test? ?An IV tube will be inserted into one of your veins. ?Your doctor will give you a small amount of tracer through the IV tube. ?You will wait for 20-40 minutes while the tracer moves through your bloodstream. ?Your heart will be monitored with an electrocardiogram (ECG). ?You will lie down on an exam table. ?Pictures of your heart will be taken for about 15-20 minutes. ?You may also have a stress test. For this test, one of these things may be done: ?You will be asked to exercise on a treadmill or a stationary bike. ?You will be given medicines that will make your heart work harder. This is done if you are unable to exercise. ?When blood flow to your heart has peaked, a tracer will again be given through the IV tube. ?After 20-40 minutes, you will get back on the exam table. More pictures will be taken of your heart. ?Depending on the tracer that is used, more pictures may need to be taken 3-4 hours later. ?Your IV tube  will be removed when the test is over. ?The test may vary among doctors and hospitals. ?What happens after the test? ?Ask your doctor: ?Whether you can return to your normal schedule, including diet, activities, and medicines. ?Whether you should drink more fluids. This will help to remove the tracer from your body. Drink enough fluid to keep your pee (urine) pale yellow. ?Ask your doctor, or the department that is doing the test: ?When will my results be ready? ?How will I get my results? ?Summary ?A cardiac nuclear scan is a test that is done to check the flow of blood to your heart. ?Tell your doctor whether you are pregnant or may be pregnant. ?Before the test, ask your doctor about changing or stopping your normal medicines. This is important. ?Ask your doctor whether you can return to your normal activities. You may be asked to drink more fluids. ?This information is not intended to replace advice given to you by your health care provider. Make sure you discuss any questions you have with your health care provider. ?Document Revised: 07/28/2018 Document Reviewed: 09/21/2017 ?  Elsevier Patient Education ? 2021 Elsevier Inc. ? ? ? ?Echocardiogram ?An echocardiogram is a test that uses sound waves (ultrasound) to produce images of the heart. ?Images from an echocardiogram can provide important information about: ?Heart size and shape. ?The size and thickness and movement of your heart's walls. ?Heart muscle function and strength. ?Heart valve function or if you have stenosis. Stenosis is when the heart valves are too narrow. ?If blood is flowing backward through the heart valves (regurgitation). ?A tumor or infectious growth around the heart valves. ?Areas of heart muscle that are not working well because of poor blood flow or injury from a heart attack. ?Aneurysm detection. An aneurysm is a weak or damaged part of an artery wall. The wall bulges out from the normal force of blood pumping through the body. ?Tell a  health care provider about: ?Any allergies you have. ?All medicines you are taking, including vitamins, herbs, eye drops, creams, and over-the-counter medicines. ?Any blood disorders you have. ?Any surge

## 2021-07-15 NOTE — Progress Notes (Signed)
?Cardiology Office Note:   ? ?Date:  07/15/2021  ? ?ID:  [name], DOB [birth year], MRN [id number] ? ?PCP:  Serita Grammes, MD  ?Cardiologist:  Jenean Lindau, MD  ? ?Referring MD: Serita Grammes, MD  ? ? ?ASSESSMENT:   ? ?1. Essential hypertension   ?2. Paroxysmal A-fib ([place])   ?3. Atherosclerosis of aorta ([place])   ?4. S/P MVR (mitral valve repair)   ?5. Presence of permanent cardiac pacemaker   ? ?PLAN:   ? ?In order of problems listed above: ? ?Paroxysmal atrial fibrillation: I reviewed atrial fibrillation records from pacemaker evaluation.I discussed with the patient atrial fibrillation, disease process. Management and therapy including rate and rhythm control, anticoagulation benefits and potential risks were discussed extensively with the patient. Patient had multiple questions which were answered to patient's satisfaction.  I am a little concerned that the patient is in atrial fibrillation today and therefore I would like to switch to other medications such as flecainide at this point.  However I will get an echocardiogram and Lexiscan sestamibi as the patient also gives history of some dyspnea on exertion.  Patient is agreeable.  If the Lexiscan sestamibi is negative I will consider using flecainide. ?Essential hypertension: Blood pressure stable and diet was emphasized.  Lifestyle modification urged. ?Post mitral valve repair: Echocardiogram will be done to assess the status of the repair and cardiac anatomy. ?Mixed dyslipidemia: Diet was emphasized and lifestyle modification urged.  Weight reduction stressed.  She cannot exercise much because of orthopedic issues involving the back. ?Patient will be seen in follow-up appointment in 4 weeks or earlier if the patient has any concerns ? ? ? ?Medication Adjustments/Labs and Tests Ordered: ?Current medicines are reviewed at length with the patient today.  Concerns regarding medicines are outlined above.  ?No orders of the defined types were placed  in this encounter. ? ?No orders of the defined types were placed in this encounter. ? ? ? ?No chief complaint on file. ?  ? ?History of Present Illness:   ? ?Isabel Hardy is a 71 y.o. female.  Patient has past medical history of aortic atherosclerosis, paroxysmal atrial fibrillation, mitral valve repair, essential hypertension and dyslipidemia.  She denies any problems at this time and takes care of activities of daily living.  No chest pain orthopnea or PND.  At the time of my evaluation, the patient is alert awake oriented and in no distress.  She has a permanent pacemaker. ? ?Past Medical History:  ?Diagnosis Date  ? Acute renal failure ([place])   ? Atherosclerosis of aorta ([place]) 04/02/2020  ? ATN (acute tubular necrosis) ([place]) 04/02/2020  ? Atrial fibrillation ([place])   ? Atrial flutter ([place]) 10/03/2015  ? 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 04/01/20 Renal US  ? COVID-19 virus infection 04/01/2020  ? Dyslipidemia 11/23/2014  ? Dysrhythmia   ? Essential hypertension 11/23/2014  ? H/O mitral valve repair   ? Hematuria 04/02/2020  ? Hip pain 12/31/2018  ? Hx of Grade III diastolic dysfunction 123456  ? TTE 10/30/2017  ? Hypertension   ? Hypovolemia associated with vomiting 04/02/2020  ? Hypovolemic Hypotension 04/02/2020  ? Obesity (BMI 35.0-39.9 without comorbidity) 01/08/2021  ? Paroxysmal A-fib ([place]) 10/03/2015  ? Added automatically from request for surgery [id number]  Formatting of this note might be different from the original. Added automatically from request for surgery [id number]  ? Pneumonia due to COVID-19 virus 04/02/2020  ? Presence of permanent cardiac pacemaker   ? Pyuria 04/02/2020  ?  S/P MVR (mitral valve repair) 10/03/2015  ? Sinus node dysfunction ([place]) 09/24/2016  ? Somnolence 04/02/2020  ? 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 Stroke [facility])   ? per patient, "mini stroke in 2005/06"  ? ? ?Past Surgical History:  ?Procedure Laterality Date  ? ABDOMINAL HYSTERECTOMY    ?  APPENDECTOMY    ? CATARACT EXTRACTION W/ INTRAOCULAR LENS IMPLANT Left   ? CESAREAN SECTION    ? CHOLECYSTECTOMY    ? EYE SURGERY    ? GALLBLADDER SURGERY    ? LEFT ATRIAL APPENDAGE OCCLUSION    ? Left Appendage Ligation at [facility]  ? LUMBAR LAMINECTOMY/DECOMPRESSION MICRODISCECTOMY N/A 01/19/2019  ? Procedure: LUMBAR LAMINECTOMY AND FORAMINOTOMY LUMBAR THREE- LUMBAR FOUR, LUMBAR FOUR- LUMBAR FIVE;  Surgeon: Newman Pies, MD;  Location: [facility];  Service: Neurosurgery;  Laterality: N/A;  LUMBAR LAMINECTOMY AND FORAMINOTOMY LUMBAR THREE- LUMBAR FOUR, LUMBAR FOUR- LUMBAR FIVE  ? MAZE    ? Brodn[place]  ? MITRAL VALVE ANNULOPLASTY    ? [facility]  ? open heart surgery    ? TONSILLECTOMY    ? ? ?Current Medications: ?Current Meds  ?Medication Sig  ? acetaminophen (TYLENOL) 650 MG CR tablet Take 650 mg by mouth every 8 (eight) hours as needed for pain.  ? alendronate (FOSAMAX) 70 MG tablet Take 70 mg by mouth once a week. Take with a full glass of water on an empty stomach.  ? ALPRAZolam (XANAX) 0.5 MG tablet Take 0.5 mg by mouth at bedtime as needed for anxiety or sleep.  ? apixaban (ELIQUIS) 5 MG TABS tablet Take 5 mg by mouth 2 (two) times a day.   ? Ascorbic Acid (VITAMIN C) 1000 MG tablet Take 1,000 mg by mouth daily.  ? atorvastatin (LIPITOR) 40 MG tablet Take 40 mg by mouth every evening.   ? Calcium Carbonate-Vit D-Min (CALCIUM 1200) 1200-1000 MG-UNIT CHEW Chew 1 tablet by mouth daily.  ? cholecalciferol (VITAMIN D3) 25 MCG (1000 UNIT) tablet Take 1,000 Units by mouth daily.  ? dapagliflozin propanediol (FARXIGA) 10 MG TABS tablet Take 10 mg by mouth daily.  ? dronedarone (MULTAQ) 400 MG tablet Take 400 mg by mouth 2 (two) times daily with a meal.  ? furosemide (LASIX) 20 MG tablet Take 20 mg by mouth daily in the afternoon.  ? metoprolol tartrate (LOPRESSOR) 25 MG tablet Take 1 tablet (25 mg total) by mouth 2 (two) times daily.  ? telmisartan (MICARDIS) 80 MG tablet Take 80 mg by mouth daily.  ? traZODone (DESYREL) 50 MG tablet  Take 50 mg by mouth at bedtime as needed for sleep.  ?  ? ?Allergies:   Patient has no known allergies.  ? ?Social History  ? ?Socioeconomic History  ? Marital status: Married  ?  Spouse name: Not on file  ? Number of children: Not on file  ? Years of education: Not on file  ? Highest education level: Not on file  ?Occupational History  ? Not on file  ?Tobacco Use  ? Smoking status: Never  ? Smokeless tobacco: Never  ?Vaping Use  ? Vaping Use: Never used  ?Substance and Sexual Activity  ? Alcohol use: No  ? Drug use: No  ? Sexual activity: Not on file  ?Other Topics Concern  ? Not on file  ?Social History Narrative  ? Not on file  ? ?Social Determinants of Health  ? ?Financial Resource Strain: Not on file  ?Food Insecurity: Not on file  ?Transportation  Needs: Not on file  ?Physical Activity: Not on file  ?Stress: Not on file  ?Social Connections: Not on file  ?  ? ?Family History: ?The patient's family history includes Heart attack in her father; Hypertension in her mother; Kidney disease in her mother. ? ?ROS:   ?Please see the history of present illness.    ?All other systems reviewed and are negative. ? ?EKGs/Labs/Other Studies Reviewed:   ? ?The following studies were reviewed today: ?EKG reveals atrial fibrillation, fairly well-controlled ventricular rate, ? ? ?Recent Labs: ?No results found for requested labs within last 8760 hours.  ?Recent Lipid Panel ?   ?Component Value Date/Time  ? CHOL 151 12/30/2016 1155  ? TRIG 228 (H) 04/01/2020 1046  ? HDL 55 12/30/2016 1155  ? CHOLHDL 2.7 12/30/2016 1155  ? Loda 76 12/30/2016 1155  ? ? ?Physical Exam:   ? ?VS:  BP (!) 136/92   Pulse 91   Ht 5' (1.524 m)   Wt 178 lb 12.8 oz (81.1 kg)   SpO2 98%   BMI 34.92 kg/m?    ? ?Wt Readings from Last 3 Encounters:  ?07/15/21 178 lb 12.8 oz (81.1 kg)  ?01/08/21 180 lb 3.2 oz (81.7 kg)  ?05/25/20 163 lb 3.2 oz (74 kg)  ?  ? ?GEN: Patient is in no acute distress ?HEENT: Normal ?NECK: No JVD; No carotid bruits ?LYMPHATICS:  No lymphadenopathy ?CARDIAC: Hear sounds regular, 2/6 systolic murmur at the apex. ?RESPIRATORY:  Clear to auscultation without rales, wheezing or rhonchi  ?ABDOMEN: Soft, non-tender, non-distended ?[place]

## 2021-07-16 ENCOUNTER — Telehealth: Payer: Self-pay | Admitting: *Deleted

## 2021-07-16 NOTE — Telephone Encounter (Signed)
Patient given detailed instructions per Myocardial Perfusion Study Information Sheet for the test on 07/24/21 at 1130. Patient notified to arrive 15 minutes early and that it is imperative to arrive on time for appointment to keep from having the test rescheduled. ? If you need to cancel or reschedule your appointment, please call the office within 24 hours of your appointment. . Patient verbalized understanding.Gareld Obrecht, Ranae [place] ? ? ?

## 2021-07-24 ENCOUNTER — Ambulatory Visit (INDEPENDENT_AMBULATORY_CARE_PROVIDER_SITE_OTHER): Payer: Medicare PPO

## 2021-07-24 DIAGNOSIS — Z954 Presence of other heart-valve replacement: Secondary | ICD-10-CM

## 2021-07-24 DIAGNOSIS — R0609 Other forms of dyspnea: Secondary | ICD-10-CM

## 2021-07-24 DIAGNOSIS — I480 Paroxysmal atrial fibrillation: Secondary | ICD-10-CM | POA: Diagnosis not present

## 2021-07-24 DIAGNOSIS — Z9889 Other specified postprocedural states: Secondary | ICD-10-CM | POA: Diagnosis not present

## 2021-07-24 MED ORDER — REGADENOSON 0.4 MG/5ML IV SOLN
0.4000 mg | Freq: Once | INTRAVENOUS | Status: AC
  Administered 2021-07-24: 0.4 mg via INTRAVENOUS

## 2021-07-24 MED ORDER — TECHNETIUM TC 99M TETROFOSMIN IV KIT
32.3000 | PACK | Freq: Once | INTRAVENOUS | Status: AC | PRN
  Administered 2021-07-24: 32.3 via INTRAVENOUS

## 2021-07-24 MED ORDER — TECHNETIUM TC 99M TETROFOSMIN IV KIT
10.9000 | PACK | Freq: Once | INTRAVENOUS | Status: AC | PRN
Start: 2021-07-24 — End: 2021-07-24
  Administered 2021-07-24: 10.9 via INTRAVENOUS

## 2021-07-25 LAB — MYOCARDIAL PERFUSION IMAGING
LV dias vol: 60 mL (ref 46–106)
LV sys vol: 23 mL
Nuc Stress EF: 62 %
Peak HR: 100 {beats}/min
Rest HR: 83 {beats}/min
Rest Nuclear Isotope Dose: 10.9 mCi
SDS: 7
SRS: 9
SSS: 14
ST Depression (mm): 0 mm
Stress Nuclear Isotope Dose: 32.3 mCi
TID: 1.19

## 2021-07-25 LAB — ECHOCARDIOGRAM COMPLETE
Area-P 1/2: 4.06 cm2
Calc EF: 55.4 %
Height: 60 in
MV VTI: 1.3 cm2
S' Lateral: 3 cm
Single Plane A2C EF: 59 %
Single Plane A4C EF: 50.6 %
Weight: 2848 oz

## 2021-07-29 DIAGNOSIS — M25571 Pain in right ankle and joints of right foot: Secondary | ICD-10-CM | POA: Diagnosis not present

## 2021-07-29 DIAGNOSIS — S9001XA Contusion of right ankle, initial encounter: Secondary | ICD-10-CM | POA: Diagnosis not present

## 2021-07-29 DIAGNOSIS — S99911A Unspecified injury of right ankle, initial encounter: Secondary | ICD-10-CM | POA: Diagnosis not present

## 2021-08-15 DIAGNOSIS — Z45018 Encounter for adjustment and management of other part of cardiac pacemaker: Secondary | ICD-10-CM | POA: Diagnosis not present

## 2021-08-20 ENCOUNTER — Ambulatory Visit (INDEPENDENT_AMBULATORY_CARE_PROVIDER_SITE_OTHER): Payer: Medicare PPO | Admitting: Cardiology

## 2021-08-20 ENCOUNTER — Encounter: Payer: Self-pay | Admitting: Cardiology

## 2021-08-20 VITALS — BP 152/96 | HR 98 | Ht 60.0 in | Wt 179.4 lb

## 2021-08-20 DIAGNOSIS — I480 Paroxysmal atrial fibrillation: Secondary | ICD-10-CM | POA: Diagnosis not present

## 2021-08-20 DIAGNOSIS — Z9889 Other specified postprocedural states: Secondary | ICD-10-CM

## 2021-08-20 DIAGNOSIS — I10 Essential (primary) hypertension: Secondary | ICD-10-CM | POA: Diagnosis not present

## 2021-08-20 DIAGNOSIS — Z950 Presence of cardiac pacemaker: Secondary | ICD-10-CM

## 2021-08-20 DIAGNOSIS — E669 Obesity, unspecified: Secondary | ICD-10-CM

## 2021-08-20 NOTE — Progress Notes (Signed)
?Cardiology Office Note:   ? ?Date:  08/20/2021  ? ?ID:  Isabel Hardy, DOB Jul 13, 1950, MRN [id number] ? ?PCP:  Isabel Grammes, MD  ?Cardiologist:  Isabel Lindau, MD  ? ?Referring MD: Isabel Grammes, MD  ? ? ?ASSESSMENT:   ? ?1. Essential hypertension   ?2. S/P MVR (mitral valve repair)   ?3. Presence of permanent cardiac pacemaker   ?4. H/O mitral valve repair   ?5. Obesity (BMI 35.0-39.9 without comorbidity)   ?6. Paroxysmal A-fib (HCC)   ? ?PLAN:   ? ?In order of problems listed above: ? ?Primary prevention stressed with patient.  Importance of compliance with diet and medication stressed and she vocalized understanding. ?Essential hypertension: Blood pressure stable and diet was emphasized.  Lifestyle modification urged. ?Paroxysmal atrial fibrillation:I discussed with the patient atrial fibrillation, disease process. Management and therapy including rate and rhythm control, anticoagulation benefits and potential risks were discussed extensively with the patient. Patient had multiple questions which were answered to patient's satisfaction.  She is tolerating anticoagulation well.  Echo report discussed with her at length.  She is tolerating anticoagulation well.  Mitral valve repair: Echo report was fine with no mitral regurgitation and discussed with the patient at length. ?Obesity: Weight reduction stressed and diet was emphasized and she promises to do better.  Risks of obesity explained. ?Patient will be seen in follow-up appointment in 6 months or earlier if the patient has any concerns ? ? ? ?Medication Adjustments/Labs and Tests Ordered: ?Current medicines are reviewed at length with the patient today.  Concerns regarding medicines are outlined above.  ?No orders of the defined types were placed in this encounter. ? ?No orders of the defined types were placed in this encounter. ? ? ? ?No chief complaint on file. ?  ? ?History of Present Illness:   ? ?Isabel Hardy is a 71 y.o. female.   Patient has past medical history of paroxysmal atrial fibrillation, mitral valve repair, essential hypertension dyslipidemia and obesity.  She denies any problems at this time and takes care of activities of daily living.  No chest pain orthopnea or PND.  She ambulates on a regular basis.  At the time of my evaluation, the patient is alert awake oriented and in no distress. ? ?Past Medical History:  ?Diagnosis Date  ? Acute renal failure ([place])   ? Atherosclerosis of aorta ([place]) 04/02/2020  ? ATN (acute tubular necrosis) ([place]) 04/02/2020  ? Atrial fibrillation ([place])   ? Atrial flutter ([place]) 10/03/2015  ? 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 04/01/20 Renal US  ? COVID-19 virus infection 04/01/2020  ? Dyslipidemia 11/23/2014  ? Dysrhythmia   ? Essential hypertension 11/23/2014  ? H/O mitral valve repair   ? Hematuria 04/02/2020  ? Hip pain 12/31/2018  ? Hx of Grade III diastolic dysfunction 123456  ? TTE 10/30/2017  ? Hypertension   ? Hypovolemia associated with vomiting 04/02/2020  ? Hypovolemic Hypotension 04/02/2020  ? Obesity (BMI 35.0-39.9 without comorbidity) 01/08/2021  ? Paroxysmal A-fib ([place]) 10/03/2015  ? Added automatically from request for surgery [phone]  Formatting of this note might be different from the original. Added automatically from request for surgery [id number]  ? Pneumonia due to COVID-19 virus 04/02/2020  ? Presence of permanent cardiac pacemaker   ? Pyuria 04/02/2020  ? S/P MVR (mitral valve repair) 10/03/2015  ? Sinus node dysfunction ([place]) 09/24/2016  ? Somnolence 04/02/2020  ? 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 Stroke [facility])   ?  per patient, "mini stroke in 2005/06"  ? ? ?Past Surgical History:  ?Procedure Laterality Date  ? ABDOMINAL HYSTERECTOMY    ? APPENDECTOMY    ? CATARACT EXTRACTION W/ INTRAOCULAR LENS IMPLANT Left   ? CESAREAN SECTION    ? CHOLECYSTECTOMY    ? EYE SURGERY    ? GALLBLADDER SURGERY    ? LEFT ATRIAL APPENDAGE OCCLUSION    ? Left Appendage  [facility]  ? LUMBAR LAMINECTOMY/DECOMPRESSION MICRODISCECTOMY N/A 01/19/2019  ? Procedure: LUMBAR LAMINECTOMY AND FORAMINOTOMY LUMBAR THREE- LUMBAR FOUR, LUMBAR FOUR- LUMBAR FIVE;  Surgeon: Newman Pies, MD;  Location: [place];  Service: Neurosurgery;  Laterality: N/A;  LUMBAR LAMINECTOMY AND FORAMINOTOMY LUMBAR THREE- LUMBAR FOUR, LUMBAR FOUR- LUMBAR FIVE  ? MAZE    ? [place]  ? MITRAL VALVE ANNULOPLASTY    ? [facility]  ? open heart surgery    ? TONSILLECTOMY    ? ? ?Current Medications: ?Current Meds  ?Medication Sig  ? acetaminophen (TYLENOL) 650 MG CR tablet Take 650 mg by mouth every 8 (eight) hours as needed for pain.  ? alendronate (FOSAMAX) 70 MG tablet Take 70 mg by mouth once a week. Take with a full glass of water on an empty stomach.  ? ALPRAZolam (XANAX) 0.5 MG tablet Take 0.5 mg by mouth at bedtime as needed for anxiety or sleep.  ? apixaban (ELIQUIS) 5 MG TABS tablet Take 5 mg by mouth 2 (two) times a day.   ? Ascorbic Acid (VITAMIN C) 1000 MG tablet Take 1,000 mg by mouth daily.  ? atorvastatin (LIPITOR) 40 MG tablet Take 40 mg by mouth every evening.   ? Calcium Carbonate-Vit D-Min (CALCIUM 1200) 1200-1000 MG-UNIT CHEW Chew 1 tablet by mouth daily.  ? cholecalciferol (VITAMIN D3) 25 MCG (1000 UNIT) tablet Take 1,000 Units by mouth daily.  ? dapagliflozin propanediol (FARXIGA) 10 MG TABS tablet Take 10 mg by mouth daily.  ? dronedarone (MULTAQ) 400 MG tablet Take 400 mg by mouth 2 (two) times daily with a meal.  ? furosemide (LASIX) 20 MG tablet Take 20 mg by mouth daily in the afternoon.  ? metoprolol tartrate (LOPRESSOR) 25 MG tablet Take 1 tablet (25 mg total) by mouth 2 (two) times daily.  ? telmisartan (MICARDIS) 80 MG tablet Take 80 mg by mouth daily.  ? traZODone (DESYREL) 50 MG tablet Take 50 mg by mouth at bedtime as needed for sleep.  ?  ? ?Allergies:   Patient has no known allergies.  ? ?Social History  ? ?Socioeconomic History  ? Marital status: Married  ?  Spouse name: Not on file  ?  Number of children: Not on file  ? Years of education: Not on file  ? Highest education level: Not on file  ?Occupational History  ? Not on file  ?Tobacco Use  ? Smoking status: Never  ? Smokeless tobacco: Never  ?Vaping Use  ? Vaping Use: Never used  ?Substance and Sexual Activity  ? Alcohol use: No  ? Drug use: No  ? Sexual activity: Not on file  ?Other Topics Concern  ? Not on file  ?Social History Narrative  ? Not on file  ? ?Social Determinants of Health  ? ?Financial Resource Strain: Not on file  ?Food Insecurity: Not on file  ?Transportation Needs: Not on file  ?Physical Activity: Not on file  ?Stress: Not on file  ?Social Connections: Not on file  ?  ? ?Family History: ?The patient's family history includes Heart attack in  her father; Hypertension in her mother; Kidney disease in her mother. ? ?ROS:   ?Please see the history of present illness.    ?All other systems reviewed and are negative. ? ?EKGs/Labs/Other Studies Reviewed:   ? ?The following studies were reviewed today: ?I discussed my findings with the patient at length. ? ? ?Recent Labs: ?No results found for requested labs within last 8760 hours.  ?Recent Lipid Panel ?   ?Component Value Date/Time  ? CHOL 151 12/30/2016 1155  ? TRIG 228 (H) 04/01/2020 1046  ? HDL 55 12/30/2016 1155  ? CHOLHDL 2.7 12/30/2016 1155  ? [place] 76 12/30/2016 1155  ? ? ?Physical Exam:   ? ?VS:  BP (!) 152/96   Pulse 98   Ht 5' (1.524 m)   Wt 179 lb 6.4 oz (81.4 kg)   SpO2 99%   BMI 35.04 kg/m?    ? ?Wt Readings from Last 3 Encounters:  ?08/20/21 179 lb 6.4 oz (81.4 kg)  ?07/24/21 178 lb (80.7 kg)  ?07/15/21 178 lb 12.8 oz (81.1 kg)  ?  ? ?GEN: Patient is in no acute distress ?HEENT: Normal ?NECK: No JVD; No carotid bruits ?LYMPHATICS: No lymphadenopathy ?CARDIAC: Hear sounds regular, 2/6 systolic murmur at the apex. ?RESPIRATORY:  Clear to auscultation without rales, wheezing or rhonchi  ?ABDOMEN: Soft, non-tender, non-distended ?MUSCULOSKELETAL:  No edema; No  deformity  ?SKIN: Warm and dry ?NEUROLOGIC:  Alert and oriented x 3 ?PSYCHIATRIC:  Normal affect  ? ?Signed, ?Isabel Lindau, MD  ?08/20/2021 9:18 AM    ?[place]  ?

## 2021-08-20 NOTE — Patient Instructions (Signed)

## 2021-09-02 DIAGNOSIS — S91001A Unspecified open wound, right ankle, initial encounter: Secondary | ICD-10-CM | POA: Diagnosis not present

## 2021-09-02 DIAGNOSIS — Z6833 Body mass index (BMI) 33.0-33.9, adult: Secondary | ICD-10-CM | POA: Diagnosis not present

## 2021-09-03 DIAGNOSIS — S81801A Unspecified open wound, right lower leg, initial encounter: Secondary | ICD-10-CM | POA: Diagnosis not present

## 2021-09-10 DIAGNOSIS — S81801A Unspecified open wound, right lower leg, initial encounter: Secondary | ICD-10-CM | POA: Diagnosis not present

## 2021-09-13 DIAGNOSIS — I110 Hypertensive heart disease with heart failure: Secondary | ICD-10-CM | POA: Diagnosis not present

## 2021-09-13 DIAGNOSIS — I4891 Unspecified atrial fibrillation: Secondary | ICD-10-CM | POA: Diagnosis not present

## 2021-09-13 DIAGNOSIS — N1832 Chronic kidney disease, stage 3b: Secondary | ICD-10-CM | POA: Diagnosis not present

## 2021-09-13 DIAGNOSIS — Z131 Encounter for screening for diabetes mellitus: Secondary | ICD-10-CM | POA: Diagnosis not present

## 2021-09-13 DIAGNOSIS — S91001A Unspecified open wound, right ankle, initial encounter: Secondary | ICD-10-CM | POA: Diagnosis not present

## 2021-09-13 DIAGNOSIS — Z6833 Body mass index (BMI) 33.0-33.9, adult: Secondary | ICD-10-CM | POA: Diagnosis not present

## 2021-09-24 DIAGNOSIS — S81801A Unspecified open wound, right lower leg, initial encounter: Secondary | ICD-10-CM | POA: Diagnosis not present

## 2021-10-08 DIAGNOSIS — S81801A Unspecified open wound, right lower leg, initial encounter: Secondary | ICD-10-CM | POA: Diagnosis not present

## 2021-10-11 DIAGNOSIS — R109 Unspecified abdominal pain: Secondary | ICD-10-CM | POA: Diagnosis not present

## 2021-10-11 DIAGNOSIS — S39011A Strain of muscle, fascia and tendon of abdomen, initial encounter: Secondary | ICD-10-CM | POA: Diagnosis not present

## 2021-10-11 DIAGNOSIS — Z6832 Body mass index (BMI) 32.0-32.9, adult: Secondary | ICD-10-CM | POA: Diagnosis not present

## 2021-11-14 DIAGNOSIS — Z45018 Encounter for adjustment and management of other part of cardiac pacemaker: Secondary | ICD-10-CM | POA: Diagnosis not present

## 2021-11-18 DIAGNOSIS — N1832 Chronic kidney disease, stage 3b: Secondary | ICD-10-CM | POA: Diagnosis not present

## 2021-11-20 DIAGNOSIS — M79662 Pain in left lower leg: Secondary | ICD-10-CM | POA: Diagnosis not present

## 2021-11-20 DIAGNOSIS — Z6833 Body mass index (BMI) 33.0-33.9, adult: Secondary | ICD-10-CM | POA: Diagnosis not present

## 2021-11-20 DIAGNOSIS — M7989 Other specified soft tissue disorders: Secondary | ICD-10-CM | POA: Diagnosis not present

## 2021-11-27 DIAGNOSIS — M1712 Unilateral primary osteoarthritis, left knee: Secondary | ICD-10-CM | POA: Diagnosis not present

## 2021-11-28 DIAGNOSIS — N2581 Secondary hyperparathyroidism of renal origin: Secondary | ICD-10-CM | POA: Diagnosis not present

## 2021-11-28 DIAGNOSIS — D631 Anemia in chronic kidney disease: Secondary | ICD-10-CM | POA: Diagnosis not present

## 2021-11-28 DIAGNOSIS — I129 Hypertensive chronic kidney disease with stage 1 through stage 4 chronic kidney disease, or unspecified chronic kidney disease: Secondary | ICD-10-CM | POA: Diagnosis not present

## 2021-11-28 DIAGNOSIS — N1832 Chronic kidney disease, stage 3b: Secondary | ICD-10-CM | POA: Diagnosis not present

## 2021-12-16 DIAGNOSIS — Z0000 Encounter for general adult medical examination without abnormal findings: Secondary | ICD-10-CM | POA: Diagnosis not present

## 2021-12-16 DIAGNOSIS — Z79899 Other long term (current) drug therapy: Secondary | ICD-10-CM | POA: Diagnosis not present

## 2021-12-16 DIAGNOSIS — N1832 Chronic kidney disease, stage 3b: Secondary | ICD-10-CM | POA: Diagnosis not present

## 2021-12-16 DIAGNOSIS — D692 Other nonthrombocytopenic purpura: Secondary | ICD-10-CM | POA: Diagnosis not present

## 2021-12-16 DIAGNOSIS — Z6832 Body mass index (BMI) 32.0-32.9, adult: Secondary | ICD-10-CM | POA: Diagnosis not present

## 2021-12-16 DIAGNOSIS — R7302 Impaired glucose tolerance (oral): Secondary | ICD-10-CM | POA: Diagnosis not present

## 2021-12-16 DIAGNOSIS — E7800 Pure hypercholesterolemia, unspecified: Secondary | ICD-10-CM | POA: Diagnosis not present

## 2021-12-16 DIAGNOSIS — I5042 Chronic combined systolic (congestive) and diastolic (congestive) heart failure: Secondary | ICD-10-CM | POA: Diagnosis not present

## 2021-12-16 DIAGNOSIS — I4891 Unspecified atrial fibrillation: Secondary | ICD-10-CM | POA: Diagnosis not present

## 2022-01-24 ENCOUNTER — Telehealth: Payer: Self-pay

## 2022-01-24 NOTE — Telephone Encounter (Signed)
   Pre-operative Risk Assessment    Patient Name: Isabel Hardy  DOB: Mar 19, 1951 MRN: [id number]     Request for Surgical Clearance    Procedure:  Dental Extraction - Amount of Teeth to be Pulled:  2  Date of Surgery:  Clearance TBD                                 Surgeon:  Maudry Mayhew Surgeon's Group or Practice Name:  [facility] Phone number:  [phone] Fax number:  [phone]   Type of Clearance Requested:   - Pharmacy:  Hold Apixaban (Eliquis) please advise    Type of Anesthesia:  mild intravenous sedation using versed  Additional requests/questions:  Does this patient need antibiotics?  Signed, Lowella Grip   01/24/2022, 1:05 PM

## 2022-01-27 MED ORDER — AMOXICILLIN 500 MG PO TABS: ORAL_TABLET | ORAL | 0 refills | Status: AC

## 2022-01-27 NOTE — Telephone Encounter (Signed)
Patient with diagnosis of Afib on Eliquis for anticoagulation.    Procedure: Dental Extraction - Amount of Teeth to be Pulled:  2 Date of procedure: TBD   CHA2DS2-VASc Score = 7  This indicates a 11.2% annual risk of stroke. The patient's score is based upon: CHF History: 1 HTN History: 1 Diabetes History: 0 Stroke History: 2 Vascular Disease History: 1 Age Score: 1 Gender Score: 1   CrCl 36 mL/min Platelet count 177K  Patient does require pre-op antibiotics for dental procedure given history of MVR. Would recommend amoxicillin 2 g 30 minutes prior to extractions. Please inform the patient that a prescription has been sent to patient's preferred pharmacy.  Per office protocol, patient does not need to hold Eliquis prior to procedure.    **This guidance is not considered finalized until pre-operative APP has relayed final recommendations.**

## 2022-01-27 NOTE — Telephone Encounter (Signed)
   Primary Cardiologist: None  Chart reviewed as part of pre-operative protocol coverage. Simple dental extractions (1 or 2 teeth) are considered low risk procedures per guidelines and generally do not require any specific cardiac clearance. It is also generally accepted that for simple extractions and dental cleanings, there is no need to interrupt blood thinner therapy.   SBE prophylaxis is required for the patient. Amoxicillin prescription has been sent to patient's pharmacy.   I will route this recommendation to the requesting party via Epic fax function and remove from pre-op pool.  Please call with questions.  Emmaline Life, NP-C  01/27/2022, 5:18 PM [address] Office [phone] Fax [phone]

## 2022-02-13 DIAGNOSIS — Z45018 Encounter for adjustment and management of other part of cardiac pacemaker: Secondary | ICD-10-CM | POA: Diagnosis not present

## 2022-03-17 DIAGNOSIS — Z6832 Body mass index (BMI) 32.0-32.9, adult: Secondary | ICD-10-CM | POA: Diagnosis not present

## 2022-03-17 DIAGNOSIS — N1832 Chronic kidney disease, stage 3b: Secondary | ICD-10-CM | POA: Diagnosis not present

## 2022-03-17 DIAGNOSIS — M7122 Synovial cyst of popliteal space [Baker], left knee: Secondary | ICD-10-CM | POA: Diagnosis not present

## 2022-03-17 DIAGNOSIS — I110 Hypertensive heart disease with heart failure: Secondary | ICD-10-CM | POA: Diagnosis not present

## 2022-03-17 DIAGNOSIS — I4891 Unspecified atrial fibrillation: Secondary | ICD-10-CM | POA: Diagnosis not present

## 2022-03-24 DIAGNOSIS — N1832 Chronic kidney disease, stage 3b: Secondary | ICD-10-CM | POA: Diagnosis not present

## 2022-03-31 DIAGNOSIS — N1832 Chronic kidney disease, stage 3b: Secondary | ICD-10-CM | POA: Diagnosis not present

## 2022-03-31 DIAGNOSIS — I129 Hypertensive chronic kidney disease with stage 1 through stage 4 chronic kidney disease, or unspecified chronic kidney disease: Secondary | ICD-10-CM | POA: Diagnosis not present

## 2022-03-31 DIAGNOSIS — D631 Anemia in chronic kidney disease: Secondary | ICD-10-CM | POA: Diagnosis not present

## 2022-03-31 DIAGNOSIS — N2581 Secondary hyperparathyroidism of renal origin: Secondary | ICD-10-CM | POA: Diagnosis not present

## 2022-04-29 ENCOUNTER — Telehealth: Payer: Self-pay

## 2022-04-29 NOTE — Patient Outreach (Signed)
  Care Coordination   Initial Visit Note   04/29/2022 Name: Isabel Hardy MRN: [id number] DOB: June 19, 1950  Isabel Hardy is a 72 y.o. year old female who sees Serita Grammes, MD for primary care. I spoke with  [name] by phone today.  What matters to the patients health and wellness today?  Placed call to patient today to review and offer [facility] care coordination program. Patient reports she is doing well and denies any needs.    SDOH assessments and interventions completed:  No     Care Coordination Interventions:  No, not indicated   Follow up plan: No further intervention required.   Encounter Outcome:  Pt. Visit Completed   Tomasa Rand, RN, BSN, CEN [place] Coordinator [phone]

## 2022-05-15 DIAGNOSIS — Z45018 Encounter for adjustment and management of other part of cardiac pacemaker: Secondary | ICD-10-CM | POA: Diagnosis not present

## 2022-05-15 DIAGNOSIS — Z950 Presence of cardiac pacemaker: Secondary | ICD-10-CM | POA: Diagnosis not present

## 2022-06-27 DIAGNOSIS — R0981 Nasal congestion: Secondary | ICD-10-CM | POA: Diagnosis not present

## 2022-06-27 DIAGNOSIS — I4891 Unspecified atrial fibrillation: Secondary | ICD-10-CM | POA: Diagnosis not present

## 2022-06-27 DIAGNOSIS — J40 Bronchitis, not specified as acute or chronic: Secondary | ICD-10-CM | POA: Diagnosis not present

## 2022-06-27 DIAGNOSIS — J329 Chronic sinusitis, unspecified: Secondary | ICD-10-CM | POA: Diagnosis not present

## 2022-06-27 DIAGNOSIS — I10 Essential (primary) hypertension: Secondary | ICD-10-CM | POA: Diagnosis not present

## 2022-06-27 DIAGNOSIS — Z6833 Body mass index (BMI) 33.0-33.9, adult: Secondary | ICD-10-CM | POA: Diagnosis not present

## 2022-06-27 LAB — LAB REPORT - SCANNED
A1c: 5.2
Albumin, Urine POC: 59.8
Albumin/Creatinine Ratio, Urine, POC: 45
Creatinine, Urine.: 134.1
EGFR: 37

## 2022-07-02 DIAGNOSIS — I110 Hypertensive heart disease with heart failure: Secondary | ICD-10-CM | POA: Diagnosis not present

## 2022-07-02 DIAGNOSIS — I480 Paroxysmal atrial fibrillation: Secondary | ICD-10-CM | POA: Diagnosis not present

## 2022-07-02 DIAGNOSIS — Z7901 Long term (current) use of anticoagulants: Secondary | ICD-10-CM | POA: Diagnosis not present

## 2022-07-02 DIAGNOSIS — I2510 Atherosclerotic heart disease of native coronary artery without angina pectoris: Secondary | ICD-10-CM | POA: Diagnosis not present

## 2022-07-02 DIAGNOSIS — Z950 Presence of cardiac pacemaker: Secondary | ICD-10-CM | POA: Diagnosis not present

## 2022-07-02 DIAGNOSIS — E785 Hyperlipidemia, unspecified: Secondary | ICD-10-CM | POA: Diagnosis not present

## 2022-07-02 DIAGNOSIS — I4819 Other persistent atrial fibrillation: Secondary | ICD-10-CM | POA: Diagnosis not present

## 2022-07-02 DIAGNOSIS — I495 Sick sinus syndrome: Secondary | ICD-10-CM | POA: Diagnosis not present

## 2022-07-02 DIAGNOSIS — Z45018 Encounter for adjustment and management of other part of cardiac pacemaker: Secondary | ICD-10-CM | POA: Diagnosis not present

## 2022-07-02 DIAGNOSIS — I509 Heart failure, unspecified: Secondary | ICD-10-CM | POA: Diagnosis not present

## 2022-07-03 DIAGNOSIS — Z45018 Encounter for adjustment and management of other part of cardiac pacemaker: Secondary | ICD-10-CM | POA: Diagnosis not present

## 2022-07-09 DIAGNOSIS — I509 Heart failure, unspecified: Secondary | ICD-10-CM | POA: Diagnosis not present

## 2022-07-09 DIAGNOSIS — I130 Hypertensive heart and chronic kidney disease with heart failure and stage 1 through stage 4 chronic kidney disease, or unspecified chronic kidney disease: Secondary | ICD-10-CM | POA: Diagnosis not present

## 2022-07-09 DIAGNOSIS — Z8673 Personal history of transient ischemic attack (TIA), and cerebral infarction without residual deficits: Secondary | ICD-10-CM | POA: Diagnosis not present

## 2022-07-09 DIAGNOSIS — I4819 Other persistent atrial fibrillation: Secondary | ICD-10-CM | POA: Diagnosis not present

## 2022-07-09 DIAGNOSIS — Z950 Presence of cardiac pacemaker: Secondary | ICD-10-CM | POA: Diagnosis not present

## 2022-07-09 DIAGNOSIS — I495 Sick sinus syndrome: Secondary | ICD-10-CM | POA: Diagnosis not present

## 2022-07-09 DIAGNOSIS — Z7901 Long term (current) use of anticoagulants: Secondary | ICD-10-CM | POA: Diagnosis not present

## 2022-07-09 DIAGNOSIS — N189 Chronic kidney disease, unspecified: Secondary | ICD-10-CM | POA: Diagnosis not present

## 2022-07-13 DIAGNOSIS — I4819 Other persistent atrial fibrillation: Secondary | ICD-10-CM | POA: Diagnosis not present

## 2022-08-13 ENCOUNTER — Encounter: Payer: Self-pay | Admitting: Cardiology

## 2022-08-13 ENCOUNTER — Telehealth: Payer: Self-pay

## 2022-08-13 ENCOUNTER — Ambulatory Visit: Payer: Medicare PPO | Attending: Cardiology | Admitting: Cardiology

## 2022-08-13 VITALS — BP 164/92 | HR 87 | Ht 60.0 in | Wt 171.4 lb

## 2022-08-13 DIAGNOSIS — I10 Essential (primary) hypertension: Secondary | ICD-10-CM | POA: Diagnosis not present

## 2022-08-13 DIAGNOSIS — Z950 Presence of cardiac pacemaker: Secondary | ICD-10-CM

## 2022-08-13 DIAGNOSIS — Z9889 Other specified postprocedural states: Secondary | ICD-10-CM | POA: Diagnosis not present

## 2022-08-13 DIAGNOSIS — E669 Obesity, unspecified: Secondary | ICD-10-CM

## 2022-08-13 DIAGNOSIS — I700 Atherosclerosis of aorta: Secondary | ICD-10-CM | POA: Diagnosis not present

## 2022-08-13 DIAGNOSIS — E785 Hyperlipidemia, unspecified: Secondary | ICD-10-CM

## 2022-08-13 DIAGNOSIS — I4819 Other persistent atrial fibrillation: Secondary | ICD-10-CM

## 2022-08-13 NOTE — Telephone Encounter (Signed)
This pt is looking to switch from [facility] to Dr. Elberta Fortis and has some questions regarding her device and if this would work for her and her home device. Could you please call her 08/14/22 on her cell phone at [phone]. If she wants to move forward send me a message and I will have the front office call her to schedule her an appt.  Thank you

## 2022-08-13 NOTE — Progress Notes (Signed)
Cardiology Office Note:    Date:  08/13/2022   ID:  Isabel Hardy, DOB 1950/12/21, MRN [id number]  PCP:  Buckner Malta, MD  Cardiologist:  Garwin Brothers, MD   Referring MD: Buckner Malta, MD    ASSESSMENT:    1. Atherosclerosis of aorta   2. Persistent atrial fibrillation   3. Essential hypertension   4. H/O mitral valve repair   5. Dyslipidemia   6. Obesity (BMI 35.0-39.9 without comorbidity)   7. Presence of permanent cardiac pacemaker   8. S/P MVR (mitral valve repair)    PLAN:    In order of problems listed above:  Primary prevention stressed with the patient.  Importance of compliance with diet medications as send she vocalized understanding.  She does her exercise bicycle very regular basis without any symptoms  Persistent atrial fibrillation:I discussed with the patient atrial fibrillation, disease process. Management and therapy including rate and rhythm control, anticoagulation benefits and potential risks were discussed extensively with the patient. Patient had multiple questions which were answered to patient's satisfaction.  She is tolerating anticoagulation well.  She wants to try no more cardioversion or ablation.  She is happy with this at this time.  We will stop her Multaq. Post permanent pacemaker insertion: She mentions to me that she wants to now consolidate her care at our place and we will set her up with our electrophysiology colleagues for pacemaker follow-up and also for atrial fibrillation issues. Essential hypertension: Blood pressure stable intact.  Size.  She mentions to me that her blood pressure is stable at home and she will get as log of her blood pressures. Aortic atherosclerosis and mixed dyslipidemia: On lipid-lowering medications followed by primary care.  I reviewed lipid works and seems to be fine. Patient will be seen in follow-up appointment in 6 months or earlier if the patient has any concerns.    Medication Adjustments/Labs  and Tests Ordered: Current medicines are reviewed at length with the patient today.  Concerns regarding medicines are outlined above.  No orders of the defined types were placed in this encounter.  No orders of the defined types were placed in this encounter.    No chief complaint on file.    History of Present Illness:    Isabel Hardy is a 72 y.o. female.  Patient has past medical history of atrial fibrillation.  She underwent cardioversion in [place] in went back into atrial fibrillation again.  She is on Multaq.  She has history of essential hypertension, mixed dyslipidemia, obesity and permanent pacemaker.  She denies any problems at this time and takes care of activities of daily living.  No chest pain orthopnea or PND.  At the time of my evaluation, the patient is alert awake oriented and in no distress.  She rides her stationary bike on a regular basis.  Past Medical History:  Diagnosis Date   Acute renal failure    Atherosclerosis of aorta 04/02/2020   ATN (acute tubular necrosis) 04/02/2020   Atrial fibrillation    Atrial flutter 10/03/2015   Atrophic kidney, Right 04/02/2020   04/01/20 Renal US   COVID-19 virus infection 04/01/2020   Dyslipidemia 11/23/2014   Dysrhythmia    Essential hypertension 11/23/2014   H/O mitral valve repair    Hematuria 04/02/2020   Hip pain 12/31/2018   Hx of Grade III diastolic dysfunction 10/30/2017   TTE 10/30/2017   Hypertension    Hypovolemia associated with vomiting 04/02/2020   Hypovolemic Hypotension 04/02/2020  Obesity (BMI 35.0-39.9 without comorbidity) 01/08/2021   Paroxysmal A-fib 10/03/2015   Added automatically from request for surgery [id number]  Formatting of this note might be different from the original. Added automatically from request for surgery [id number]   Pneumonia due to COVID-19 virus 04/02/2020   Presence of permanent cardiac pacemaker    Pyuria 04/02/2020   S/P MVR (mitral valve repair) 10/03/2015   Sinus node  dysfunction 09/24/2016   Somnolence 04/02/2020   Spinal stenosis of lumbar region with neurogenic claudication 01/19/2019   Stroke    per patient, "mini stroke in 2005/06"    Past Surgical History:  Procedure Laterality Date   ABDOMINAL HYSTERECTOMY     APPENDECTOMY     CATARACT EXTRACTION W/ INTRAOCULAR LENS IMPLANT Left    CESAREAN SECTION     CHOLECYSTECTOMY     EYE SURGERY     GALLBLADDER SURGERY     LEFT ATRIAL APPENDAGE OCCLUSION     Left Appendage Ligation at [facility]   LUMBAR LAMINECTOMY/DECOMPRESSION MICRODISCECTOMY N/A 01/19/2019   Procedure: LUMBAR LAMINECTOMY AND FORAMINOTOMY LUMBAR THREE- LUMBAR FOUR, LUMBAR FOUR- LUMBAR FIVE;  Surgeon: Tressie Stalker, MD;  Location: [facility] OR;  Service: Neurosurgery;  Laterality: N/A;  LUMBAR LAMINECTOMY AND FORAMINOTOMY LUMBAR THREE- LUMBAR FOUR, LUMBAR FOUR- LUMBAR FIVE   MAZE     [facility]   MITRAL VALVE ANNULOPLASTY     [facility]   open heart surgery     TONSILLECTOMY      Current Medications: Current Meds  Medication Sig   acetaminophen (TYLENOL) 650 MG CR tablet Take 650 mg by mouth every 8 (eight) hours as needed for pain.   amoxicillin (AMOXIL) 500 MG tablet Take 2 grams (4 tablets) by mouth once 30-60 minutes prior to dental procedure   apixaban (ELIQUIS) 5 MG TABS tablet Take 5 mg by mouth 2 (two) times a day.    Ascorbic Acid (VITAMIN C) 1000 MG tablet Take 1,000 mg by mouth daily.   atorvastatin (LIPITOR) 40 MG tablet Take 40 mg by mouth every evening.    Calcium Carbonate-Vit D-Min (CALCIUM 1200) 1200-1000 MG-UNIT CHEW Chew 1 tablet by mouth daily.   cholecalciferol (VITAMIN D3) 25 MCG (1000 UNIT) tablet Take 1,000 Units by mouth daily.   dapagliflozin propanediol (FARXIGA) 10 MG TABS tablet Take 10 mg by mouth daily.   furosemide (LASIX) 20 MG tablet Take 20 mg by mouth daily in the afternoon.   metoprolol tartrate (LOPRESSOR) 50 MG tablet Take 50 mg by mouth 2 (two) times daily.   telmisartan (MICARDIS) 80 MG tablet Take 80 mg by  mouth daily.   traZODone (DESYREL) 50 MG tablet Take 50 mg by mouth at bedtime as needed for sleep.   [DISCONTINUED] dronedarone (MULTAQ) 400 MG tablet Take 400 mg by mouth 2 (two) times daily with a meal.     Allergies:   Patient has no known allergies.   Social History   Socioeconomic History   Marital status: Married    Spouse name: Not on file   Number of children: Not on file   Years of education: Not on file   Highest education level: Not on file  Occupational History   Not on file  Tobacco Use   Smoking status: Never   Smokeless tobacco: Never  Vaping Use   Vaping Use: Never used  Substance and Sexual Activity   Alcohol use: No   Drug use: No   Sexual activity: Not on file  Other Topics Concern   Not on file  Social History Narrative   Not on file   Social Determinants of Health   Financial Resource Strain: Not on file  Food Insecurity: Not on file  Transportation Needs: Not on file  Physical Activity: Not on file  Stress: Not on file  Social Connections: Not on file     Family History: The patient's family history includes Heart attack in her father; Hypertension in her mother; Kidney disease in her mother.  ROS:   Please see the history of present illness.    All other systems reviewed and are negative.  EKGs/Labs/Other Studies Reviewed:    The following studies were reviewed today: EKG reveals a patient with controlled ventricular rate   Recent Labs: No results found for requested labs within last 365 days.  Recent Lipid Panel    Component Value Date/Time   CHOL 151 12/30/2016 1155   TRIG 228 (H) 04/01/2020 1046   HDL 55 12/30/2016 1155   CHOLHDL 2.7 12/30/2016 1155   LDLCALC 76 12/30/2016 1155    Physical Exam:    VS:  BP (!) 164/92   Pulse 87   Ht 5' (1.524 m)   Wt 171 lb 6.4 oz (77.7 kg)   SpO2 98%   BMI 33.47 kg/m     Wt Readings from Last 3 Encounters:  08/13/22 171 lb 6.4 oz (77.7 kg)  08/20/21 179 lb 6.4 oz (81.4 kg)   07/24/21 178 lb (80.7 kg)     GEN: Patient is in no acute distress HEENT: Normal NECK: No JVD; No carotid bruits LYMPHATICS: No lymphadenopathy CARDIAC: Hear sounds regular, 2/6 systolic murmur at the apex. RESPIRATORY:  Clear to auscultation without rales, wheezing or rhonchi  ABDOMEN: Soft, non-tender, non-distended MUSCULOSKELETAL:  No edema; No deformity  SKIN: Warm and dry NEUROLOGIC:  Alert and oriented x 3 PSYCHIATRIC:  Normal affect   Signed, Garwin Brothers, MD  08/13/2022 2:03 PM    [facility]

## 2022-08-13 NOTE — Patient Instructions (Addendum)
Medication Instructions:  Your physician has recommended you make the following change in your medication:   Stop dronedarone (MULTAQ) 400 MG tablet   *If you need a refill on your cardiac medications before your next appointment, please call your pharmacy*   Lab Work: None ordered If you have labs (blood work) drawn today and your tests are completely normal, you will receive your results only by: MyChart Message (if you have MyChart) OR A paper copy in the mail If you have any lab test that is abnormal or we need to change your treatment, we will call you to review the results.   Testing/Procedures: None ordered   Follow-Up: At [facility], you and your health needs are our priority.  As part of our continuing mission to provide you with exceptional heart care, we have created designated Provider Care Teams.  These Care Teams include your primary Cardiologist (physician) and Advanced Practice Providers (APPs -  Physician Assistants and Nurse Practitioners) who all work together to provide you with the care you need, when you need it.  We recommend signing up for the patient portal called "MyChart".  Sign up information is provided on this After Visit Summary.  MyChart is used to connect with patients for Virtual Visits (Telemedicine).  Patients are able to view lab/test results, encounter notes, upcoming appointments, etc.  Non-urgent messages can be sent to your provider as well.   To learn more about what you can do with MyChart, go to [email or web address].    Your next appointment:   9 month(s)  The format for your next appointment:   In Person  Provider:   Belva Crome, MD    Other Instructions none  Important Information About Sugar

## 2022-08-14 DIAGNOSIS — Z45018 Encounter for adjustment and management of other part of cardiac pacemaker: Secondary | ICD-10-CM | POA: Diagnosis not present

## 2022-08-14 DIAGNOSIS — Z950 Presence of cardiac pacemaker: Secondary | ICD-10-CM | POA: Diagnosis not present

## 2022-08-14 NOTE — Telephone Encounter (Signed)
Returned call to Pt.  She would like to transfer care to Dr. Elberta Fortis and wanted to be sure that could happen.  All questions answered for Pt.  She will call [place] office to schedule appt to establish care for her device.

## 2022-09-01 DIAGNOSIS — Z6833 Body mass index (BMI) 33.0-33.9, adult: Secondary | ICD-10-CM | POA: Diagnosis not present

## 2022-09-01 DIAGNOSIS — N1832 Chronic kidney disease, stage 3b: Secondary | ICD-10-CM | POA: Diagnosis not present

## 2022-09-01 DIAGNOSIS — I4891 Unspecified atrial fibrillation: Secondary | ICD-10-CM | POA: Diagnosis not present

## 2022-09-01 DIAGNOSIS — M5432 Sciatica, left side: Secondary | ICD-10-CM | POA: Diagnosis not present

## 2022-09-04 DIAGNOSIS — S42291A Other displaced fracture of upper end of right humerus, initial encounter for closed fracture: Secondary | ICD-10-CM | POA: Diagnosis not present

## 2022-09-04 DIAGNOSIS — S42214A Unspecified nondisplaced fracture of surgical neck of right humerus, initial encounter for closed fracture: Secondary | ICD-10-CM | POA: Diagnosis not present

## 2022-09-04 DIAGNOSIS — M25511 Pain in right shoulder: Secondary | ICD-10-CM | POA: Diagnosis not present

## 2022-09-04 DIAGNOSIS — M79621 Pain in right upper arm: Secondary | ICD-10-CM | POA: Diagnosis not present

## 2022-09-04 DIAGNOSIS — S42211A Unspecified displaced fracture of surgical neck of right humerus, initial encounter for closed fracture: Secondary | ICD-10-CM | POA: Diagnosis not present

## 2022-09-04 DIAGNOSIS — W19XXXA Unspecified fall, initial encounter: Secondary | ICD-10-CM | POA: Diagnosis not present

## 2022-09-09 DIAGNOSIS — S42251A Displaced fracture of greater tuberosity of right humerus, initial encounter for closed fracture: Secondary | ICD-10-CM | POA: Diagnosis not present

## 2022-09-17 DIAGNOSIS — I4891 Unspecified atrial fibrillation: Secondary | ICD-10-CM | POA: Diagnosis not present

## 2022-09-17 DIAGNOSIS — N1832 Chronic kidney disease, stage 3b: Secondary | ICD-10-CM | POA: Diagnosis not present

## 2022-09-17 DIAGNOSIS — I110 Hypertensive heart disease with heart failure: Secondary | ICD-10-CM | POA: Diagnosis not present

## 2022-09-17 DIAGNOSIS — S42291S Other displaced fracture of upper end of right humerus, sequela: Secondary | ICD-10-CM | POA: Diagnosis not present

## 2022-09-17 DIAGNOSIS — Z6832 Body mass index (BMI) 32.0-32.9, adult: Secondary | ICD-10-CM | POA: Diagnosis not present

## 2022-09-17 DIAGNOSIS — D692 Other nonthrombocytopenic purpura: Secondary | ICD-10-CM | POA: Diagnosis not present

## 2022-09-22 ENCOUNTER — Ambulatory Visit: Payer: Medicare PPO | Attending: Cardiology | Admitting: Cardiology

## 2022-09-22 ENCOUNTER — Encounter: Payer: Self-pay | Admitting: Cardiology

## 2022-09-22 VITALS — BP 138/78 | HR 82 | Ht 60.0 in | Wt 170.8 lb

## 2022-09-22 DIAGNOSIS — D6869 Other thrombophilia: Secondary | ICD-10-CM | POA: Diagnosis not present

## 2022-09-22 DIAGNOSIS — I4819 Other persistent atrial fibrillation: Secondary | ICD-10-CM | POA: Diagnosis not present

## 2022-09-22 DIAGNOSIS — I495 Sick sinus syndrome: Secondary | ICD-10-CM | POA: Diagnosis not present

## 2022-09-22 LAB — CUP PACEART INCLINIC DEVICE CHECK
Battery Remaining Longevity: 105 mo
Battery Voltage: 2.99 V
Brady Statistic AP VP Percent: 0.72 %
Brady Statistic AP VS Percent: 94.25 %
Brady Statistic AS VP Percent: 0.04 %
Brady Statistic AS VS Percent: 5.42 %
Brady Statistic RA Percent Paced: 1.8 %
Brady Statistic RV Percent Paced: 19.9 %
Date Time Interrogation Session: 20240603113551
Implantable Lead Connection Status: 753985
Implantable Lead Connection Status: 753985
Implantable Lead Implant Date: 20191127
Implantable Lead Implant Date: 20191127
Implantable Lead Location: 753859
Implantable Lead Location: 753860
Implantable Lead Model: 5076
Implantable Lead Model: 5076
Implantable Pulse Generator Implant Date: 20191127
Lead Channel Impedance Value: 323 Ohm
Lead Channel Impedance Value: 342 Ohm
Lead Channel Impedance Value: 380 Ohm
Lead Channel Impedance Value: 399 Ohm
Lead Channel Pacing Threshold Amplitude: 0 V
Lead Channel Pacing Threshold Amplitude: 0.5 V
Lead Channel Pacing Threshold Amplitude: 0.75 V
Lead Channel Pacing Threshold Pulse Width: 0 ms
Lead Channel Pacing Threshold Pulse Width: 0.4 ms
Lead Channel Pacing Threshold Pulse Width: 0.4 ms
Lead Channel Sensing Intrinsic Amplitude: 0.125 mV
Lead Channel Sensing Intrinsic Amplitude: 0.375 mV
Lead Channel Sensing Intrinsic Amplitude: 10.5 mV
Lead Channel Sensing Intrinsic Amplitude: 12.375 mV
Lead Channel Setting Pacing Amplitude: 1.5 V
Lead Channel Setting Pacing Amplitude: 1.5 V
Lead Channel Setting Pacing Pulse Width: 0.4 ms
Lead Channel Setting Sensing Sensitivity: 0.9 mV
Zone Setting Status: 755011
Zone Setting Status: 755011

## 2022-09-22 MED ORDER — METOPROLOL TARTRATE 100 MG PO TABS: 100.0000 mg | ORAL_TABLET | Freq: Two times a day (BID) | ORAL | 3 refills | Status: DC

## 2022-09-22 NOTE — Patient Instructions (Signed)
Medication Instructions:  Your physician has recommended you make the following change in your medication:  1) INCREASE metoprolol to 100 mg twice daily  *If you need a refill on your cardiac medications before your next appointment, please call your pharmacy*  Follow-Up: At [facility], you and your health needs are our priority.  As part of our continuing mission to provide you with exceptional heart care, we have created designated Provider Care Teams.  These Care Teams include your primary Cardiologist (physician) and Advanced Practice Providers (APPs -  Physician Assistants and Nurse Practitioners) who all work together to provide you with the care you need, when you need it.  Your next appointment:   6 months  Provider:   Loman Brooklyn, MD

## 2022-09-22 NOTE — Progress Notes (Signed)
Electrophysiology Office Note   Date:  09/22/2022   ID:  Isabel Hardy, DOB Jun 12, 1950, MRN [id number]  PCP:  Buckner Malta, MD  Cardiologist:  Revankar Primary Electrophysiologist:  Chundra Sauerwein Jorja Loa, MD    Chief Complaint: pacemaker   History of Present Illness: Isabel Hardy is a 72 y.o. female who is being seen today for the evaluation of pacemaker at the request of Revankar, Aundra Dubin, MD. Presenting today for electrophysiology evaluation.  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  She had a recent cardioversion 07/09/2022.  She was initially on Multaq but this was stopped.  Today, she denies symptoms of palpitations, chest pain, shortness of breath, orthopnea, PND, lower extremity edema, claudication, dizziness, presyncope, syncope, bleeding, or neurologic sequela. The patient is tolerating medications without difficulties.    Past Medical History:  Diagnosis Date   Acute renal failure (HCC)    Atherosclerosis of aorta (HCC) 04/02/2020   ATN (acute tubular necrosis) (HCC) 04/02/2020   Atrial fibrillation (HCC)    Atrial flutter (HCC) 10/03/2015   Atrophic kidney, Right 04/02/2020   04/01/20 Renal US   COVID-19 virus infection 04/01/2020   Dyslipidemia 11/23/2014   Dysrhythmia    Essential hypertension 11/23/2014   H/O mitral valve repair    Hematuria 04/02/2020   Hip pain 12/31/2018   Hx of Grade III diastolic dysfunction 10/30/2017   TTE 10/30/2017   Hypertension    Hypovolemia associated with vomiting 04/02/2020   Hypovolemic Hypotension 04/02/2020   Obesity (BMI 35.0-39.9 without comorbidity) 01/08/2021   Paroxysmal A-fib (HCC) 10/03/2015   Added automatically from request for surgery [id number]  Formatting of this note might be different from the  original. Added automatically from request for surgery [id number]   Pneumonia due to COVID-19 virus 04/02/2020   Presence of permanent cardiac pacemaker    Pyuria 04/02/2020   S/P MVR (mitral valve repair) 10/03/2015   Sinus node dysfunction (HCC) 09/24/2016   Somnolence 04/02/2020   Spinal stenosis of lumbar region with neurogenic claudication 01/19/2019   Stroke [facility])    per patient, "mini stroke in 2005/06"   Past Surgical History:  Procedure Laterality Date   ABDOMINAL HYSTERECTOMY     APPENDECTOMY     CATARACT EXTRACTION W/ INTRAOCULAR LENS IMPLANT Left    CESAREAN SECTION     CHOLECYSTECTOMY     EYE SURGERY     GALLBLADDER SURGERY     LEFT ATRIAL APPENDAGE OCCLUSION     Left Appendage Ligation at [facility]   LUMBAR LAMINECTOMY/DECOMPRESSION MICRODISCECTOMY N/A 01/19/2019   Procedure: LUMBAR LAMINECTOMY AND FORAMINOTOMY LUMBAR THREE- LUMBAR FOUR, LUMBAR FOUR- LUMBAR FIVE;  Surgeon: Tressie Stalker, MD;  [facility];  Service: Neurosurgery;  Laterality: N/A;  LUMBAR LAMINECTOMY AND FORAMINOTOMY LUMBAR THREE- LUMBAR FOUR, LUMBAR FOUR- LUMBAR FIVE   MAZE     [facility]   MITRAL VALVE ANNULOPLASTY     [facility]   open heart surgery     TONSILLECTOMY       Current Outpatient Medications  Medication Sig Dispense Refill   acetaminophen (TYLENOL) 650 MG CR tablet Take 650 mg by mouth every 8 (eight) hours as needed for pain.     amoxicillin (AMOXIL) 500 MG tablet Take 2 grams (4 tablets) by mouth once 30-60 minutes prior to dental procedure 4 tablet 0   apixaban (ELIQUIS) 5 MG TABS tablet  Take 5 mg by mouth 2 (two) times a day.      Ascorbic Acid (VITAMIN C) 1000 MG tablet Take 1,000 mg by mouth daily.     atorvastatin (LIPITOR) 40 MG tablet Take 40 mg by mouth every evening.      Calcium Carbonate-Vit D-Min (CALCIUM 1200) 1200-1000 MG-UNIT CHEW Chew 1 tablet by mouth daily.     cholecalciferol (VITAMIN D3) 25 MCG (1000 UNIT) tablet Take 1,000 Units by mouth daily.     dapagliflozin propanediol  (FARXIGA) 10 MG TABS tablet Take 10 mg by mouth daily.     furosemide (LASIX) 20 MG tablet Take 20 mg by mouth daily in the afternoon.     metoprolol tartrate (LOPRESSOR) 100 MG tablet Take 1 tablet (100 mg total) by mouth 2 (two) times daily. 180 tablet 3   telmisartan (MICARDIS) 80 MG tablet Take 80 mg by mouth daily.     No current facility-administered medications for this visit.    Allergies:   Patient has no known allergies.   Social History:  The patient  reports that she has never smoked. She has never used smokeless tobacco. She reports that she does not drink alcohol and does not use drugs.   Family History:  The patient's family history includes Heart attack in her father; Hypertension in her mother; Kidney disease in her mother.    ROS:  Please see the history of present illness.   Otherwise, review of systems is positive for none.   All other systems are reviewed and negative.    PHYSICAL EXAM: VS:  BP 138/78   Pulse 82   Ht 5' (1.524 m)   Wt 170 lb 12.8 oz (77.5 kg)   SpO2 97%   BMI 33.36 kg/m  , BMI Body mass index is 33.36 kg/m. GEN: Well nourished, well developed, in no acute distress  HEENT: normal  Neck: no JVD, carotid bruits, or masses Cardiac: RRR; no murmurs, rubs, or gallops,no edema  Respiratory:  clear to auscultation bilaterally, normal work of breathing GI: soft, nontender, nondistended, + BS MS: no deformity or atrophy  Skin: warm and dry, device pocket is well healed Neuro:  Strength and sensation are intact Psych: euthymic mood, full affect  EKG:  EKG is not ordered today. Personal review of the ekg ordered 08/13/22 shows atrial fibrillation  Device interrogation is reviewed today in detail.  See PaceArt for details.   Recent Labs: No results found for requested labs within last 365 days.    Lipid Panel     Component Value Date/Time   CHOL 151 12/30/2016 1155   TRIG 228 (H) 04/01/2020 1046   HDL 55 12/30/2016 1155   CHOLHDL 2.7  12/30/2016 1155   LDLCALC 76 12/30/2016 1155     Wt Readings from Last 3 Encounters:  09/22/22 170 lb 12.8 oz (77.5 kg)  08/13/22 171 lb 6.4 oz (77.7 kg)  08/20/21 179 lb 6.4 oz (81.4 kg)      Other studies Reviewed: Additional studies/ records that were reviewed today include: TTE 07/25/21  Review of the above records today demonstrates:   1. Left ventricular ejection fraction, by estimation, is 50 to 55%. The  left ventricle has low normal function. The left ventricle has no regional  wall motion abnormalities. Left ventricular diastolic parameters are  consistent with Grade II diastolic  dysfunction (pseudonormalization).   2. Right ventricular systolic function is normal. The right ventricular  size is normal. There is mildly elevated pulmonary artery systolic  pressure.   3. Left atrial size was severely dilated.   4. Mitral Valve Repair with 30 mm Physio ring annuloplasty and leaflet  repair, Cox-MAZE procedure with RF ablation and Cryo Left and Right sided  lesion sets, ligation of the atrial appendage, left atrial repair and  reconstruction with bovine  pericardium.. The mitral valve has been repaired/replaced. Mild mitral  valve regurgitation. The mean mitral valve gradient is 3.0 mmHg. Procedure  Date: 2017.   5. The aortic valve is normal in structure. Aortic valve regurgitation is  not visualized. No aortic stenosis is present.   6. The inferior vena cava is normal in size with greater than 50%  respiratory variability, suggesting right atrial pressure of 3 mmHg.   Myoview 07/25/2021   Findings are consistent with no ischemia and no prior myocardial infarction. The study is low risk.   No ST deviation was noted.   Left ventricular function is normal. Nuclear stress EF: 62 %. The left ventricular ejection fraction is normal (55-65%). End diastolic cavity size is normal.  ASSESSMENT AND PLAN:  1.  Persistent atrial fibrillation: Currently on Eliquis and metoprolol.   Multaq was recently stopped.  CHA2DS2-VASc of at least 3.  She is in rapid atrial fibrillation today.  Review of her histograms do show some elevated heart rates.  Amora Sheehy double her metoprolol to 100 mg twice daily.  She has a fracture to her right shoulder and is wearing a sling.  We Sallye Lunz give her information on both dofetilide and amiodarone.  Once the sling is off she Rakel Junio let us know if she wants to try and get back into normal rhythm.  2.  Hypertension: Currently well-controlled  3.  Post mitral valve repair: Plan per primary cardiology  4.  Secondary hypercoagulable state: Currently on Eliquis for atrial fibrillation  5.  Tachybradycardia syndrome: Status post Medtronic dual-chamber pacemaker implanted 03/17/2018.  Establishing care.  Sensing, threshold, impedance within normal limits.  Kadeisha Betsch arrange for device follow-up and remote monitoring through device clinic.    Current medicines are reviewed at length with the patient today.   The patient does not have concerns regarding her medicines.  The following changes were made today: Increase metoprolol  Labs/ tests ordered today include:  Orders Placed This Encounter  Procedures   CUP PACEART INCLINIC DEVICE CHECK     Disposition:   FU with Edsel Shives 6 months  Signed, Shelvia Fojtik Jorja Loa, MD  09/22/2022 11:42 AM     [facility] [address] [place] [address] [phone] (office) [phone] (fax)

## 2022-09-24 DIAGNOSIS — S42254A Nondisplaced fracture of greater tuberosity of right humerus, initial encounter for closed fracture: Secondary | ICD-10-CM | POA: Diagnosis not present

## 2022-10-02 DIAGNOSIS — N1832 Chronic kidney disease, stage 3b: Secondary | ICD-10-CM | POA: Diagnosis not present

## 2022-10-08 DIAGNOSIS — S42254A Nondisplaced fracture of greater tuberosity of right humerus, initial encounter for closed fracture: Secondary | ICD-10-CM | POA: Diagnosis not present

## 2022-10-14 DIAGNOSIS — M25511 Pain in right shoulder: Secondary | ICD-10-CM | POA: Diagnosis not present

## 2022-10-16 DIAGNOSIS — M25511 Pain in right shoulder: Secondary | ICD-10-CM | POA: Diagnosis not present

## 2022-10-20 DIAGNOSIS — M25511 Pain in right shoulder: Secondary | ICD-10-CM | POA: Diagnosis not present

## 2022-10-22 DIAGNOSIS — N1832 Chronic kidney disease, stage 3b: Secondary | ICD-10-CM | POA: Diagnosis not present

## 2022-10-22 DIAGNOSIS — M25511 Pain in right shoulder: Secondary | ICD-10-CM | POA: Diagnosis not present

## 2022-10-27 DIAGNOSIS — M25511 Pain in right shoulder: Secondary | ICD-10-CM | POA: Diagnosis not present

## 2022-10-28 DIAGNOSIS — I129 Hypertensive chronic kidney disease with stage 1 through stage 4 chronic kidney disease, or unspecified chronic kidney disease: Secondary | ICD-10-CM | POA: Diagnosis not present

## 2022-10-28 DIAGNOSIS — N1832 Chronic kidney disease, stage 3b: Secondary | ICD-10-CM | POA: Diagnosis not present

## 2022-10-28 DIAGNOSIS — N2581 Secondary hyperparathyroidism of renal origin: Secondary | ICD-10-CM | POA: Diagnosis not present

## 2022-10-28 DIAGNOSIS — D631 Anemia in chronic kidney disease: Secondary | ICD-10-CM | POA: Diagnosis not present

## 2022-10-29 DIAGNOSIS — M25511 Pain in right shoulder: Secondary | ICD-10-CM | POA: Diagnosis not present

## 2022-11-03 DIAGNOSIS — M25511 Pain in right shoulder: Secondary | ICD-10-CM | POA: Diagnosis not present

## 2022-11-05 DIAGNOSIS — M25511 Pain in right shoulder: Secondary | ICD-10-CM | POA: Diagnosis not present

## 2022-11-06 DIAGNOSIS — S42254A Nondisplaced fracture of greater tuberosity of right humerus, initial encounter for closed fracture: Secondary | ICD-10-CM | POA: Diagnosis not present

## 2022-11-10 DIAGNOSIS — M25511 Pain in right shoulder: Secondary | ICD-10-CM | POA: Diagnosis not present

## 2022-11-12 DIAGNOSIS — M25511 Pain in right shoulder: Secondary | ICD-10-CM | POA: Diagnosis not present

## 2022-11-17 DIAGNOSIS — M25511 Pain in right shoulder: Secondary | ICD-10-CM | POA: Diagnosis not present

## 2022-11-18 ENCOUNTER — Ambulatory Visit: Payer: Medicare PPO

## 2022-11-18 DIAGNOSIS — I495 Sick sinus syndrome: Secondary | ICD-10-CM | POA: Diagnosis not present

## 2022-11-18 LAB — CUP PACEART REMOTE DEVICE CHECK
Battery Remaining Longevity: 102 mo
Battery Voltage: 2.99 V
Brady Statistic RA Percent Paced: 0.18 %
Brady Statistic RV Percent Paced: 10.1 %
Date Time Interrogation Session: 20240730100053
Implantable Lead Connection Status: 753985
Implantable Lead Connection Status: 753985
Implantable Lead Implant Date: 20191127
Implantable Lead Implant Date: 20191127
Implantable Lead Location: 753859
Implantable Lead Location: 753860
Implantable Lead Model: 5076
Implantable Lead Model: 5076
Implantable Pulse Generator Implant Date: 20191127
Lead Channel Impedance Value: 304 Ohm
Lead Channel Impedance Value: 323 Ohm
Lead Channel Impedance Value: 361 Ohm
Lead Channel Impedance Value: 380 Ohm
Lead Channel Pacing Threshold Amplitude: 0.5 V
Lead Channel Pacing Threshold Amplitude: 0.625 V
Lead Channel Pacing Threshold Pulse Width: 0.4 ms
Lead Channel Pacing Threshold Pulse Width: 0.4 ms
Lead Channel Sensing Intrinsic Amplitude: 0.125 mV
Lead Channel Sensing Intrinsic Amplitude: 0.125 mV
Lead Channel Sensing Intrinsic Amplitude: 9.25 mV
Lead Channel Sensing Intrinsic Amplitude: 9.25 mV
Lead Channel Setting Pacing Amplitude: 1.5 V
Lead Channel Setting Pacing Amplitude: 1.5 V
Lead Channel Setting Pacing Pulse Width: 0.4 ms
Lead Channel Setting Sensing Sensitivity: 0.9 mV
Zone Setting Status: 755011
Zone Setting Status: 755011

## 2022-11-24 DIAGNOSIS — M25511 Pain in right shoulder: Secondary | ICD-10-CM | POA: Diagnosis not present

## 2022-11-28 ENCOUNTER — Telehealth: Payer: Self-pay | Admitting: *Deleted

## 2022-11-28 MED ORDER — DILTIAZEM HCL ER COATED BEADS 180 MG PO CP24: 180.0000 mg | ORAL_CAPSULE | Freq: Every day | ORAL | 6 refills | Status: DC

## 2022-11-28 NOTE — Telephone Encounter (Signed)
-----   Message from [facility] sent at 11/21/2022  2:31 PM EDT ----- Abnormal device interrogation reviewed.  Lead parameters and battery status stable.  AF but poor ventricular response.  Start diltiazem 180 mg daily.  Will need to discuss whether or not she wants to start on amiodarone or Tikosyn for A-fib control.

## 2022-11-28 NOTE — Telephone Encounter (Signed)
Left message to call back  

## 2022-11-28 NOTE — Telephone Encounter (Signed)
Informed patient of results and verbal understanding expressed. Diltiazem Rx sent to pharmacy  Advised to call office if SE occur after starting medication. Patient verbalized understanding and agreeable to plan.   States she is still healing from shoulder fracture.  It is a slow healing and she is currently doing therapy, "but this is taking some time".  She does not want to add any medication yet and she "does not want to deal with a shock right now"  (she was referring to DCCV).  She prefers to wait until follow up later this year. She is aware she is in constant afib recently.  Aware will forward to Dr  Elberta Fortis for his FYI.

## 2022-12-01 DIAGNOSIS — M25511 Pain in right shoulder: Secondary | ICD-10-CM | POA: Diagnosis not present

## 2022-12-04 DIAGNOSIS — M25511 Pain in right shoulder: Secondary | ICD-10-CM | POA: Diagnosis not present

## 2022-12-08 DIAGNOSIS — M25511 Pain in right shoulder: Secondary | ICD-10-CM | POA: Diagnosis not present

## 2022-12-08 NOTE — Telephone Encounter (Signed)
Pt aware of MD recommendation. She understands the AFib clinic will call her to arrange appt, and that it will be in [place]. Patient verbalized understanding and agreeable to plan.

## 2022-12-10 NOTE — Progress Notes (Signed)
Remote pacemaker transmission.   

## 2022-12-11 DIAGNOSIS — M25511 Pain in right shoulder: Secondary | ICD-10-CM | POA: Diagnosis not present

## 2022-12-15 DIAGNOSIS — M25511 Pain in right shoulder: Secondary | ICD-10-CM | POA: Diagnosis not present

## 2022-12-18 DIAGNOSIS — M25511 Pain in right shoulder: Secondary | ICD-10-CM | POA: Diagnosis not present

## 2022-12-23 DIAGNOSIS — Z780 Asymptomatic menopausal state: Secondary | ICD-10-CM | POA: Diagnosis not present

## 2022-12-23 DIAGNOSIS — E785 Hyperlipidemia, unspecified: Secondary | ICD-10-CM | POA: Diagnosis not present

## 2022-12-23 DIAGNOSIS — I5042 Chronic combined systolic (congestive) and diastolic (congestive) heart failure: Secondary | ICD-10-CM | POA: Diagnosis not present

## 2022-12-23 DIAGNOSIS — I4891 Unspecified atrial fibrillation: Secondary | ICD-10-CM | POA: Diagnosis not present

## 2022-12-23 DIAGNOSIS — M25511 Pain in right shoulder: Secondary | ICD-10-CM | POA: Diagnosis not present

## 2022-12-23 DIAGNOSIS — Z0000 Encounter for general adult medical examination without abnormal findings: Secondary | ICD-10-CM | POA: Diagnosis not present

## 2022-12-23 DIAGNOSIS — Z79899 Other long term (current) drug therapy: Secondary | ICD-10-CM | POA: Diagnosis not present

## 2022-12-23 DIAGNOSIS — I110 Hypertensive heart disease with heart failure: Secondary | ICD-10-CM | POA: Diagnosis not present

## 2022-12-23 DIAGNOSIS — R7302 Impaired glucose tolerance (oral): Secondary | ICD-10-CM | POA: Diagnosis not present

## 2022-12-23 DIAGNOSIS — N1832 Chronic kidney disease, stage 3b: Secondary | ICD-10-CM | POA: Diagnosis not present

## 2022-12-26 DIAGNOSIS — M25511 Pain in right shoulder: Secondary | ICD-10-CM | POA: Diagnosis not present

## 2022-12-29 DIAGNOSIS — M25511 Pain in right shoulder: Secondary | ICD-10-CM | POA: Diagnosis not present

## 2023-01-12 DIAGNOSIS — M25511 Pain in right shoulder: Secondary | ICD-10-CM | POA: Diagnosis not present

## 2023-01-20 DIAGNOSIS — N1832 Chronic kidney disease, stage 3b: Secondary | ICD-10-CM | POA: Diagnosis not present

## 2023-01-28 DIAGNOSIS — N1832 Chronic kidney disease, stage 3b: Secondary | ICD-10-CM | POA: Diagnosis not present

## 2023-01-28 DIAGNOSIS — N189 Chronic kidney disease, unspecified: Secondary | ICD-10-CM | POA: Diagnosis not present

## 2023-01-28 DIAGNOSIS — I129 Hypertensive chronic kidney disease with stage 1 through stage 4 chronic kidney disease, or unspecified chronic kidney disease: Secondary | ICD-10-CM | POA: Diagnosis not present

## 2023-01-28 DIAGNOSIS — D631 Anemia in chronic kidney disease: Secondary | ICD-10-CM | POA: Diagnosis not present

## 2023-01-28 DIAGNOSIS — N2581 Secondary hyperparathyroidism of renal origin: Secondary | ICD-10-CM | POA: Diagnosis not present

## 2023-02-09 ENCOUNTER — Ambulatory Visit (HOSPITAL_COMMUNITY)
Admission: RE | Admit: 2023-02-09 | Discharge: 2023-02-09 | Disposition: A | Discharge status: Home / Self Care | Payer: Medicare PPO | Source: Ambulatory Visit | Attending: Internal Medicine | Admitting: Internal Medicine

## 2023-02-09 VITALS — BP 176/100 | HR 66 | Ht 60.0 in | Wt 171.2 lb

## 2023-02-09 DIAGNOSIS — D6869 Other thrombophilia: Secondary | ICD-10-CM

## 2023-02-09 DIAGNOSIS — I4891 Unspecified atrial fibrillation: Secondary | ICD-10-CM

## 2023-02-09 DIAGNOSIS — I4819 Other persistent atrial fibrillation: Secondary | ICD-10-CM | POA: Diagnosis not present

## 2023-02-09 DIAGNOSIS — E785 Hyperlipidemia, unspecified: Secondary | ICD-10-CM | POA: Diagnosis not present

## 2023-02-09 DIAGNOSIS — Z7901 Long term (current) use of anticoagulants: Secondary | ICD-10-CM | POA: Diagnosis not present

## 2023-02-09 DIAGNOSIS — Z79899 Other long term (current) drug therapy: Secondary | ICD-10-CM | POA: Insufficient documentation

## 2023-02-09 DIAGNOSIS — I10 Essential (primary) hypertension: Secondary | ICD-10-CM | POA: Insufficient documentation

## 2023-02-09 DIAGNOSIS — I4821 Permanent atrial fibrillation: Secondary | ICD-10-CM | POA: Insufficient documentation

## 2023-02-09 NOTE — Progress Notes (Signed)
Primary Care Physician: Buckner Malta, MD Primary Cardiologist: None Electrophysiologist: None     Referring Physician: Dr. Lenell Antu is a 72 y.o. female with a history of HTN, HLD, aortic atherosclerosis by CT imaging, mitral valve repair with surgical maze and left atrial appendage clipping, tachybrady syndrome s/p Medtronic dual-chamber pacemaker in 2019, atrial flutter, and persistent atrial fibrillation who presents for consultation in the [facility]. She was previously on Multaq. Device interrogation by Dr. Elberta Fortis showed Afib with poor ventricular response and patient started on diltiazem 180 mg daily. Patient is on Eliquis for a CHADS2VASC score of 4.  On evaluation today, she is currently in Afib. She notes overall hesitation to proceed with AAD therapy. She has been compliant with Eliquis. She notes to really not feel bad when in Afib and also wonders what would happen if she did nothing to treat her Afib.   Today, she denies symptoms of palpitations, chest pain, shortness of breath, orthopnea, PND, lower extremity edema, dizziness, presyncope, syncope, snoring, daytime somnolence, bleeding, or neurologic sequela. The patient is tolerating medications without difficulties and is otherwise without complaint today.   she has a BMI of Body mass index is 33.44 kg/m.Marland Kitchen Filed Weights   02/09/23 0914  Weight: 77.7 kg    Current Outpatient Medications  Medication Sig Dispense Refill   acetaminophen (TYLENOL) 650 MG CR tablet Take 650 mg by mouth every 8 (eight) hours as needed for pain.     alendronate (FOSAMAX) 70 MG tablet Take 70 mg by mouth once a week.     amoxicillin (AMOXIL) 500 MG tablet Take 2 grams (4 tablets) by mouth once 30-60 minutes prior to dental procedure 4 tablet 0   apixaban (ELIQUIS) 5 MG TABS tablet Take 5 mg by mouth 2 (two) times a day.      Ascorbic Acid (VITAMIN C) 1000 MG tablet Take 1,000 mg by mouth  daily.     atorvastatin (LIPITOR) 40 MG tablet Take 40 mg by mouth every evening.      Calcium Carbonate-Vit D-Min (CALCIUM 1200 PO) Take 1 tablet by mouth daily.     cholecalciferol (VITAMIN D3) 25 MCG (1000 UNIT) tablet Take 1,000 Units by mouth daily.     dapagliflozin propanediol (FARXIGA) 10 MG TABS tablet Take 10 mg by mouth daily.     diltiazem (CARDIZEM CD) 180 MG 24 hr capsule Take 1 capsule (180 mg total) by mouth daily. 30 capsule 6   furosemide (LASIX) 20 MG tablet Take 20 mg by mouth as needed.     metoprolol tartrate (LOPRESSOR) 100 MG tablet Take 1 tablet (100 mg total) by mouth 2 (two) times daily. 180 tablet 3   telmisartan (MICARDIS) 80 MG tablet Take 80 mg by mouth daily.     No current facility-administered medications for this encounter.    Atrial Fibrillation Management history:  Previous antiarrhythmic drugs: Multaq Previous cardioversions: 07/09/22 Previous ablations: none Anticoagulation history: Eliquis   ROS- All systems are reviewed and negative except as per the HPI above.  Physical Exam: BP (!) 176/100   Pulse 66   Ht 5' (1.524 m)   Wt 77.7 kg   BMI 33.44 kg/m   GEN: Well nourished, well developed in no acute distress NECK: No JVD; No carotid bruits CARDIAC: Irregularly irregular rate and rhythm, no murmurs, rubs, gallops RESPIRATORY:  Clear to auscultation without rales, wheezing or rhonchi  ABDOMEN: Soft, non-tender, non-distended EXTREMITIES:  No  edema; No deformity   EKG today demonstrates  Vent. rate 66 BPM PR interval * ms QRS duration 74 ms QT/QTcB 432/452 ms P-R-T axes * 96 269 Atrial fibrillation with frequent ventricular-paced complexes Rightward axis ST & T wave abnormality, consider inferior ischemia ST & T wave abnormality, consider anterolateral ischemia Abnormal ECG When compared with ECG of 01-Apr-2020 11:05, QT has shortened Confirmed by Izora Ribas, Mahesh ([id number]) on 02/09/2023 10:01:48 AM  Echo 07/24/21 demonstrated    1. Left ventricular ejection fraction, by estimation, is 50 to 55%. The  left ventricle has low normal function. The left ventricle has no regional  wall motion abnormalities. Left ventricular diastolic parameters are  consistent with Grade II diastolic  dysfunction (pseudonormalization).   2. Right ventricular systolic function is normal. The right ventricular  size is normal. There is mildly elevated pulmonary artery systolic  pressure.   3. Left atrial size was severely dilated.   4. Mitral Valve Repair with 30 mm Physio ring annuloplasty and leaflet  repair, Cox-MAZE procedure with RF ablation and Cryo Left and Right sided  lesion sets, ligation of the atrial appendage, left atrial repair and  reconstruction with bovine  pericardium.. The mitral valve has been repaired/replaced. Mild mitral  valve regurgitation. The mean mitral valve gradient is 3.0 mmHg. Procedure  Date: 2017.   5. The aortic valve is normal in structure. Aortic valve regurgitation is  not visualized. No aortic stenosis is present.   6. The inferior vena cava is normal in size with greater than 50%  respiratory variability, suggesting right atrial pressure of 3 mmHg.   ASSESSMENT & PLAN CHA2DS2-VASc Score = 4  The patient's score is based upon: CHF History: 0 HTN History: 1 Diabetes History: 0 Stroke History: 0 Vascular Disease History: 1 Age Score: 1 Gender Score: 1       ASSESSMENT AND PLAN: Persistent Atrial Fibrillation (ICD10:  I48.19) The patient's CHA2DS2-VASc score is 4, indicating a 4.8% annual risk of stroke.    She is currently in rate controlled Afib.  We discussed amiodarone and Tikosyn, their possible adverse effects and benefits, and monitoring required. I mentioned at her current age she may wish to choose Tikosyn over amiodarone due to potential length of therapy / risk of adverse effects. We visited the topic of rate control / permanent Afib briefly. She also mentioned about doing an  ablation in the future. We discussed the hospital admission for Tikosyn, she has no clear contraindication from medication list, and would have to stop [place] for several days prior to admission. Patient will contact insurance for pricing and call us back with decision.   NSR 448 ms. CrCl 49 mL/min.  Secondary Hypercoagulable State (ICD10:  D68.69) The patient is at significant risk for stroke/thromboembolism based upon her CHA2DS2-VASc Score of 4.  Continue Apixaban (Eliquis).    Patient will call clinic with medication decision.    Lake Bells, PA-C  Afib [facility] [address] [place], [place] [address] [phone]

## 2023-02-17 ENCOUNTER — Ambulatory Visit (INDEPENDENT_AMBULATORY_CARE_PROVIDER_SITE_OTHER): Payer: Medicare PPO

## 2023-02-17 DIAGNOSIS — I495 Sick sinus syndrome: Secondary | ICD-10-CM

## 2023-02-17 DIAGNOSIS — I4819 Other persistent atrial fibrillation: Secondary | ICD-10-CM

## 2023-02-19 LAB — CUP PACEART REMOTE DEVICE CHECK
Battery Remaining Longevity: 97 mo
Battery Voltage: 2.98 V
Brady Statistic RA Percent Paced: 0.82 %
Brady Statistic RV Percent Paced: 43.97 %
Date Time Interrogation Session: 20241029053229
Implantable Lead Connection Status: 753985
Implantable Lead Connection Status: 753985
Implantable Lead Implant Date: 20191127
Implantable Lead Implant Date: 20191127
Implantable Lead Location: 753859
Implantable Lead Location: 753860
Implantable Lead Model: 5076
Implantable Lead Model: 5076
Implantable Pulse Generator Implant Date: 20191127
Lead Channel Impedance Value: 323 Ohm
Lead Channel Impedance Value: 323 Ohm
Lead Channel Impedance Value: 361 Ohm
Lead Channel Impedance Value: 437 Ohm
Lead Channel Pacing Threshold Amplitude: 0.5 V
Lead Channel Pacing Threshold Amplitude: 0.625 V
Lead Channel Pacing Threshold Pulse Width: 0.4 ms
Lead Channel Pacing Threshold Pulse Width: 0.4 ms
Lead Channel Sensing Intrinsic Amplitude: 0.125 mV
Lead Channel Sensing Intrinsic Amplitude: 0.125 mV
Lead Channel Sensing Intrinsic Amplitude: 9.5 mV
Lead Channel Sensing Intrinsic Amplitude: 9.5 mV
Lead Channel Setting Pacing Amplitude: 1.5 V
Lead Channel Setting Pacing Amplitude: 1.5 V
Lead Channel Setting Pacing Pulse Width: 0.4 ms
Lead Channel Setting Sensing Sensitivity: 0.9 mV
Zone Setting Status: 755011
Zone Setting Status: 755011

## 2023-03-09 NOTE — Progress Notes (Signed)
Remote pacemaker transmission.   

## 2023-03-24 DIAGNOSIS — I4891 Unspecified atrial fibrillation: Secondary | ICD-10-CM | POA: Diagnosis not present

## 2023-03-24 DIAGNOSIS — Z6832 Body mass index (BMI) 32.0-32.9, adult: Secondary | ICD-10-CM | POA: Diagnosis not present

## 2023-03-24 DIAGNOSIS — I5042 Chronic combined systolic (congestive) and diastolic (congestive) heart failure: Secondary | ICD-10-CM | POA: Diagnosis not present

## 2023-03-24 DIAGNOSIS — I110 Hypertensive heart disease with heart failure: Secondary | ICD-10-CM | POA: Diagnosis not present

## 2023-03-24 DIAGNOSIS — N1832 Chronic kidney disease, stage 3b: Secondary | ICD-10-CM | POA: Diagnosis not present

## 2023-03-25 DIAGNOSIS — Z23 Encounter for immunization: Secondary | ICD-10-CM | POA: Diagnosis not present

## 2023-04-11 DIAGNOSIS — R509 Fever, unspecified: Secondary | ICD-10-CM | POA: Diagnosis not present

## 2023-04-11 DIAGNOSIS — J22 Unspecified acute lower respiratory infection: Secondary | ICD-10-CM | POA: Diagnosis not present

## 2023-04-21 DIAGNOSIS — R319 Hematuria, unspecified: Secondary | ICD-10-CM | POA: Diagnosis not present

## 2023-05-05 DIAGNOSIS — Z6831 Body mass index (BMI) 31.0-31.9, adult: Secondary | ICD-10-CM | POA: Diagnosis not present

## 2023-05-05 DIAGNOSIS — M79662 Pain in left lower leg: Secondary | ICD-10-CM | POA: Diagnosis not present

## 2023-05-19 ENCOUNTER — Ambulatory Visit (INDEPENDENT_AMBULATORY_CARE_PROVIDER_SITE_OTHER): Payer: Medicare PPO

## 2023-05-19 DIAGNOSIS — I495 Sick sinus syndrome: Secondary | ICD-10-CM

## 2023-05-20 LAB — CUP PACEART REMOTE DEVICE CHECK
Battery Remaining Longevity: 90 mo
Battery Voltage: 2.98 V
Brady Statistic RA Percent Paced: 0.92 %
Brady Statistic RV Percent Paced: 38.39 %
Date Time Interrogation Session: 20250128003024
Implantable Lead Connection Status: 753985
Implantable Lead Connection Status: 753985
Implantable Lead Implant Date: 20191127
Implantable Lead Implant Date: 20191127
Implantable Lead Location: 753859
Implantable Lead Location: 753860
Implantable Lead Model: 5076
Implantable Lead Model: 5076
Implantable Pulse Generator Implant Date: 20191127
Lead Channel Impedance Value: 323 Ohm
Lead Channel Impedance Value: 342 Ohm
Lead Channel Impedance Value: 380 Ohm
Lead Channel Impedance Value: 399 Ohm
Lead Channel Pacing Threshold Amplitude: 0.5 V
Lead Channel Pacing Threshold Amplitude: 0.75 V
Lead Channel Pacing Threshold Pulse Width: 0.4 ms
Lead Channel Pacing Threshold Pulse Width: 0.4 ms
Lead Channel Sensing Intrinsic Amplitude: 0.25 mV
Lead Channel Sensing Intrinsic Amplitude: 0.25 mV
Lead Channel Sensing Intrinsic Amplitude: 10.875 mV
Lead Channel Sensing Intrinsic Amplitude: 10.875 mV
Lead Channel Setting Pacing Amplitude: 1.5 V
Lead Channel Setting Pacing Amplitude: 1.5 V
Lead Channel Setting Pacing Pulse Width: 0.4 ms
Lead Channel Setting Sensing Sensitivity: 0.9 mV
Zone Setting Status: 755011
Zone Setting Status: 755011

## 2023-05-22 ENCOUNTER — Encounter (HOSPITAL_COMMUNITY): Payer: Self-pay

## 2023-05-22 ENCOUNTER — Emergency Department (HOSPITAL_COMMUNITY): Payer: Medicare PPO

## 2023-05-22 ENCOUNTER — Emergency Department (HOSPITAL_COMMUNITY)
Admission: EM | Admit: 2023-05-22 | Discharge: 2023-05-22 | Disposition: A | Discharge status: Home / Self Care | Payer: Medicare PPO | Attending: Emergency Medicine | Admitting: Emergency Medicine

## 2023-05-22 ENCOUNTER — Other Ambulatory Visit: Payer: Self-pay

## 2023-05-22 DIAGNOSIS — Z90710 Acquired absence of both cervix and uterus: Secondary | ICD-10-CM | POA: Diagnosis not present

## 2023-05-22 DIAGNOSIS — N3289 Other specified disorders of bladder: Secondary | ICD-10-CM | POA: Diagnosis not present

## 2023-05-22 DIAGNOSIS — Z9049 Acquired absence of other specified parts of digestive tract: Secondary | ICD-10-CM | POA: Diagnosis not present

## 2023-05-22 DIAGNOSIS — Z79899 Other long term (current) drug therapy: Secondary | ICD-10-CM | POA: Diagnosis not present

## 2023-05-22 DIAGNOSIS — R310 Gross hematuria: Secondary | ICD-10-CM | POA: Insufficient documentation

## 2023-05-22 DIAGNOSIS — I10 Essential (primary) hypertension: Secondary | ICD-10-CM | POA: Diagnosis not present

## 2023-05-22 DIAGNOSIS — Z7901 Long term (current) use of anticoagulants: Secondary | ICD-10-CM | POA: Diagnosis not present

## 2023-05-22 DIAGNOSIS — Z8673 Personal history of transient ischemic attack (TIA), and cerebral infarction without residual deficits: Secondary | ICD-10-CM | POA: Diagnosis not present

## 2023-05-22 DIAGNOSIS — R319 Hematuria, unspecified: Secondary | ICD-10-CM | POA: Diagnosis present

## 2023-05-22 LAB — URINALYSIS, ROUTINE W REFLEX MICROSCOPIC: RBC / HPF: >50 RBC/hpf (ref 0–5)

## 2023-05-22 LAB — CBC
HCT: 42.8 % (ref 36.0–46.0)
Hemoglobin: 14.1 g/dL (ref 12.0–15.0)
MCH: 30.6 pg (ref 26.0–34.0)
MCHC: 32.9 g/dL (ref 30.0–36.0)
MCV: 92.8 fL (ref 80.0–100.0)
Platelets: 191 10*3/uL (ref 150–400)
RBC: 4.61 MIL/uL (ref 3.87–5.11)
RDW: 15 % (ref 11.5–15.5)
WBC: 10.6 10*3/uL — ABNORMAL HIGH (ref 4.0–10.5)
nRBC: 0 % (ref 0.0–0.2)

## 2023-05-22 LAB — BASIC METABOLIC PANEL
Anion gap: 13 (ref 5–15)
BUN: 25 mg/dL — ABNORMAL HIGH (ref 8–23)
CO2: 25 mmol/L (ref 22–32)
Calcium: 9.6 mg/dL (ref 8.9–10.3)
Chloride: 107 mmol/L (ref 98–111)
Creatinine, Ser: 1.45 mg/dL — ABNORMAL HIGH (ref 0.44–1.00)
GFR, Estimated: 39 mL/min — ABNORMAL LOW (ref >60)
Glucose, Bld: 100 mg/dL — ABNORMAL HIGH (ref 70–99)
Potassium: 3.8 mmol/L (ref 3.5–5.1)
Sodium: 145 mmol/L (ref 135–145)

## 2023-05-22 MED ORDER — CEPHALEXIN 250 MG PO CAPS: 250.0000 mg | ORAL_CAPSULE | Freq: Four times a day (QID) | ORAL | 0 refills | Status: DC

## 2023-05-22 NOTE — ED Provider Notes (Signed)
[place] EMERGENCY DEPARTMENT AT [facility] Provider Note   CSN: [id number] Arrival date & time: 05/22/23  4098     History {Add pertinent medical, surgical, social history, OB history to HPI:1} Chief Complaint  Patient presents with   Hematuria    Isabel Hardy is a 73 y.o. female.   Hematuria   Patient has a history of mitral valve repair hypertension stroke atrial fibrillation hypertension acute renal failure who presents ED for evaluation of hematuria.  Patient states she started noticing blood in her urine few weeks ago.  Patient was started on a course of antibiotics by her kidney doctor.  Eventually the hematuria resolved.  Patient not really sure if the antibiotics helped or went away on its own.  Patient states she had been doing well until a couple days ago when she started having blood in her urine again.  Patient states it is more than it was before.  She is not having any pain.  She denies any urinary frequency.  She denies any dysuria.  Patient is on Eliquis    Home Medications Prior to Admission medications   Medication Sig Start Date End Date Taking? Authorizing Provider  cephALEXin (KEFLEX) 250 MG capsule Take 1 capsule (250 mg total) by mouth 4 (four) times daily. 05/22/23  Yes Linwood Dibbles, MD  acetaminophen (TYLENOL) 650 MG CR tablet Take 650 mg by mouth every 8 (eight) hours as needed for pain.    [provider]  alendronate (FOSAMAX) 70 MG tablet Take 70 mg by mouth once a week. 01/06/23   [provider]  ALPRAZolam (XANAX XR) 0.5 MG 24 hr tablet Take 0.5 mg by mouth daily as needed. 02/26/23   [provider]  amoxicillin (AMOXIL) 500 MG tablet Take 2 grams (4 tablets) by mouth once 30-60 minutes prior to dental procedure 01/27/22   Revankar, Aundra Dubin, MD  apixaban (ELIQUIS) 5 MG TABS tablet Take 5 mg by mouth 2 (two) times a day.  06/01/18   [provider]  Ascorbic Acid (VITAMIN C) 1000 MG tablet Take 1,000 mg  by mouth daily.    [provider]  atorvastatin (LIPITOR) 40 MG tablet Take 40 mg by mouth every evening.  06/15/15   [provider]  Calcium Carbonate-Vit D-Min (CALCIUM 1200 PO) Take 1 tablet by mouth daily.    [provider]  cholecalciferol (VITAMIN D3) 25 MCG (1000 UNIT) tablet Take 1,000 Units by mouth daily.    [provider]  ciprofloxacin (CIPRO) 250 MG tablet Take 250 mg by mouth 2 (two) times daily. 04/24/23   [provider]  dapagliflozin propanediol (FARXIGA) 10 MG TABS tablet Take 10 mg by mouth daily.    [provider]  diltiazem (CARDIZEM CD) 180 MG 24 hr capsule Take 1 capsule (180 mg total) by mouth daily. 11/28/22   Camnitz, Andree Coss, MD  furosemide (LASIX) 20 MG tablet Take 20 mg by mouth as needed. 04/21/19   [provider]  metoprolol tartrate (LOPRESSOR) 100 MG tablet Take 1 tablet (100 mg total) by mouth 2 (two) times daily. 09/22/22 02/09/23  Camnitz, Andree Coss, MD  metoprolol tartrate (LOPRESSOR) 50 MG tablet Take 50 mg by mouth 2 (two) times daily. 04/04/23   [provider]  predniSONE (STERAPRED UNI-PAK 21 TAB) 10 MG (21) TBPK tablet Take by mouth as directed. 05/07/23   [provider]  telmisartan (MICARDIS) 80 MG tablet Take 80 mg by mouth daily.  [provider]      Allergies    Patient has no known allergies.    Review of Systems   Review of Systems  Genitourinary:  Positive for hematuria.    Physical Exam Updated Vital Signs BP (!) 154/86 (BP Location: Left Arm)   Pulse 65   Temp 98 F (36.7 C) (Oral)   Resp 18   Ht 1.524 m (5')   Wt 77.1 kg   SpO2 99%   BMI 33.20 kg/m  Physical Exam Vitals and nursing note reviewed.  Constitutional:      General: She is not in acute distress.    Appearance: She is well-developed.  HENT:     Head: Normocephalic and atraumatic.     Right Ear: External ear normal.     Left Ear: External ear normal.  Eyes:      General: No scleral icterus.       Right eye: No discharge.        Left eye: No discharge.     Conjunctiva/sclera: Conjunctivae normal.  Neck:     Trachea: No tracheal deviation.  Cardiovascular:     Rate and Rhythm: Normal rate and regular rhythm.  Pulmonary:     Effort: Pulmonary effort is normal. No respiratory distress.     Breath sounds: Normal breath sounds. No stridor. No wheezing or rales.  Abdominal:     General: Bowel sounds are normal. There is no distension.     Palpations: Abdomen is soft.     Tenderness: There is no abdominal tenderness. There is no guarding or rebound.  Musculoskeletal:        General: No tenderness or deformity.     Cervical back: Neck supple.  Skin:    General: Skin is warm and dry.     Findings: No rash.  Neurological:     General: No focal deficit present.     Mental Status: She is alert.     Cranial Nerves: No cranial nerve deficit, dysarthria or facial asymmetry.     Sensory: No sensory deficit.     Motor: No abnormal muscle tone or seizure activity.     Coordination: Coordination normal.  Psychiatric:        Mood and Affect: Mood normal.     ED Results / Procedures / Treatments   Labs (all labs ordered are listed, but only abnormal results are displayed) Labs Reviewed  BASIC METABOLIC PANEL - Abnormal; Notable for the following components:      Result Value   Glucose, Bld 100 (*)    BUN 25 (*)    Creatinine, Ser 1.45 (*)    GFR, Estimated 39 (*)    All other components within normal limits  CBC - Abnormal; Notable for the following components:   WBC 10.6 (*)    All other components within normal limits  URINALYSIS, ROUTINE W REFLEX MICROSCOPIC - Abnormal; Notable for the following components:   Color, Urine RED (*)    APPearance HAZY (*)    Glucose, UA   (*)    Value: TEST NOT REPORTED DUE TO COLOR INTERFERENCE OF URINE PIGMENT   Hgb urine dipstick   (*)    Value: TEST NOT REPORTED DUE TO COLOR INTERFERENCE OF URINE PIGMENT    Bilirubin Urine   (*)    Value: TEST NOT REPORTED DUE TO COLOR INTERFERENCE OF URINE PIGMENT   Ketones, ur   (*)    Value: TEST NOT REPORTED DUE TO COLOR INTERFERENCE OF  URINE PIGMENT   Protein, ur   (*)    Value: TEST NOT REPORTED DUE TO COLOR INTERFERENCE OF URINE PIGMENT   Nitrite   (*)    Value: TEST NOT REPORTED DUE TO COLOR INTERFERENCE OF URINE PIGMENT   Leukocytes,Ua   (*)    Value: TEST NOT REPORTED DUE TO COLOR INTERFERENCE OF URINE PIGMENT   Bacteria, UA RARE (*)    All other components within normal limits  URINE CULTURE    EKG None  Radiology CT Renal Stone Study Result Date: 05/22/2023 CLINICAL DATA:  gross hematuria. EXAM: CT ABDOMEN AND PELVIS WITHOUT CONTRAST TECHNIQUE: Multidetector CT imaging of the abdomen and pelvis was performed following the standard protocol without IV contrast. RADIATION DOSE REDUCTION: This exam was performed according to the departmental dose-optimization program which includes automated exposure control, adjustment of the mA and/or kV according to patient size and/or use of iterative reconstruction technique. COMPARISON:  CT scan abdomen and pelvis from 04/01/2020. FINDINGS: Lower chest: There are patchy atelectatic changes in the visualized lung bases. No overt consolidation. No pleural effusion. The heart is normal in size. No pericardial effusion. Prosthetic mitral valve noted. Partially seen pacemaker lead. Hepatobiliary: The liver is normal in size. Non-cirrhotic configuration. No suspicious mass. No intrahepatic or extrahepatic bile duct dilation. Gallbladder is surgically absent. Pancreas: Unremarkable. No pancreatic ductal dilatation or surrounding inflammatory changes. Spleen: Within normal limits. No focal lesion. Adrenals/Urinary Tract: Adrenal glands are unremarkable. No suspicious renal mass within the limitations of this exam. Note is made of small/atrophic right kidney. No nephroureterolithiasis or obstructive uropathy on either side.  Urinary bladder is under distended, precluding optimal assessment. However, no large mass or stones identified. No perivesical fat stranding. Stomach/Bowel: No disproportionate dilation of the small or large bowel loops. No evidence of abnormal bowel wall thickening or inflammatory changes. The appendix was not visualized; however there is no acute inflammatory process in the right lower quadrant. Vascular/Lymphatic: No ascites or pneumoperitoneum. No abdominal or pelvic lymphadenopathy, by size criteria. No aneurysmal dilation of the major abdominal arteries. There are moderate peripheral atherosclerotic vascular calcifications of the aorta and its major branches. Reproductive: The uterus is surgically absent. No large adnexal mass. Other: There is a well-circumscribed 3.2 x 3.8 cm fluid attenuation structure in the subcutaneous fat over the right midabdomen, laterally, incompletely characterized but similar to the prior study and favored benign. The soft tissues and abdominal wall are otherwise unremarkable. Musculoskeletal: No suspicious osseous lesions. There are moderate multilevel degenerative changes in the visualized spine. IMPRESSION: 1. No nephroureterolithiasis or obstructive uropathy. 2. Multiple other nonacute observations, as described above. Electronically Signed   By: Jules Schick M.D.   On: 05/22/2023 13:59    Procedures Procedures  {Document cardiac monitor, telemetry assessment procedure when appropriate:1}  Medications Ordered in ED Medications - No data to display  ED Course/ Medical Decision Making/ A&P Clinical Course as of 05/22/23 1431  Fri May 22, 2023  1254 CBC(!) Wbc elevated, normal hgb [JK]  1255 Basic metabolic panel(!) Creatinine similar to previous values [JK]  1411 CT scan does not show any evidence of kidney stones or obstruction.  No renal masses noted [JK]    Clinical Course User Index [JK] Linwood Dibbles, MD   {   Click here for ABCD2, HEART and other  calculatorsREFRESH Note before signing :1}  Medical Decision Making Problems Addressed: Gross hematuria: acute illness or injury that poses a threat to life or bodily functions  Amount and/or Complexity of Data Reviewed Labs: ordered. Decision-making details documented in ED Course. Radiology: ordered and independent interpretation performed.   Patient presented to the ED for evaluation of hematuria.  Patient states she previously was placed on antibiotics.  It was not clear at that time that she had an infection.  Patient's not having any dysuria or infectious symptoms however she does have recurrent hematuria.  Patient's urinalysis does show greater than 50 red blood cells.  She does have 21-50 white blood cells rare bacteria.  Not clear that this infection but will cover with antibiotics.  With her gross hematuria I will have her hold her Eliquis for the next day or 2.  Patient does not have a history of PE or DVT.  She does not have a prosthetic heart valve.  Discussed risk benefit and I think with her having this active bleeding it is reasonable to hold her Eliquis which she is taking for stroke prevention in the setting of atrial fibrillation.  Will have her follow-up with urology for further evaluation  {Document critical care time when appropriate:1} {Document review of labs and clinical decision tools ie heart score, Chads2Vasc2 etc:1}  {Document your independent review of radiology images, and any outside records:1} {Document your discussion with family members, caretakers, and with consultants:1} {Document social determinants of health affecting pt's care:1} {Document your decision making why or why not admission, treatments were needed:1} Final Clinical Impression(s) / ED Diagnoses Final diagnoses:  Gross hematuria    Rx / DC Orders ED Discharge Orders          Ordered    cephALEXin (KEFLEX) 250 MG capsule  4 times daily        05/22/23 1430

## 2023-05-22 NOTE — Discharge Instructions (Addendum)
Hold your Eliquis for the next day or 2 as long as you are having the persistent blood in your urine to help resolve the bleeding.  Resume your medication tomorrow if the bleeding has stopped.  If it continues I would still recommend starting back on Sunday.  Return to the emergency room if you start having worsening symptoms weakness.  Follow-up with a urologist as we discussed for further evaluation  Take the antibiotics to treat possible infection.  We we will send off a culture today that should result in the next couple of days

## 2023-05-22 NOTE — ED Triage Notes (Addendum)
Patient reports blood in urine x 3 days. Patient denies flank pain, dysuria, and fevers. Patient is on eliquis. Said she had a similar episode earlier in the month.

## 2023-05-23 LAB — URINE CULTURE: Culture: NO GROWTH

## 2023-05-29 DIAGNOSIS — R310 Gross hematuria: Secondary | ICD-10-CM | POA: Diagnosis not present

## 2023-06-29 NOTE — Progress Notes (Signed)
 Remote pacemaker transmission.

## 2023-06-29 NOTE — Addendum Note (Signed)
 Addended by: Geralyn Flash D on: 06/29/2023 02:22 PM   Modules accepted: Orders

## 2023-07-07 DIAGNOSIS — I110 Hypertensive heart disease with heart failure: Secondary | ICD-10-CM | POA: Diagnosis not present

## 2023-07-07 DIAGNOSIS — M85852 Other specified disorders of bone density and structure, left thigh: Secondary | ICD-10-CM | POA: Diagnosis not present

## 2023-07-07 DIAGNOSIS — I5042 Chronic combined systolic (congestive) and diastolic (congestive) heart failure: Secondary | ICD-10-CM | POA: Diagnosis not present

## 2023-07-07 DIAGNOSIS — D692 Other nonthrombocytopenic purpura: Secondary | ICD-10-CM | POA: Diagnosis not present

## 2023-07-07 DIAGNOSIS — M7062 Trochanteric bursitis, left hip: Secondary | ICD-10-CM | POA: Diagnosis not present

## 2023-07-07 DIAGNOSIS — I4891 Unspecified atrial fibrillation: Secondary | ICD-10-CM | POA: Diagnosis not present

## 2023-07-07 DIAGNOSIS — N1832 Chronic kidney disease, stage 3b: Secondary | ICD-10-CM | POA: Diagnosis not present

## 2023-07-07 DIAGNOSIS — E559 Vitamin D deficiency, unspecified: Secondary | ICD-10-CM | POA: Diagnosis not present

## 2023-07-28 ENCOUNTER — Telehealth: Payer: Self-pay | Admitting: Cardiology

## 2023-07-28 MED ORDER — DILTIAZEM HCL ER COATED BEADS 180 MG PO CP24: 180.0000 mg | ORAL_CAPSULE | Freq: Every day | ORAL | 2 refills | Status: DC

## 2023-07-28 NOTE — Telephone Encounter (Signed)
*  STAT* If patient is at the pharmacy, call can be transferred to refill team.   1. Which medications need to be refilled? (please list name of each medication and dose if known) Diltiazem   2. Would you like to learn more about the convenience, safety, & potential cost savings by using the [facility]?    3. Are you open to using the [facility] (Type [facility].    4. Which pharmacy/location (including street and city if local pharmacy) is medication to be sent to?[facility] [address] [place]   5. Do they need a 30 day or 90 day supply? 90 days and refills- please call in today- out of medicine

## 2023-07-28 NOTE — Telephone Encounter (Signed)
This is a A-Fib clinic pt 

## 2023-07-28 NOTE — Telephone Encounter (Signed)
Refills submitted to pharmacy

## 2023-08-12 DIAGNOSIS — N1832 Chronic kidney disease, stage 3b: Secondary | ICD-10-CM | POA: Diagnosis not present

## 2023-08-18 ENCOUNTER — Ambulatory Visit (INDEPENDENT_AMBULATORY_CARE_PROVIDER_SITE_OTHER): Payer: Medicare PPO

## 2023-08-18 DIAGNOSIS — I495 Sick sinus syndrome: Secondary | ICD-10-CM

## 2023-08-18 LAB — CUP PACEART REMOTE DEVICE CHECK
Battery Remaining Longevity: 90 mo
Battery Voltage: 2.97 V
Brady Statistic RA Percent Paced: 1.18 %
Brady Statistic RV Percent Paced: 37.22 %
Date Time Interrogation Session: 20250429070601
Implantable Lead Connection Status: 753985
Implantable Lead Connection Status: 753985
Implantable Lead Implant Date: 20191127
Implantable Lead Implant Date: 20191127
Implantable Lead Location: 753859
Implantable Lead Location: 753860
Implantable Lead Model: 5076
Implantable Lead Model: 5076
Implantable Pulse Generator Implant Date: 20191127
Lead Channel Impedance Value: 342 Ohm
Lead Channel Impedance Value: 342 Ohm
Lead Channel Impedance Value: 380 Ohm
Lead Channel Impedance Value: 418 Ohm
Lead Channel Pacing Threshold Amplitude: 0.5 V
Lead Channel Pacing Threshold Amplitude: 0.625 V
Lead Channel Pacing Threshold Pulse Width: 0.4 ms
Lead Channel Pacing Threshold Pulse Width: 0.4 ms
Lead Channel Sensing Intrinsic Amplitude: 0.25 mV
Lead Channel Sensing Intrinsic Amplitude: 0.25 mV
Lead Channel Sensing Intrinsic Amplitude: 10.5 mV
Lead Channel Sensing Intrinsic Amplitude: 10.5 mV
Lead Channel Setting Pacing Amplitude: 1.5 V
Lead Channel Setting Pacing Amplitude: 1.5 V
Lead Channel Setting Pacing Pulse Width: 0.4 ms
Lead Channel Setting Sensing Sensitivity: 0.9 mV
Zone Setting Status: 755011
Zone Setting Status: 755011

## 2023-08-19 DIAGNOSIS — I129 Hypertensive chronic kidney disease with stage 1 through stage 4 chronic kidney disease, or unspecified chronic kidney disease: Secondary | ICD-10-CM | POA: Diagnosis not present

## 2023-08-19 DIAGNOSIS — N2581 Secondary hyperparathyroidism of renal origin: Secondary | ICD-10-CM | POA: Diagnosis not present

## 2023-08-19 DIAGNOSIS — N1832 Chronic kidney disease, stage 3b: Secondary | ICD-10-CM | POA: Diagnosis not present

## 2023-08-19 DIAGNOSIS — D631 Anemia in chronic kidney disease: Secondary | ICD-10-CM | POA: Diagnosis not present

## 2023-09-09 ENCOUNTER — Ambulatory Visit: Payer: Self-pay | Admitting: *Deleted

## 2023-09-09 NOTE — Telephone Encounter (Signed)
-----   Message from [facility] sent at 08/18/2023  4:48 PM EDT ----- Remote pacemaker interrogation. Presenting Rhythm:A-paced V-sensed. Battery and lead parameters stable with stable capture and sensing. Device programming is appropriate. Continue remote monitoring.  Needs follow up to discuss rhythm control with ep app

## 2023-09-09 NOTE — Telephone Encounter (Signed)
 Forwarding to scheduler to arrange office visit in afib clinic or ep app

## 2023-09-16 ENCOUNTER — Encounter (HOSPITAL_COMMUNITY): Payer: Self-pay | Admitting: Internal Medicine

## 2023-09-16 ENCOUNTER — Ambulatory Visit (HOSPITAL_COMMUNITY)
Admission: RE | Admit: 2023-09-16 | Discharge: 2023-09-16 | Disposition: A | Discharge status: Home / Self Care | Source: Ambulatory Visit | Attending: Internal Medicine | Admitting: Internal Medicine

## 2023-09-16 VITALS — BP 140/90 | HR 65 | Ht 60.0 in | Wt 171.2 lb

## 2023-09-16 DIAGNOSIS — D6869 Other thrombophilia: Secondary | ICD-10-CM | POA: Diagnosis not present

## 2023-09-16 DIAGNOSIS — I4819 Other persistent atrial fibrillation: Secondary | ICD-10-CM

## 2023-09-16 NOTE — Progress Notes (Addendum)
 Primary Care Physician: Harvest Lineman, MD Primary Cardiologist: None Electrophysiologist: None     Referring Physician: Dr. [name] is a 73 y.o. 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 She was previously on Multaq . Device interrogation by Dr. Lawana Pray showed Afib with poor ventricular response and patient started on diltiazem  180 mg daily. Patient is on Eliquis  for a CHADS2VASC score of 4.  On evaluation today, she is currently in Afib. She notes overall hesitation to proceed with AAD therapy. She has been compliant with Eliquis . She notes to really not feel bad when in Afib and also wonders what would happen if she did nothing to treat her Afib.   On follow up 09/16/23, she is currently in Afib. Recent device interrogation showed persistent Afib with controlled rates. She has not missed any doses of Eliquis .  Today, she denies symptoms of palpitations, chest pain, shortness of breath, orthopnea, PND, lower extremity edema, dizziness, presyncope, syncope, snoring, daytime somnolence, bleeding, or neurologic sequela. The patient is tolerating medications without difficulties and is otherwise without complaint today.   she has a BMI of Body mass index is 33.44 kg/m.[name] Filed Weights   09/16/23 0844  Weight: 77.7 kg     Current Outpatient Medications  Medication Sig Dispense Refill   acetaminophen  (TYLENOL ) 650 MG CR tablet Take 650 mg by mouth every 8 (eight) hours as needed for pain.     alendronate (FOSAMAX) 70 MG tablet Take 70 mg by mouth once a week.     ALPRAZolam (XANAX XR) 0.5 MG 24 hr tablet Take 0.5 mg by mouth daily as needed.     amoxicillin  (AMOXIL )  500 MG tablet Take 2 grams (4 tablets) by mouth once 30-60 minutes prior to dental procedure 4 tablet 0   apixaban  (ELIQUIS ) 5 MG TABS tablet Take 5 mg by mouth 2 (two) times a day.      Ascorbic Acid (VITAMIN C) 1000 MG tablet Take 1,000 mg by mouth daily.     atorvastatin  (LIPITOR) 40 MG tablet Take 40 mg by mouth every evening.      Calcium  Carbonate-Vit D-Min (CALCIUM  1200 PO) Take 1 tablet by mouth daily.     cephALEXin  (KEFLEX ) 250 MG capsule Take 1 capsule (250 mg total) by mouth 4 (four) times daily. 28 capsule 0   cholecalciferol (VITAMIN D3) 25 MCG (1000 UNIT) tablet Take 1,000 Units by mouth daily.     dapagliflozin propanediol (FARXIGA) 10 MG TABS tablet Take 10 mg by mouth daily.     diltiazem  (CARDIZEM  CD) 180 MG 24 hr capsule Take 1 capsule (180 mg total) by mouth daily. 90 capsule 2   furosemide  (LASIX ) 20 MG tablet Take 20 mg by mouth as needed.     metoprolol  tartrate (LOPRESSOR ) 100 MG tablet Take 1 tablet (100 mg total) by mouth 2 (two) times daily. 180 tablet 3   telmisartan  (MICARDIS ) 80 MG tablet Take 80 mg by mouth daily.     No current facility-administered medications for this encounter.    Atrial Fibrillation Management history:  Previous antiarrhythmic drugs: Multaq  Previous cardioversions: 07/09/22 Previous ablations: none Anticoagulation history: Eliquis    ROS- All systems are reviewed and negative except as per the HPI above.  Physical Exam: BP (!) 140/90   Pulse 65   Ht 5' (1.524 m)   Wt 77.7 kg   BMI 33.44 kg/m   GEN- The patient is well appearing, alert and oriented x 3 today.   Neck - no JVD or carotid bruit noted Lungs- Clear to ausculation bilaterally, normal work of breathing Heart- Irregular rate and rhythm, no murmurs, rubs or gallops, PMI not laterally displaced Extremities- no clubbing, cyanosis, or edema Skin - no rash or ecchymosis noted   EKG today demonstrates  Vent. rate 65 BPM PR interval 248 ms QRS duration 76 ms QT/QTcB  414/430 ms P-R-T axes 9 82 228 Sinus rhythm with 1st degree A-V block with frequent ventricular-paced complexes Marked ST abnormality, possible inferolateral subendocardial injury Abnormal ECG When compared with ECG of 09-Feb-2023 09:30, PR interval has increased  Echo 07/24/21 demonstrated   1. Left ventricular ejection fraction, by estimation, is 50 to 55%. The  left ventricle has low normal function. The left ventricle has no regional  wall motion abnormalities. Left ventricular diastolic parameters are  consistent with Grade II diastolic  dysfunction (pseudonormalization).   2. Right ventricular systolic function is normal. The right ventricular  size is normal. There is mildly elevated pulmonary artery systolic  pressure.   3. Left atrial size was severely dilated.   4. Mitral Valve Repair with 30 mm Physio ring annuloplasty and leaflet  repair, Cox-MAZE procedure with RF ablation and Cryo Left and Right sided  lesion sets, ligation of the atrial appendage, left atrial repair and  reconstruction with bovine  pericardium.. The mitral valve has been repaired/replaced. Mild mitral  valve regurgitation. The mean mitral valve gradient is 3.0 mmHg. Procedure  Date: 2017.   5. The aortic valve is normal in structure. Aortic valve regurgitation is  not visualized. No aortic stenosis is present.   6. The inferior vena cava is normal in size with greater than 50%  respiratory variability, suggesting right atrial pressure of 3 mmHg.   ASSESSMENT & PLAN CHA2DS2-VASc Score = 4  The patient's score is based upon: CHF History: 0 HTN History: 1 Diabetes History: 0 Stroke History: 0 Vascular Disease History: 1 Age Score: 1 Gender Score: 1       ASSESSMENT AND PLAN: Persistent Atrial Fibrillation (ICD10:  I48.19) The patient's CHA2DS2-VASc score is 4, indicating a 4.8% annual risk of stroke.    She is currently in Afib. We discussed rhythm control options which include AAD therapy and  ablation. She does not wish to pursue AAD at this time. After discussion and explanation of procedure, patient wishes to discuss ablation with Dr. Lawana Pray.  NSR 448 ms. CrCl 49 mL/min.  Secondary Hypercoagulable State (ICD10:  D68.69) The patient is at significant risk for stroke/thromboembolism based upon her CHA2DS2-VASc Score of 4.  Continue Apixaban  (Eliquis ).    Will help arrange follow up with Dr. Lawana Pray to discuss ablation.   Minnie Amber, PA-C  Afib Clinic [facility] [address] [place], [place] [address] [phone]

## 2023-09-22 DIAGNOSIS — Z6831 Body mass index (BMI) 31.0-31.9, adult: Secondary | ICD-10-CM | POA: Diagnosis not present

## 2023-09-22 DIAGNOSIS — E559 Vitamin D deficiency, unspecified: Secondary | ICD-10-CM | POA: Diagnosis not present

## 2023-09-22 DIAGNOSIS — N1832 Chronic kidney disease, stage 3b: Secondary | ICD-10-CM | POA: Diagnosis not present

## 2023-09-22 DIAGNOSIS — I4891 Unspecified atrial fibrillation: Secondary | ICD-10-CM | POA: Diagnosis not present

## 2023-09-22 DIAGNOSIS — I110 Hypertensive heart disease with heart failure: Secondary | ICD-10-CM | POA: Diagnosis not present

## 2023-10-02 NOTE — Progress Notes (Signed)
 Remote pacemaker transmission.

## 2023-10-12 ENCOUNTER — Ambulatory Visit: Attending: Cardiology | Admitting: Cardiology

## 2023-10-12 ENCOUNTER — Encounter: Payer: Self-pay | Admitting: Cardiology

## 2023-10-12 VITALS — BP 140/74 | HR 78 | Ht 60.0 in | Wt 169.0 lb

## 2023-10-12 DIAGNOSIS — I10 Essential (primary) hypertension: Secondary | ICD-10-CM

## 2023-10-12 DIAGNOSIS — I495 Sick sinus syndrome: Secondary | ICD-10-CM | POA: Diagnosis not present

## 2023-10-12 DIAGNOSIS — D6869 Other thrombophilia: Secondary | ICD-10-CM

## 2023-10-12 DIAGNOSIS — I4811 Longstanding persistent atrial fibrillation: Secondary | ICD-10-CM | POA: Diagnosis not present

## 2023-10-12 DIAGNOSIS — Z01812 Encounter for preprocedural laboratory examination: Secondary | ICD-10-CM

## 2023-10-12 MED ORDER — AMIODARONE HCL 200 MG PO TABS: ORAL_TABLET | ORAL | 2 refills | Status: DC

## 2023-10-12 NOTE — Patient Instructions (Signed)
 Medication Instructions:  Your physician has recommended you make the following change in your medication:  START Amiodarone  - take 2 tablets (400 mg total) TWICE a day for 2 weeks, then  - take 1 tablet (200 mg total) TWICE a day for 2 weeks, then  - take 1 tablet (200 mg total) ONCE a day  *If you need a refill on your cardiac medications before your next appointment, please call your pharmacy*  Lab Work: Pre procedure labs: BMET & CBC  If you have any lab test that is abnormal or we need to change your treatment, we will call you to review the results.  Testing/Procedures: Your physician has recommended that you have a Cardioversion (DCCV). Electrical Cardioversion uses a jolt of electricity to your heart either through paddles or wired patches attached to your chest. This is a controlled, usually prescheduled, procedure. Defibrillation is done under light anesthesia in the hospital, and you usually go home the day of the procedure. This is done to get your heart back into a normal rhythm. You are not awake for the procedure. Please see the instruction sheet given to you today.  Your physician has requested that you have cardiac CT 3 weeks prior to your ablation. Cardiac computed tomography (CT) is a painless test that uses an x-ray machine to take clear, detailed pictures of your heart. For further information please visit [email or web address]   We will contact you to schedule this.  Your physician has recommended that you have an ablation. Catheter ablation is a medical procedure used to treat some cardiac arrhythmias (irregular heartbeats). During catheter ablation, a long, thin, flexible tube is put into a blood vessel in your groin (upper thigh), or neck. This tube is called an ablation catheter. It is then guided to your heart through the blood vessel. Radio frequency waves destroy small areas of heart tissue where abnormal heartbeats may cause an arrhythmia to start.   We will contact  you to schedule this once October date are available.    Follow-Up: At [facility] you and your health needs are our priority.  As part of our continuing mission to provide you with exceptional heart care, our providers are all part of one team.  This team includes your primary Cardiologist (physician) and Advanced Practice Providers or APPs (Physician Assistants and Nurse Practitioners) who all work together to provide you with the care you need, when you need it.  Remote monitoring is used to monitor your Pacemaker or ICD from home. This monitoring reduces the number of office visits required to check your device to one time per year. It allows us  to keep an eye on the functioning of your device to ensure it is working properly. You are scheduled for a device check from home on 11/17/23. You may send your transmission at any time that day. If you have a wireless device, the transmission will be sent automatically. After your physician reviews your transmission, you will receive a postcard with your next transmission date.   Your next appointment:   after your ablation  Provider:   You will follow up in the Atrial Fibrillation Clinic located at [facility]. Your provider will be: Clint R. Fenton, PA-C or Fairy Heinrich, PA-C     Thank you for choosing [employer]!!   Maeola Domino, RN [phone]   Other Instructions   Cardiac Ablation Cardiac ablation is a procedure to destroy (ablate) heart tissue that is sending bad signals. These bad signals  cause the heart to beat very fast or in a way that is not normal. Destroying some tissues can help make the heart rhythm normal. Tell your doctor about: Any allergies you have. All medicines you are taking. These include vitamins, herbs, eye drops, creams, and over-the-counter medicines. Any problems you or family members have had with anesthesia. Any bleeding problems you have. Any surgeries you have had. Any medical  conditions you have. Whether you are pregnant or may be pregnant. What are the risks? Your doctor will talk with you about risks. These may include: Infection. Bruising and bleeding. Stroke or blood clots. Damage to nearby areas of your body. Allergies to medicines or dyes. Needing a pacemaker if the heart gets damaged. A pacemaker helps the heart beat normally. The procedure not working. What happens before the procedure? Medicines Ask your doctor about changing or stopping: Your normal medicines. Vitamins, herbs, and supplements. Over-the-counter medicines. Do not take aspirin or ibuprofen unless you are told to. General instructions Follow instructions from your doctor about what you may eat and drink. If you will be going home right after the procedure, plan to have a responsible adult: Take you home from the hospital or clinic. You will not be allowed to drive. Care for you for the time you are told. Ask your doctor what steps will be taken to prevent the spread of germs. What happens during the procedure?  An IV tube will be put into one of your veins. You may be given: A sedative. This helps you relax. Anesthesia. This will: Numb certain areas of your body. The skin on your neck or groin will be numbed. A cut (incision) will be made in your neck or groin. A needle will be put through the cut and into a large vein. The small, thin tube (catheter) will be put into the needle. The tube will be moved to your heart. A type of X-ray (fluoroscopy) will be used to help guide the tube. It will also show constant images of the heart on a screen. Dye may be put through the tube. This helps your doctor see your heart. An electric current will be sent from the tube to destroy heart tissue in certain areas. The tube will be taken out. Pressure will be held on your cut. This helps stop bleeding. A bandage (dressing) will be put over your cut. The procedure may vary among doctors and  hospitals. What happens after the procedure? You will be monitored until you leave the hospital or clinic. This includes checking your blood pressure, heart rate and rhythm, breathing rate, and blood oxygen level. Your cut will be checked for bleeding. You will need to lie still for a few hours. If your groin was used, you will need to keep your leg straight for a few hours after the small, thin tube is removed. This information is not intended to replace advice given to you by your health care provider. Make sure you discuss any questions you have with your health care provider. Document Revised: 09/24/2021 Document Reviewed: 09/24/2021 Elsevier Patient Education  2024 Elsevier Inc.  Cardioversion    Dear Isabel Hardy  You are scheduled for a Cardioversion on Tuesday, July 15 with Dr. Delford.  Please arrive at the [facility] (Main Entrance A) at [facility]: [address] [place], [place] [address] at 7:00 AM (This time is 1 hour(s) before your procedure to ensure your preparation).   Free valet parking service is available. You will  check in at ADMITTING.   *Please Note: You will receive a call the day before your procedure to confirm the appointment time. That time may have changed from the original time based on the schedule for that day.*   DIET:  Nothing to eat or drink after midnight except a sip of water with medications (see medication instructions below)  MEDICATION INSTRUCTIONS: !!IF ANY NEW MEDICATIONS ARE STARTED AFTER TODAY, PLEASE NOTIFY YOUR PROVIDER AS SOON AS POSSIBLE!!  FYI: Medications such as Semaglutide (Ozempic, [place]), Tirzepatide (Mounjaro, Zepbound), Dulaglutide (Trulicity), etc (GLP1 agonists) AND Canagliflozin (Invokana), Dapagliflozin (Farxiga), Empagliflozin (Jardiance), Ertugliflozin (Steglatro), Bexagliflozin [employer]) or any combination with one of these drugs such as Invokamet (Canagliflozin/Metformin), Synjardy (Empagliflozin/Metformin),  etc (SGLT2 inhibitors) must be held around the time of a procedure. This is not a comprehensive list of all of these drugs. Please review all of your medications and talk to your provider if you take any one of these. If you are not sure, ask your provider.         :1}HOLD: Dapagliflozin Pauletta) for 3 days prior to the procedure. Last dose on Friday, July 11.   HOLD Lasix  day of procedure       :1}Continue taking your anticoagulant (blood thinner): Apixaban  (Eliquis ).  You will need to continue this after your procedure until you are told by your provider that it is safe to stop.    LABS: Today  FYI:  For your safety, and to allow us  to monitor your vital signs accurately during the surgery/procedure we request: If you have artificial nails, gel coating, SNS etc, please have those removed prior to your surgery/procedure. Not having the nail coverings /polish removed may result in cancellation or delay of your surgery/procedure.  Your support person will be asked to wait in the waiting room during your procedure.  It is OK to have someone drop you off and come back when you are ready to be discharged.  You cannot drive after the procedure and will need someone to drive you home.  Bring your insurance cards.  *Special Note: Every effort is made to have your procedure done on time. Occasionally there are emergencies that occur at the hospital that may cause delays. Please be patient if a delay does occur.

## 2023-10-12 NOTE — H&P (View-Only) (Signed)
 Electrophysiology Office Note:   Date:  10/12/2023  ID:  Isabel Hardy, DOB 10/25/1950, MRN [id number]  Primary Cardiologist: None Primary Heart Failure: None Electrophysiologist: Iain Sawchuk Gladis Norton, MD      History of Present Illness:   Isabel Hardy is a 73 y.o. 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   Since last being seen in our clinic the patient reports increasing fatigue, weakness, shortness of breath.  She is able to do her daily activities, but has to do them more slowly and has to take more breaks.  She has been feeling this way for many months.  She had been atrial fibrillation for at least a year.  She has had a surgical maze in 2017 with left atrial appendage clip.  She would like to avoid long-term antiarrhythmics.  When she is at rest, she has no acute complaints.  She does notice symptoms with minimal activity such as walking around the grocery store.  she denies chest pain, palpitations, PND, orthopnea, nausea, vomiting, dizziness, syncope, edema, weight gain, or early satiety.   Review of systems complete and found to be negative unless listed in HPI.      EP Information / Studies Reviewed:    EKG is not ordered today. EKG from 09/16/2023 reviewed which showed atrial fibrillation, intermittent ventricular pacing with fused complexes      PPM Interrogation-  reviewed in detail today,  See PACEART report.  Device History: Medtronic Dual Chamber PPM implanted 2019 for Tachy-Brady syndrome  Risk Assessment/Calculations:    CHA2DS2-VASc Score = 4   This indicates a 4.8% annual risk of stroke. The patient's score is based upon: CHF History: 0 HTN History: 1 Diabetes History: 0 Stroke History: 0 Vascular Disease History: 1 Age Score: 1 Gender Score: 1            Physical  Exam:   VS:  BP (!) 140/74 (BP Location: Right Arm, Patient Position: Sitting, Cuff Size: Normal)   Pulse 78   Ht 5' (1.524 m)   Wt 169 lb (76.7 kg)   SpO2 97%   BMI 33.01 kg/m    Wt Readings from Last 3 Encounters:  10/12/23 169 lb (76.7 kg)  09/16/23 171 lb 3.2 oz (77.7 kg)  05/22/23 170 lb (77.1 kg)     GEN: Well nourished, well developed in no acute distress NECK: No JVD; No carotid bruits CARDIAC: Irregularly irregular rate and rhythm, no murmurs, rubs, gallops RESPIRATORY:  Clear to auscultation without rales, wheezing or rhonchi  ABDOMEN: Soft, non-tender, non-distended EXTREMITIES:  No edema; No deformity   ASSESSMENT AND PLAN:    Tachy-Brady syndrome s/p Medtronic PPM  Normal PPM function See Pace Art report No changes today  2.  Longstanding persistent atrial fibrillation: Postsurgical maze and left atrial appendage clip.  She would prefer a rhythm control option to treat her atrial fibrillation.  She would like to avoid long-term antiarrhythmics.  She feels quite poor with significant fatigue and shortness of breath.  Due to that we [name] plan for ablation.  In the interim, we [name] load with amiodarone and plan for cardioversion.  She does have a left atrial appendage clip.  [name] start her on Eliquis  as well.  Risk, benefits, and alternatives to EP study and radiofrequency/pulse field ablation for afib were also discussed in detail today. These risks include but are not limited to stroke,  bleeding, vascular damage, tamponade, perforation, damage to the esophagus, lungs, and other structures, pulmonary vein stenosis, worsening renal function, and death. The patient understands these risk and wishes to proceed.  We [name] therefore proceed with catheter ablation at the next available time.  Carto, ICE, anesthesia are requested for the procedure.  [name] also obtain CT PV protocol prior to the procedure to exclude LAA thrombus and further evaluate atrial anatomy.  3.  Hypertension:  Mildly elevated but usually well-controlled  4.  Mitral valve repair: Plan per primary cardiology  5.  Secondary hypercoagulable state: On Eliquis  for atrial fibrillation  Disposition:   Follow up with Afib Clinic as usual post procedure  Signed, [name], MD

## 2023-10-12 NOTE — Progress Notes (Signed)
 Electrophysiology Office Note:   Date:  10/12/2023  ID:  Isabel Hardy, DOB 10/25/1950, MRN [id number]  Primary Cardiologist: None Primary Heart Failure: None Electrophysiologist: Iain Sawchuk Gladis Norton, MD      History of Present Illness:   Isabel Hardy is a 73 y.o. 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   Since last being seen in our clinic the patient reports increasing fatigue, weakness, shortness of breath.  She is able to do her daily activities, but has to do them more slowly and has to take more breaks.  She has been feeling this way for many months.  She had been atrial fibrillation for at least a year.  She has had a surgical maze in 2017 with left atrial appendage clip.  She would like to avoid long-term antiarrhythmics.  When she is at rest, she has no acute complaints.  She does notice symptoms with minimal activity such as walking around the grocery store.  she denies chest pain, palpitations, PND, orthopnea, nausea, vomiting, dizziness, syncope, edema, weight gain, or early satiety.   Review of systems complete and found to be negative unless listed in HPI.      EP Information / Studies Reviewed:    EKG is not ordered today. EKG from 09/16/2023 reviewed which showed atrial fibrillation, intermittent ventricular pacing with fused complexes      PPM Interrogation-  reviewed in detail today,  See PACEART report.  Device History: Medtronic Dual Chamber PPM implanted 2019 for Tachy-Brady syndrome  Risk Assessment/Calculations:    CHA2DS2-VASc Score = 4   This indicates a 4.8% annual risk of stroke. The patient's score is based upon: CHF History: 0 HTN History: 1 Diabetes History: 0 Stroke History: 0 Vascular Disease History: 1 Age Score: 1 Gender Score: 1            Physical  Exam:   VS:  BP (!) 140/74 (BP Location: Right Arm, Patient Position: Sitting, Cuff Size: Normal)   Pulse 78   Ht 5' (1.524 m)   Wt 169 lb (76.7 kg)   SpO2 97%   BMI 33.01 kg/m    Wt Readings from Last 3 Encounters:  10/12/23 169 lb (76.7 kg)  09/16/23 171 lb 3.2 oz (77.7 kg)  05/22/23 170 lb (77.1 kg)     GEN: Well nourished, well developed in no acute distress NECK: No JVD; No carotid bruits CARDIAC: Irregularly irregular rate and rhythm, no murmurs, rubs, gallops RESPIRATORY:  Clear to auscultation without rales, wheezing or rhonchi  ABDOMEN: Soft, non-tender, non-distended EXTREMITIES:  No edema; No deformity   ASSESSMENT AND PLAN:    Tachy-Brady syndrome s/p Medtronic PPM  Normal PPM function See Pace Art report No changes today  2.  Longstanding persistent atrial fibrillation: Postsurgical maze and left atrial appendage clip.  She would prefer a rhythm control option to treat her atrial fibrillation.  She would like to avoid long-term antiarrhythmics.  She feels quite poor with significant fatigue and shortness of breath.  Due to that we [name] plan for ablation.  In the interim, we [name] load with amiodarone and plan for cardioversion.  She does have a left atrial appendage clip.  [name] start her on Eliquis  as well.  Risk, benefits, and alternatives to EP study and radiofrequency/pulse field ablation for afib were also discussed in detail today. These risks include but are not limited to stroke,  bleeding, vascular damage, tamponade, perforation, damage to the esophagus, lungs, and other structures, pulmonary vein stenosis, worsening renal function, and death. The patient understands these risk and wishes to proceed.  We [name] therefore proceed with catheter ablation at the next available time.  Carto, ICE, anesthesia are requested for the procedure.  [name] also obtain CT PV protocol prior to the procedure to exclude LAA thrombus and further evaluate atrial anatomy.  3.  Hypertension:  Mildly elevated but usually well-controlled  4.  Mitral valve repair: Plan per primary cardiology  5.  Secondary hypercoagulable state: On Eliquis  for atrial fibrillation  Disposition:   Follow up with Afib Clinic as usual post procedure  Signed, [name], MD

## 2023-10-13 LAB — CUP PACEART INCLINIC DEVICE CHECK
Date Time Interrogation Session: 20250623101500
Implantable Lead Connection Status: 753985
Implantable Lead Connection Status: 753985
Implantable Lead Implant Date: 20191127
Implantable Lead Implant Date: 20191127
Implantable Lead Location: 753859
Implantable Lead Location: 753860
Implantable Lead Model: 5076
Implantable Lead Model: 5076
Implantable Pulse Generator Implant Date: 20191127

## 2023-10-14 ENCOUNTER — Ambulatory Visit: Payer: Self-pay | Admitting: Cardiology

## 2023-10-26 DIAGNOSIS — I4811 Longstanding persistent atrial fibrillation: Secondary | ICD-10-CM | POA: Diagnosis not present

## 2023-10-26 DIAGNOSIS — Z01810 Encounter for preprocedural cardiovascular examination: Secondary | ICD-10-CM | POA: Diagnosis not present

## 2023-10-26 DIAGNOSIS — Z01812 Encounter for preprocedural laboratory examination: Secondary | ICD-10-CM | POA: Diagnosis not present

## 2023-10-27 LAB — CBC
Hematocrit: 39.2 % (ref 34.0–46.6)
Hemoglobin: 13.3 g/dL (ref 11.1–15.9)
MCH: 31.7 pg (ref 26.6–33.0)
MCHC: 33.9 g/dL (ref 31.5–35.7)
MCV: 94 fL (ref 79–97)
Platelets: 190 x10E3/uL (ref 150–450)
RBC: 4.19 x10E6/uL (ref 3.77–5.28)
RDW: 14.7 % (ref 11.7–15.4)
WBC: 5.3 x10E3/uL (ref 3.4–10.8)

## 2023-10-27 LAB — BASIC METABOLIC PANEL WITH GFR
BUN/Creatinine Ratio: 12 (ref 12–28)
BUN: 21 mg/dL (ref 8–27)
CO2: 22 mmol/L (ref 20–29)
Calcium: 9.1 mg/dL (ref 8.7–10.3)
Chloride: 109 mmol/L — ABNORMAL HIGH (ref 96–106)
Creatinine, Ser: 1.75 mg/dL — ABNORMAL HIGH (ref 0.57–1.00)
Glucose: 84 mg/dL (ref 70–99)
Potassium: 3.8 mmol/L (ref 3.5–5.2)
Sodium: 145 mmol/L — ABNORMAL HIGH (ref 134–144)
eGFR: 31 mL/min/1.73 — ABNORMAL LOW (ref >59)

## 2023-10-30 ENCOUNTER — Other Ambulatory Visit: Payer: Self-pay

## 2023-10-30 MED ORDER — METOPROLOL TARTRATE 50 MG PO TABS: 50.0000 mg | ORAL_TABLET | Freq: Two times a day (BID) | ORAL | 3 refills | Status: DC

## 2023-11-02 NOTE — Progress Notes (Signed)
 Pt called for pre procedure instructions.  No answer on either phone.  No ability to leave message. Arrival time 0645 NPO after midnight explained Instructed to take am meds with sip of water and confirmed blood thinner consistency Instructed pt need for ride home tomorrow and have responsible adult with them for 24 hrs post procedure.

## 2023-11-03 ENCOUNTER — Encounter (HOSPITAL_COMMUNITY): Payer: Self-pay | Admitting: Cardiovascular Disease

## 2023-11-03 ENCOUNTER — Ambulatory Visit (HOSPITAL_COMMUNITY): Admitting: Anesthesiology

## 2023-11-03 ENCOUNTER — Encounter (HOSPITAL_COMMUNITY)
Admission: RE | Disposition: A | Discharge status: Home / Self Care | Payer: Self-pay | Source: Home / Self Care | Attending: Cardiovascular Disease

## 2023-11-03 ENCOUNTER — Other Ambulatory Visit: Payer: Self-pay

## 2023-11-03 ENCOUNTER — Ambulatory Visit (HOSPITAL_COMMUNITY)
Admission: RE | Admit: 2023-11-03 | Discharge: 2023-11-03 | Disposition: A | Discharge status: Home / Self Care | Attending: Cardiovascular Disease | Admitting: Cardiovascular Disease

## 2023-11-03 DIAGNOSIS — Z7901 Long term (current) use of anticoagulants: Secondary | ICD-10-CM | POA: Diagnosis not present

## 2023-11-03 DIAGNOSIS — I4891 Unspecified atrial fibrillation: Secondary | ICD-10-CM

## 2023-11-03 DIAGNOSIS — Z79899 Other long term (current) drug therapy: Secondary | ICD-10-CM | POA: Diagnosis not present

## 2023-11-03 DIAGNOSIS — E785 Hyperlipidemia, unspecified: Secondary | ICD-10-CM | POA: Insufficient documentation

## 2023-11-03 DIAGNOSIS — I495 Sick sinus syndrome: Secondary | ICD-10-CM | POA: Insufficient documentation

## 2023-11-03 DIAGNOSIS — I10 Essential (primary) hypertension: Secondary | ICD-10-CM | POA: Diagnosis not present

## 2023-11-03 DIAGNOSIS — Z950 Presence of cardiac pacemaker: Secondary | ICD-10-CM | POA: Insufficient documentation

## 2023-11-03 DIAGNOSIS — I4811 Longstanding persistent atrial fibrillation: Secondary | ICD-10-CM | POA: Diagnosis not present

## 2023-11-03 DIAGNOSIS — I4819 Other persistent atrial fibrillation: Secondary | ICD-10-CM

## 2023-11-03 DIAGNOSIS — D6869 Other thrombophilia: Secondary | ICD-10-CM | POA: Diagnosis not present

## 2023-11-03 HISTORY — PX: CARDIOVERSION: EP1203

## 2023-11-03 SURGERY — CARDIOVERSION (CATH LAB)
Anesthesia: General

## 2023-11-03 MED ORDER — SODIUM CHLORIDE 0.9 % IV SOLN: INTRAVENOUS | Status: DC

## 2023-11-03 MED ORDER — PROPOFOL 10 MG/ML IV BOLUS
INTRAVENOUS | Status: DC | PRN
  Administered 2023-11-03: 60 mg via INTRAVENOUS

## 2023-11-03 MED ORDER — LIDOCAINE 2% (20 MG/ML) 5 ML SYRINGE
INTRAMUSCULAR | Status: DC | PRN
  Administered 2023-11-03: 50 mg via INTRAVENOUS

## 2023-11-03 MED ORDER — HYDRALAZINE HCL 50 MG PO TABS
50.0000 mg | ORAL_TABLET | Freq: Once | ORAL | Status: AC
  Administered 2023-11-03: 50 mg via ORAL
  Filled 2023-11-03: qty 1

## 2023-11-03 SURGICAL SUPPLY — 1 items: PAD DEFIB RADIO PHYSIO CONN (PAD) ×1 IMPLANT

## 2023-11-03 NOTE — Anesthesia Postprocedure Evaluation (Signed)
 Anesthesia Post Note  Patient: Isabel Hardy  Procedure(s) Performed: CARDIOVERSION     Patient location during evaluation: Cath Lab Anesthesia Type: General Level of consciousness: awake and alert Pain management: pain level controlled Vital Signs Assessment: post-procedure vital signs reviewed and stable Respiratory status: spontaneous breathing, nonlabored ventilation and respiratory function stable Cardiovascular status: blood pressure returned to baseline and stable Postop Assessment: no apparent nausea or vomiting Anesthetic complications: no   No notable events documented.  Last Vitals:  Vitals:   11/03/23 1000 11/03/23 1015  BP: (!) 201/105 (!) 170/102  Pulse: 61 66  Resp:    Temp:    SpO2: 97% 99%    Last Pain:  Vitals:   11/03/23 1015  TempSrc:   PainSc: 0-No pain                 Nikita Surman,W. EDMOND

## 2023-11-03 NOTE — Anesthesia Preprocedure Evaluation (Addendum)
 Anesthesia Evaluation  Patient identified by MRN, date of birth, ID band  Reviewed: Allergy & Precautions, H&P , NPO status , Patient's Chart, lab work & pertinent test results  Airway Mallampati: III  TM Distance: >3 FB Neck ROM: Full    Dental no notable dental hx. (+) Teeth Intact, Dental Advisory Given   Pulmonary neg pulmonary ROS   Pulmonary exam normal breath sounds clear to auscultation       Cardiovascular hypertension, Pt. on medications and Pt. on home beta blockers + dysrhythmias Atrial Fibrillation + pacemaker  Rhythm:Irregular Rate:Normal  S/p MVR   Neuro/Psych negative neurological ROS  negative psych ROS   GI/Hepatic negative GI ROS, Neg liver ROS,,,  Endo/Other  negative endocrine ROS    Renal/GU Renal disease  negative genitourinary   Musculoskeletal   Abdominal   Peds  Hematology negative hematology ROS (+)   Anesthesia Other Findings   Reproductive/Obstetrics negative OB ROS                              Anesthesia Physical Anesthesia Plan  ASA: 3  Anesthesia Plan: General   Post-op Pain Management: Minimal or no pain anticipated   Induction: Intravenous  PONV Risk Score and Plan: 3 and Propofol  infusion and Treatment may vary due to age or medical condition  Airway Management Planned: Natural Airway and Nasal Cannula  Additional Equipment:   Intra-op Plan:   Post-operative Plan:   Informed Consent: I have reviewed the patients History and Physical, chart, labs and discussed the procedure including the risks, benefits and alternatives for the proposed anesthesia with the patient or authorized representative who has indicated his/her understanding and acceptance.     Dental advisory given  Plan Discussed with: CRNA  Anesthesia Plan Comments:          Anesthesia Quick Evaluation

## 2023-11-03 NOTE — CV Procedure (Signed)
 [facility]: Anesthesia: Propofol  Dr Epifanio On Rx Eliquis  with no missed doses  DCC x 2 250 then 300 J biphasic Converted from afib to NSR and AV pacing  Atrial electrograms from PPM confirmed restoration of NSR  No immediate neurologic sequelae  [name] MD [facility]

## 2023-11-03 NOTE — Interval H&P Note (Signed)
 History and Physical Interval Note:  11/03/2023 7:22 AM  Isabel Hardy  has presented today for surgery, with the diagnosis of AFIB.  The various methods of treatment have been discussed with the patient and family. After consideration of risks, benefits and other options for treatment, the patient has consented to  Procedure(s): CARDIOVERSION (N/A) as a surgical intervention.  The patient's history has been reviewed, patient examined, no change in status, stable for surgery.  I have reviewed the patient's chart and labs.  Questions were answered to the patient's satisfaction.     [name]

## 2023-11-03 NOTE — Transfer of Care (Signed)
 Immediate Anesthesia Transfer of Care Note  Patient: Isabel Hardy  Procedure(s) Performed: CARDIOVERSION  Patient Location: Cath Lab  Anesthesia Type:General  Level of Consciousness: drowsy and patient cooperative  Airway & Oxygen Therapy: Patient Spontanous Breathing and Patient connected to nasal cannula oxygen  Post-op Assessment: Report given to RN and Post -op Vital signs reviewed and stable  Post vital signs: Reviewed and stable  Last Vitals:  Vitals Value Taken Time  BP 169/104 11/03/23 08:18  Temp    Pulse 80 11/03/23 08:18  Resp 15 11/03/23 08:18  SpO2 97 % 11/03/23 08:18  Vitals shown include unfiled device data.  Last Pain:  Vitals:   11/03/23 0811  TempSrc:   PainSc: 0-No pain         Complications: No notable events documented.

## 2023-11-04 DIAGNOSIS — S0093XA Contusion of unspecified part of head, initial encounter: Secondary | ICD-10-CM | POA: Diagnosis not present

## 2023-11-04 DIAGNOSIS — M25531 Pain in right wrist: Secondary | ICD-10-CM | POA: Diagnosis not present

## 2023-11-12 DIAGNOSIS — Z6831 Body mass index (BMI) 31.0-31.9, adult: Secondary | ICD-10-CM | POA: Diagnosis not present

## 2023-11-12 DIAGNOSIS — M25531 Pain in right wrist: Secondary | ICD-10-CM | POA: Diagnosis not present

## 2023-11-17 ENCOUNTER — Ambulatory Visit: Payer: Medicare PPO

## 2023-11-17 DIAGNOSIS — I495 Sick sinus syndrome: Secondary | ICD-10-CM

## 2023-11-18 LAB — CUP PACEART REMOTE DEVICE CHECK
Battery Remaining Longevity: 72 mo
Battery Voltage: 2.97 V
Brady Statistic AP VP Percent: 0.5 %
Brady Statistic AP VS Percent: 97.66 %
Brady Statistic AS VP Percent: 0.01 %
Brady Statistic AS VS Percent: 1.83 %
Brady Statistic RA Percent Paced: 97.61 %
Brady Statistic RV Percent Paced: 0.58 %
Date Time Interrogation Session: 20250728220509
Implantable Lead Connection Status: 753985
Implantable Lead Connection Status: 753985
Implantable Lead Implant Date: 20191127
Implantable Lead Implant Date: 20191127
Implantable Lead Location: 753859
Implantable Lead Location: 753860
Implantable Lead Model: 5076
Implantable Lead Model: 5076
Implantable Pulse Generator Implant Date: 20191127
Lead Channel Impedance Value: 304 Ohm
Lead Channel Impedance Value: 323 Ohm
Lead Channel Impedance Value: 342 Ohm
Lead Channel Impedance Value: 380 Ohm
Lead Channel Pacing Threshold Amplitude: 0.5 V
Lead Channel Pacing Threshold Amplitude: 0.625 V
Lead Channel Pacing Threshold Pulse Width: 0.4 ms
Lead Channel Pacing Threshold Pulse Width: 0.4 ms
Lead Channel Sensing Intrinsic Amplitude: 1.125 mV
Lead Channel Sensing Intrinsic Amplitude: 1.125 mV
Lead Channel Sensing Intrinsic Amplitude: 9.625 mV
Lead Channel Sensing Intrinsic Amplitude: 9.625 mV
Lead Channel Setting Pacing Amplitude: 1.5 V
Lead Channel Setting Pacing Amplitude: 1.5 V
Lead Channel Setting Pacing Pulse Width: 0.4 ms
Lead Channel Setting Sensing Sensitivity: 0.9 mV
Zone Setting Status: 755011
Zone Setting Status: 755011

## 2023-11-21 ENCOUNTER — Ambulatory Visit: Payer: Self-pay | Admitting: Cardiology

## 2023-11-23 ENCOUNTER — Telehealth: Payer: Self-pay

## 2023-11-23 NOTE — Telephone Encounter (Signed)
 Alert received from [employer] for AT/AF Daily Burden > Threshold. AF/AFL in progress from 8/1 @ 21:08, hx of persistent AF, Eliquis  per EPIC.  Successful DCCV 7/15.  Patient was not seen in AF clinic post DCCV. Patient is currently asymptomatic. Advised per Dr. Inocencio he wanted pt to follow up with AF clinic but apt was not scheduled. Patient is agreeable to AF clinic referral. Advised pt someone will contact her for apt. Pt was appreciative of call.

## 2023-11-25 ENCOUNTER — Telehealth: Payer: Self-pay

## 2023-11-25 DIAGNOSIS — I4891 Unspecified atrial fibrillation: Secondary | ICD-10-CM

## 2023-11-25 DIAGNOSIS — I4819 Other persistent atrial fibrillation: Secondary | ICD-10-CM

## 2023-11-25 DIAGNOSIS — I495 Sick sinus syndrome: Secondary | ICD-10-CM

## 2023-11-25 DIAGNOSIS — Z6832 Body mass index (BMI) 32.0-32.9, adult: Secondary | ICD-10-CM | POA: Diagnosis not present

## 2023-11-25 DIAGNOSIS — T148XXA Other injury of unspecified body region, initial encounter: Secondary | ICD-10-CM | POA: Diagnosis not present

## 2023-11-25 NOTE — Telephone Encounter (Signed)
 Left message for patient to call back to schedule ablation with Dr. Inocencio. Patient does not need a CT per Dr. Inocencio.

## 2023-11-26 NOTE — Telephone Encounter (Signed)
 Pt returning call to schedule appt

## 2023-12-02 NOTE — Telephone Encounter (Signed)
 Spoke with the patient and scheduled her for an ablation with Dr. Inocencio on 10/15.

## 2023-12-04 ENCOUNTER — Ambulatory Visit (HOSPITAL_COMMUNITY)
Admission: RE | Admit: 2023-12-04 | Discharge: 2023-12-04 | Disposition: A | Discharge status: Home / Self Care | Source: Ambulatory Visit | Attending: Physician Assistant | Admitting: Physician Assistant

## 2023-12-04 VITALS — BP 158/90 | HR 66 | Ht 60.0 in | Wt 177.6 lb

## 2023-12-04 DIAGNOSIS — I4819 Other persistent atrial fibrillation: Secondary | ICD-10-CM | POA: Diagnosis not present

## 2023-12-04 DIAGNOSIS — I4811 Longstanding persistent atrial fibrillation: Secondary | ICD-10-CM | POA: Diagnosis not present

## 2023-12-04 DIAGNOSIS — I4891 Unspecified atrial fibrillation: Secondary | ICD-10-CM | POA: Diagnosis not present

## 2023-12-04 DIAGNOSIS — D6869 Other thrombophilia: Secondary | ICD-10-CM | POA: Diagnosis not present

## 2023-12-04 DIAGNOSIS — Z5181 Encounter for therapeutic drug level monitoring: Secondary | ICD-10-CM

## 2023-12-04 DIAGNOSIS — Z79899 Other long term (current) drug therapy: Secondary | ICD-10-CM

## 2023-12-04 DIAGNOSIS — R600 Localized edema: Secondary | ICD-10-CM

## 2023-12-04 NOTE — Patient Instructions (Addendum)
 Cardioversion scheduled for: Tuesday, August 19th   - Arrive at the [facility] ([address]  and check in with ADMITTING at 10:00 AM   - Do not eat or drink anything after midnight the night prior to your procedure.   - Take all your morning medication (except diabetic medications) with a sip of water prior to arrival.  - Do NOT miss any doses of your blood thinner - if you should miss a dose or take a dose more than 4 hours late -- please notify our office immediately.  - You will not be able to drive home after your procedure. Please ensure you have a responsible adult to drive you home. You will need someone with you for 24 hours post procedure.     - Expect to be in the procedural area approximately 2 hours.   - If you feel as if you go back into normal rhythm prior to scheduled cardioversion, please notify our office immediately.   If your procedure is canceled in the cardioversion suite you will be charged a cancellation fee.     Hold below medications 72 hours prior to scheduled procedure/anesthesia. Restart medication on the following day after scheduled procedure/anesthesia  Dapagliflozin Pauletta) -- HOLD as of Today until after procedure  **Patients on the above medications scheduled for elective procedures that have not held the medication for the appropriate amount of time are at risk of cancellation or change in the anesthetic plan.

## 2023-12-04 NOTE — Progress Notes (Signed)
 Primary Care Physician: Clemmie Nest, MD Primary Cardiologist: None Electrophysiologist: Will Gladis Norton, MD  Referring Physician: Dr. Norton [name] Isabel is a 73 y.o. Hardy with a history of HTN, HLD, aortic atherosclerosis by CT imaging, mitral valve repair with surgical maze and left atrial appendage clipping, tachybrady syndrome s/p Medtronic dual-chamber pacemaker in 2019, atrial flutter, and persistent atrial fibrillation who presents for follow up in the [facility]. She was previously on Multaq . Device interrogation by Dr. Norton showed Afib with poor ventricular response and patient started on diltiazem  180 mg daily. Patient is on Eliquis  for stroke prevention.   On evaluation today, she is currently in Afib. She notes overall hesitation to proceed with AAD therapy. She has been compliant with Eliquis . She notes to really not feel bad when in Afib and also wonders what would happen if she did nothing to treat her Afib.   On follow up 09/16/23, she is currently in Afib. Recent device interrogation showed persistent Afib with controlled rates. She has not missed any doses of Eliquis .  Follow up 8/15. Patient returns for follow up for atrial fibrillation and amiodarone  monitoring. She went back into afib on 11/20/23 after a DCCV on 7/15. There were no specific triggers that she could identify. She did feel more energetic and less SOB when in SR. She is scheduled for an ablation with Dr Norton in October. She is also having increased lower extremity edema.   Today, she  denies symptoms of palpitations, chest pain, orthopnea, PND, dizziness, presyncope, syncope, snoring, daytime somnolence, bleeding, or neurologic sequela. The patient is tolerating medications without difficulties and is otherwise without complaint today.    she has a BMI of Body mass index is 34.69 kg/m.SABRA Filed Weights   12/04/23 0925  Weight: 80.6 kg     Current  Outpatient Medications  Medication Sig Dispense Refill   acetaminophen  (TYLENOL ) 650 MG CR tablet Take 650 mg by mouth every 8 (eight) hours as needed for pain. (Patient taking differently: Take 650 mg by mouth as needed for pain.)     ALPRAZolam (XANAX XR) 0.5 MG 24 hr tablet Take 0.5 mg by mouth daily as needed for anxiety.     amiodarone  (PACERONE ) 200 MG tablet Take 2 tablets (400 mg total) TWICE a day for 2 weeks, then take 1 tablet (200 mg total) TWICE a day for 2 weeks, then take 1 tablet (200 mg total) ONCE a day thereafter (Patient taking differently: Taking 1 tablet by mouth daily) 90 tablet 2   amoxicillin  (AMOXIL ) 500 MG tablet Take 2 grams (4 tablets) by mouth once 30-60 minutes prior to dental procedure 4 tablet 0   apixaban  (ELIQUIS ) 5 MG TABS tablet Take 5 mg by mouth 2 (two) times a day.      Ascorbic Acid (VITAMIN C) 1000 MG tablet Take 1,000 mg by mouth daily.     atorvastatin  (LIPITOR) 40 MG tablet Take 40 mg by mouth every evening.      Calcium  Carbonate-Vit D-Min (CALCIUM  1200 PO) Take 1 tablet by mouth daily.     cholecalciferol (VITAMIN D3) 25 MCG (1000 UNIT) tablet Take 1,000 Units by mouth daily.     dapagliflozin propanediol (FARXIGA) 10 MG TABS tablet Take 10 mg by mouth daily.     diltiazem  (CARDIZEM  CD) 180 MG 24 hr capsule Take 1 capsule (180 mg total) by mouth daily. 90 capsule 2   furosemide  (LASIX ) 20 MG tablet Take  20 mg by mouth daily as needed for fluid or edema.     metoprolol  tartrate (LOPRESSOR ) 50 MG tablet Take 1 tablet (50 mg total) by mouth 2 (two) times daily. 180 tablet 3   mupirocin ointment (BACTROBAN) 2 % Apply topically 2 (two) times daily.     telmisartan  (MICARDIS ) 80 MG tablet Take 80 mg by mouth daily.     traMADol  (ULTRAM ) 50 MG tablet Take 50 mg by mouth every 8 (eight) hours as needed. (Patient taking differently: Take 50 mg by mouth as needed.)     No current facility-administered medications for this encounter.    Atrial Fibrillation  Management history:  Previous antiarrhythmic drugs: Multaq , amiodarone  Previous cardioversions: 07/09/22, 11/03/23 Previous ablations: none Anticoagulation history: Eliquis    ROS- All systems are reviewed and negative except as per the HPI above.  Physical Exam: BP (!) 176/100   Pulse 66   Ht 5' (1.524 m)   Wt 80.6 kg   BMI 34.69 kg/m   GEN: Well nourished, well developed in no acute distress NECK: No JVD CARDIAC: Irregularly irregular rate and rhythm, no murmurs, rubs, gallops RESPIRATORY:  Clear to auscultation without rales, wheezing or rhonchi  ABDOMEN: Soft, non-tender, non-distended EXTREMITIES:  1-2+ bilateral lower extremity edema, No deformity    EKG today demonstrates  Afib with occasional V pacing Vent. rate 66 BPM PR interval * ms QRS duration 88 ms QT/QTcB 446/467 ms   Echo 07/24/21 demonstrated   1. Left ventricular ejection fraction, by estimation, is 50 to 55%. The  left ventricle has low normal function. The left ventricle has no regional  wall motion abnormalities. Left ventricular diastolic parameters are  consistent with Grade II diastolic  dysfunction (pseudonormalization).   2. Right ventricular systolic function is normal. The right ventricular  size is normal. There is mildly elevated pulmonary artery systolic  pressure.   3. Left atrial size was severely dilated.   4. Mitral Valve Repair with 30 mm Physio ring annuloplasty and leaflet  repair, Cox-MAZE procedure with RF ablation and Cryo Left and Right sided  lesion sets, ligation of the atrial appendage, left atrial repair and  reconstruction with bovine  pericardium.. The mitral valve has been repaired/replaced. Mild mitral  valve regurgitation. The mean mitral valve gradient is 3.0 mmHg. Procedure  Date: 2017.   5. The aortic valve is normal in structure. Aortic valve regurgitation is  not visualized. No aortic stenosis is present.   6. The inferior vena cava is normal in size with greater  than 50%  respiratory variability, suggesting right atrial pressure of 3 mmHg.    CHA2DS2-VASc Score = 4  The patient's score is based upon: CHF History: 0 HTN History: 1 Diabetes History: 0 Stroke History: 0 Vascular Disease History: 1 Age Score: 1 Gender Score: 1       ASSESSMENT AND PLAN: Longstanding Persistent Atrial Fibrillation (ICD10:  I48.11) The patient's CHA2DS2-VASc score is 4, indicating a 4.8% annual risk of stroke.   S/p MAZE  She is back in persistent afib, symptomatic We discussed rhythm control options today. Will plan for repeat DCCV since she has loaded on amiodarone  longer. Check bmet/cbc Continue Eliquis  5 mg BID Continue amiodarone  200 mg daily Continue Lopressor  50 mg BID  Secondary Hypercoagulable State (ICD10:  D68.69) The patient is at significant risk for stroke/thromboembolism based upon her CHA2DS2-VASc Score of 4.  Continue Apixaban  (Eliquis ). No bleeding issues.   High Risk Medication Monitoring (ICD 10: J342684) Patient requires ongoing monitoring for  anti-arrhythmic medication which has the potential to cause life threatening arrhythmias. Intervals on ECG acceptable for amiodarone  monitoring.   HTN Stable on current regimen  Lower extremity edema Patient will take Lasix  20 mg daily until DCCV. Then return to PRN dosing.    Follow up in the AF clinic post DCCV.    Informed Consent   Shared Decision Making/Informed Consent The risks (stroke, cardiac arrhythmias rarely resulting in the need for a temporary or permanent pacemaker, skin irritation or burns and complications associated with conscious sedation including aspiration, arrhythmia, respiratory failure and death), benefits (restoration of normal sinus rhythm) and alternatives of a direct current cardioversion were explained in detail to Ms. Tapia and she agrees to proceed.       Daril Kicks PA-C Afib Clinic [facility] [address] [place], [place]  [address] [phone]

## 2023-12-04 NOTE — H&P (View-Only) (Signed)
 Primary Care Physician: Clemmie Nest, MD Primary Cardiologist: None Electrophysiologist: Will Gladis Norton, MD  Referring Physician: Dr. Norton [name] is a 73 y.o. female with a history of HTN, HLD, aortic atherosclerosis by CT imaging, mitral valve repair with surgical maze and left atrial appendage clipping, tachybrady syndrome s/p Medtronic dual-chamber pacemaker in 2019, atrial flutter, and persistent atrial fibrillation who presents for follow up in the [facility]. She was previously on Multaq . Device interrogation by Dr. Norton showed Afib with poor ventricular response and patient started on diltiazem  180 mg daily. Patient is on Eliquis  for stroke prevention.   On evaluation today, she is currently in Afib. She notes overall hesitation to proceed with AAD therapy. She has been compliant with Eliquis . She notes to really not feel bad when in Afib and also wonders what would happen if she did nothing to treat her Afib.   On follow up 09/16/23, she is currently in Afib. Recent device interrogation showed persistent Afib with controlled rates. She has not missed any doses of Eliquis .  Follow up 8/15. Patient returns for follow up for atrial fibrillation and amiodarone  monitoring. She went back into afib on 11/20/23 after a DCCV on 7/15. There were no specific triggers that she could identify. She did feel more energetic and less SOB when in SR. She is scheduled for an ablation with Dr Norton in October. She is also having increased lower extremity edema.   Today, she  denies symptoms of palpitations, chest pain, orthopnea, PND, dizziness, presyncope, syncope, snoring, daytime somnolence, bleeding, or neurologic sequela. The patient is tolerating medications without difficulties and is otherwise without complaint today.    she has a BMI of Body mass index is 34.69 kg/m.SABRA Filed Weights   12/04/23 0925  Weight: 80.6 kg     Current  Outpatient Medications  Medication Sig Dispense Refill   acetaminophen  (TYLENOL ) 650 MG CR tablet Take 650 mg by mouth every 8 (eight) hours as needed for pain. (Patient taking differently: Take 650 mg by mouth as needed for pain.)     ALPRAZolam (XANAX XR) 0.5 MG 24 hr tablet Take 0.5 mg by mouth daily as needed for anxiety.     amiodarone  (PACERONE ) 200 MG tablet Take 2 tablets (400 mg total) TWICE a day for 2 weeks, then take 1 tablet (200 mg total) TWICE a day for 2 weeks, then take 1 tablet (200 mg total) ONCE a day thereafter (Patient taking differently: Taking 1 tablet by mouth daily) 90 tablet 2   amoxicillin  (AMOXIL ) 500 MG tablet Take 2 grams (4 tablets) by mouth once 30-60 minutes prior to dental procedure 4 tablet 0   apixaban  (ELIQUIS ) 5 MG TABS tablet Take 5 mg by mouth 2 (two) times a day.      Ascorbic Acid (VITAMIN C) 1000 MG tablet Take 1,000 mg by mouth daily.     atorvastatin  (LIPITOR) 40 MG tablet Take 40 mg by mouth every evening.      Calcium  Carbonate-Vit D-Min (CALCIUM  1200 PO) Take 1 tablet by mouth daily.     cholecalciferol (VITAMIN D3) 25 MCG (1000 UNIT) tablet Take 1,000 Units by mouth daily.     dapagliflozin propanediol (FARXIGA) 10 MG TABS tablet Take 10 mg by mouth daily.     diltiazem  (CARDIZEM  CD) 180 MG 24 hr capsule Take 1 capsule (180 mg total) by mouth daily. 90 capsule 2   furosemide  (LASIX ) 20 MG tablet Take  20 mg by mouth daily as needed for fluid or edema.     metoprolol  tartrate (LOPRESSOR ) 50 MG tablet Take 1 tablet (50 mg total) by mouth 2 (two) times daily. 180 tablet 3   mupirocin ointment (BACTROBAN) 2 % Apply topically 2 (two) times daily.     telmisartan  (MICARDIS ) 80 MG tablet Take 80 mg by mouth daily.     traMADol  (ULTRAM ) 50 MG tablet Take 50 mg by mouth every 8 (eight) hours as needed. (Patient taking differently: Take 50 mg by mouth as needed.)     No current facility-administered medications for this encounter.    Atrial Fibrillation  Management history:  Previous antiarrhythmic drugs: Multaq , amiodarone  Previous cardioversions: 07/09/22, 11/03/23 Previous ablations: none Anticoagulation history: Eliquis    ROS- All systems are reviewed and negative except as per the HPI above.  Physical Exam: BP (!) 176/100   Pulse 66   Ht 5' (1.524 m)   Wt 80.6 kg   BMI 34.69 kg/m   GEN: Well nourished, well developed in no acute distress NECK: No JVD CARDIAC: Irregularly irregular rate and rhythm, no murmurs, rubs, gallops RESPIRATORY:  Clear to auscultation without rales, wheezing or rhonchi  ABDOMEN: Soft, non-tender, non-distended EXTREMITIES:  1-2+ bilateral lower extremity edema, No deformity    EKG today demonstrates  Afib with occasional V pacing Vent. rate 66 BPM PR interval * ms QRS duration 88 ms QT/QTcB 446/467 ms   Echo 07/24/21 demonstrated   1. Left ventricular ejection fraction, by estimation, is 50 to 55%. The  left ventricle has low normal function. The left ventricle has no regional  wall motion abnormalities. Left ventricular diastolic parameters are  consistent with Grade II diastolic  dysfunction (pseudonormalization).   2. Right ventricular systolic function is normal. The right ventricular  size is normal. There is mildly elevated pulmonary artery systolic  pressure.   3. Left atrial size was severely dilated.   4. Mitral Valve Repair with 30 mm Physio ring annuloplasty and leaflet  repair, Cox-MAZE procedure with RF ablation and Cryo Left and Right sided  lesion sets, ligation of the atrial appendage, left atrial repair and  reconstruction with bovine  pericardium.. The mitral valve has been repaired/replaced. Mild mitral  valve regurgitation. The mean mitral valve gradient is 3.0 mmHg. Procedure  Date: 2017.   5. The aortic valve is normal in structure. Aortic valve regurgitation is  not visualized. No aortic stenosis is present.   6. The inferior vena cava is normal in size with greater  than 50%  respiratory variability, suggesting right atrial pressure of 3 mmHg.    CHA2DS2-VASc Score = 4  The patient's score is based upon: CHF History: 0 HTN History: 1 Diabetes History: 0 Stroke History: 0 Vascular Disease History: 1 Age Score: 1 Gender Score: 1       ASSESSMENT AND PLAN: Longstanding Persistent Atrial Fibrillation (ICD10:  I48.11) The patient's CHA2DS2-VASc score is 4, indicating a 4.8% annual risk of stroke.   S/p MAZE  She is back in persistent afib, symptomatic We discussed rhythm control options today. Will plan for repeat DCCV since she has loaded on amiodarone  longer. Check bmet/cbc Continue Eliquis  5 mg BID Continue amiodarone  200 mg daily Continue Lopressor  50 mg BID  Secondary Hypercoagulable State (ICD10:  D68.69) The patient is at significant risk for stroke/thromboembolism based upon her CHA2DS2-VASc Score of 4.  Continue Apixaban  (Eliquis ). No bleeding issues.   High Risk Medication Monitoring (ICD 10: J342684) Patient requires ongoing monitoring for  anti-arrhythmic medication which has the potential to cause life threatening arrhythmias. Intervals on ECG acceptable for amiodarone  monitoring.   HTN Stable on current regimen  Lower extremity edema Patient will take Lasix  20 mg daily until DCCV. Then return to PRN dosing.    Follow up in the AF clinic post DCCV.    Informed Consent   Shared Decision Making/Informed Consent The risks (stroke, cardiac arrhythmias rarely resulting in the need for a temporary or permanent pacemaker, skin irritation or burns and complications associated with conscious sedation including aspiration, arrhythmia, respiratory failure and death), benefits (restoration of normal sinus rhythm) and alternatives of a direct current cardioversion were explained in detail to Ms. Halladay and she agrees to proceed.       Daril Kicks PA-C Afib Clinic [facility] [address] [place], [place]  [address] [phone]

## 2023-12-05 LAB — BASIC METABOLIC PANEL WITH GFR
BUN/Creatinine Ratio: 10 — ABNORMAL LOW (ref 12–28)
BUN: 17 mg/dL (ref 8–27)
CO2: 24 mmol/L (ref 20–29)
Calcium: 9.2 mg/dL (ref 8.7–10.3)
Chloride: 108 mmol/L — ABNORMAL HIGH (ref 96–106)
Creatinine, Ser: 1.69 mg/dL — ABNORMAL HIGH (ref 0.57–1.00)
Glucose: 108 mg/dL — ABNORMAL HIGH (ref 70–99)
Potassium: 3.8 mmol/L (ref 3.5–5.2)
Sodium: 149 mmol/L — ABNORMAL HIGH (ref 134–144)
eGFR: 32 mL/min/1.73 — ABNORMAL LOW (ref >59)

## 2023-12-05 LAB — CBC
Hematocrit: 40.3 % (ref 34.0–46.6)
Hemoglobin: 13.4 g/dL (ref 11.1–15.9)
MCH: 31.9 pg (ref 26.6–33.0)
MCHC: 33.3 g/dL (ref 31.5–35.7)
MCV: 96 fL (ref 79–97)
Platelets: 202 x10E3/uL (ref 150–450)
RBC: 4.2 x10E6/uL (ref 3.77–5.28)
RDW: 14.3 % (ref 11.7–15.4)
WBC: 5.2 x10E3/uL (ref 3.4–10.8)

## 2023-12-07 ENCOUNTER — Ambulatory Visit (HOSPITAL_COMMUNITY): Payer: Self-pay | Admitting: Physician Assistant

## 2023-12-07 DIAGNOSIS — S5001XA Contusion of right elbow, initial encounter: Secondary | ICD-10-CM | POA: Diagnosis not present

## 2023-12-07 NOTE — Progress Notes (Signed)
 Pt called for pre procedure instructions.  Left message for pt to call us  back for instructions.Unidentified voicemail. Arrival time 1000 NPO after midnight explained Instructed to take am meds with sip of water and confirmed blood thinner consistency Instructed pt need for ride home tomorrow and have responsible adult with them for 24 hrs post procedure.

## 2023-12-08 ENCOUNTER — Ambulatory Visit (HOSPITAL_COMMUNITY)
Admission: RE | Admit: 2023-12-08 | Discharge: 2023-12-08 | Disposition: A | Discharge status: Home / Self Care | Attending: Internal Medicine | Admitting: Internal Medicine

## 2023-12-08 ENCOUNTER — Encounter (HOSPITAL_COMMUNITY): Payer: Self-pay | Admitting: Internal Medicine

## 2023-12-08 ENCOUNTER — Ambulatory Visit (HOSPITAL_COMMUNITY): Admitting: Anesthesiology

## 2023-12-08 ENCOUNTER — Other Ambulatory Visit: Payer: Self-pay

## 2023-12-08 ENCOUNTER — Encounter (HOSPITAL_COMMUNITY)
Admission: RE | Disposition: A | Discharge status: Home / Self Care | Payer: Self-pay | Source: Home / Self Care | Attending: Internal Medicine

## 2023-12-08 DIAGNOSIS — Z950 Presence of cardiac pacemaker: Secondary | ICD-10-CM | POA: Insufficient documentation

## 2023-12-08 DIAGNOSIS — I4811 Longstanding persistent atrial fibrillation: Secondary | ICD-10-CM | POA: Insufficient documentation

## 2023-12-08 DIAGNOSIS — I4891 Unspecified atrial fibrillation: Secondary | ICD-10-CM

## 2023-12-08 DIAGNOSIS — Z7901 Long term (current) use of anticoagulants: Secondary | ICD-10-CM | POA: Insufficient documentation

## 2023-12-08 DIAGNOSIS — I509 Heart failure, unspecified: Secondary | ICD-10-CM | POA: Diagnosis not present

## 2023-12-08 DIAGNOSIS — I4892 Unspecified atrial flutter: Secondary | ICD-10-CM | POA: Insufficient documentation

## 2023-12-08 DIAGNOSIS — I700 Atherosclerosis of aorta: Secondary | ICD-10-CM | POA: Diagnosis not present

## 2023-12-08 DIAGNOSIS — I110 Hypertensive heart disease with heart failure: Secondary | ICD-10-CM

## 2023-12-08 DIAGNOSIS — I495 Sick sinus syndrome: Secondary | ICD-10-CM | POA: Insufficient documentation

## 2023-12-08 DIAGNOSIS — Z79899 Other long term (current) drug therapy: Secondary | ICD-10-CM | POA: Insufficient documentation

## 2023-12-08 DIAGNOSIS — E785 Hyperlipidemia, unspecified: Secondary | ICD-10-CM | POA: Insufficient documentation

## 2023-12-08 DIAGNOSIS — R600 Localized edema: Secondary | ICD-10-CM | POA: Insufficient documentation

## 2023-12-08 DIAGNOSIS — I4819 Other persistent atrial fibrillation: Secondary | ICD-10-CM

## 2023-12-08 DIAGNOSIS — I10 Essential (primary) hypertension: Secondary | ICD-10-CM | POA: Diagnosis not present

## 2023-12-08 DIAGNOSIS — D6869 Other thrombophilia: Secondary | ICD-10-CM | POA: Insufficient documentation

## 2023-12-08 HISTORY — PX: CARDIOVERSION: EP1203

## 2023-12-08 SURGERY — CARDIOVERSION (CATH LAB)
Anesthesia: General

## 2023-12-08 MED ORDER — SODIUM CHLORIDE 0.9 % IV SOLN: INTRAVENOUS | Status: DC

## 2023-12-08 MED ORDER — PROPOFOL 10 MG/ML IV BOLUS
INTRAVENOUS | Status: DC | PRN
  Administered 2023-12-08: 60 mg via INTRAVENOUS

## 2023-12-08 MED ORDER — SODIUM CHLORIDE 0.9% FLUSH
INTRAVENOUS | Status: DC | PRN
  Administered 2023-12-08: 10 mL via INTRAVENOUS

## 2023-12-08 SURGICAL SUPPLY — 1 items: PAD DEFIB RADIO PHYSIO CONN (PAD) ×1 IMPLANT

## 2023-12-08 NOTE — Anesthesia Postprocedure Evaluation (Signed)
 Anesthesia Post Note  Patient: Isabel Hardy  Procedure(s) Performed: CARDIOVERSION     Patient location during evaluation: PACU Anesthesia Type: General Level of consciousness: awake Pain management: pain level controlled Vital Signs Assessment: post-procedure vital signs reviewed and stable Respiratory status: spontaneous breathing, nonlabored ventilation and respiratory function stable Cardiovascular status: blood pressure returned to baseline and stable Postop Assessment: no apparent nausea or vomiting Anesthetic complications: no   No notable events documented.  Last Vitals:  Vitals:   12/08/23 1137 12/08/23 1147  BP: (!) 177/90 (!) 185/90  Pulse: 60 61  Resp: 16 16  Temp:    SpO2: 93% 96%    Last Pain:  Vitals:   12/08/23 1147  TempSrc:   PainSc: 0-No pain                 Delon Aisha Arch

## 2023-12-08 NOTE — Interval H&P Note (Signed)
 History and Physical Interval Note:  12/08/2023 10:43 AM  Isabel Hardy  has presented today for surgery, with the diagnosis of AFIB.  The various methods of treatment have been discussed with the patient and family. After consideration of risks, benefits and other options for treatment, the patient has consented to  Procedure(s): CARDIOVERSION (N/A) as a surgical intervention.  The patient's history has been reviewed, patient examined, no change in status, stable for surgery.  I have reviewed the patient's chart and labs.  Questions were answered to the patient's satisfaction.     Vinie JAYSON Maxcy

## 2023-12-08 NOTE — Discharge Instructions (Signed)

## 2023-12-08 NOTE — Transfer of Care (Signed)
 Immediate Anesthesia Transfer of Care Note  Patient: Isabel Hardy  Procedure(s) Performed: CARDIOVERSION  Patient Location: PACU and Cath Lab  Anesthesia Type:MAC  Level of Consciousness: drowsy  Airway & Oxygen Therapy: Patient Spontanous Breathing and Patient connected to nasal cannula oxygen  Post-op Assessment: Report given to RN and Post -op Vital signs reviewed and stable  Post vital signs: Reviewed and stable  Last Vitals:  Vitals Value Taken Time  BP    Temp    Pulse    Resp    SpO2      Last Pain:  Vitals:   12/08/23 1113  TempSrc:   PainSc: 0-No pain         Complications: No notable events documented.

## 2023-12-08 NOTE — Anesthesia Preprocedure Evaluation (Addendum)
 Anesthesia Evaluation  Patient identified by MRN, date of birth, ID band Patient awake    Reviewed: Allergy & Precautions, NPO status , Patient's Chart, lab work & pertinent test results  History of Anesthesia Complications Negative for: history of anesthetic complications  Airway Mallampati: III  TM Distance: >3 FB Neck ROM: Full    Dental  (+) Dental Advisory Given,    Pulmonary neg pulmonary ROS   Pulmonary exam normal breath sounds clear to auscultation       Cardiovascular hypertension (metoprolol , telmisartan ), Pt. on home beta blockers and Pt. on medications (-) angina +CHF  (-) Past MI, (-) Cardiac Stents and (-) CABG + dysrhythmias Atrial Fibrillation + pacemaker + Valvular Problems/Murmurs (h/o MV repair)  Rhythm:Irregular Rate:Normal  HLD  TTE 07/25/2021: IMPRESSIONS    1. Left ventricular ejection fraction, by estimation, is 50 to 55%. The  left ventricle has low normal function. The left ventricle has no regional  wall motion abnormalities. Left ventricular diastolic parameters are  consistent with Grade II diastolic  dysfunction (pseudonormalization).   2. Right ventricular systolic function is normal. The right ventricular  size is normal. There is mildly elevated pulmonary artery systolic  pressure.   3. Left atrial size was severely dilated.   4. Mitral Valve Repair with 30 mm Physio ring annuloplasty and leaflet  repair, Cox-MAZE procedure with RF ablation and Cryo Left and Right sided  lesion sets, ligation of the atrial appendage, left atrial repair and  reconstruction with bovine  pericardium.. The mitral valve has been repaired/replaced. Mild mitral  valve regurgitation. The mean mitral valve gradient is 3.0 mmHg. Procedure  Date: 2017.   5. The aortic valve is normal in structure. Aortic valve regurgitation is  not visualized. No aortic stenosis is present.   6. The inferior vena cava is normal in  size with greater than 50%  respiratory variability, suggesting right atrial pressure of 3 mmHg.     Neuro/Psych neg Seizures  Neuromuscular disease (lumbar spinal stenosis) CVA (2006), No Residual Symptoms    GI/Hepatic negative GI ROS, Neg liver ROS,,,  Endo/Other  negative endocrine ROS    Renal/GU Renal disease     Musculoskeletal   Abdominal  (+) + obese  Peds  Hematology negative hematology ROS (+) Lab Results      Component                Value               Date                      WBC                      5.2                 12/04/2023                HGB                      13.4                12/04/2023                HCT                      40.3                12/04/2023  MCV                      96                  12/04/2023                PLT                      202                 12/04/2023              Anesthesia Other Findings Last Farxiga: 12/04/2023  Last Eliquis : this morning  Reproductive/Obstetrics                              Anesthesia Physical Anesthesia Plan  ASA: 3  Anesthesia Plan: General   Post-op Pain Management: Minimal or no pain anticipated   Induction: Intravenous  PONV Risk Score and Plan: 3 and TIVA and Treatment may vary due to age or medical condition  Airway Management Planned: Natural Airway and Nasal Cannula  Additional Equipment:   Intra-op Plan:   Post-operative Plan:   Informed Consent: I have reviewed the patients History and Physical, chart, labs and discussed the procedure including the risks, benefits and alternatives for the proposed anesthesia with the patient or authorized representative who has indicated his/her understanding and acceptance.     Dental advisory given  Plan Discussed with: CRNA and Anesthesiologist  Anesthesia Plan Comments: (Risks of general anesthesia discussed including, but not limited to, sore throat, hoarse voice, chipped/damaged teeth,  injury to vocal cords, nausea and vomiting, allergic reactions, lung infection, heart attack, stroke, and death. All questions answered. )         Anesthesia Quick Evaluation

## 2023-12-08 NOTE — CV Procedure (Signed)
    CARDIOVERSION NOTE  Procedure: Electrical Cardioversion Indications:  Atrial Fibrillation  Procedure Details:  Consent: Risks of procedure as well as the alternatives and risks of each were explained to the (patient/caregiver).  Consent for procedure obtained.  Time Out: Verified patient identification, verified procedure, site/side was marked, verified correct patient position, special equipment/implants available, medications/allergies/relevent history reviewed, required imaging and test results available.  Performed  Pacemaker interrogated prior to the procedure and during the procedure wirelessly.  Patient placed on cardiac monitor, pulse oximetry, supplemental oxygen as necessary.  Sedation given: propofol  per anesthesia Pacer pads placed anterior and posterior chest.  Cardioverted 2 time(s).  Cardioverted at 200J and 300J biphasic.  Impression: Findings: Post procedure EKG shows: A-sense, V-paced rhythm Complications: None Patient did tolerate procedure well.  Plan: Successful DCCV after 2 stacked shocks to A-sense, v-paced rhythm. Her blood pressure recently has been significantly elevated, including at wound clinic visits. This will require medication adjustment by her PCP or primary cardiologist.  Time Spent Directly with the Patient:  30 minutes   Isabel KYM Maxcy, MD, [facility], FNLA, FACP  [place]  [facility]  Medical Director of the Advanced Lipid Disorders &  Cardiovascular Risk Reduction Clinic Diplomate of the American Board of Clinical Lipidology Attending Cardiologist  Direct Dial: [phone]  Fax: [phone]  Website:  [email or web address] Isabel Hardy 12/08/2023, 11:25 AM

## 2023-12-11 DIAGNOSIS — S51001A Unspecified open wound of right elbow, initial encounter: Secondary | ICD-10-CM | POA: Diagnosis not present

## 2023-12-14 DIAGNOSIS — S5001XA Contusion of right elbow, initial encounter: Secondary | ICD-10-CM | POA: Diagnosis not present

## 2023-12-22 ENCOUNTER — Ambulatory Visit (HOSPITAL_COMMUNITY)
Admission: RE | Admit: 2023-12-22 | Discharge: 2023-12-22 | Disposition: A | Discharge status: Home / Self Care | Source: Ambulatory Visit | Attending: Internal Medicine | Admitting: Internal Medicine

## 2023-12-22 ENCOUNTER — Encounter (HOSPITAL_COMMUNITY): Payer: Self-pay | Admitting: Internal Medicine

## 2023-12-22 VITALS — BP 162/100 | HR 70 | Ht 60.0 in | Wt 177.4 lb

## 2023-12-22 DIAGNOSIS — I4811 Longstanding persistent atrial fibrillation: Secondary | ICD-10-CM | POA: Diagnosis not present

## 2023-12-22 DIAGNOSIS — Z5181 Encounter for therapeutic drug level monitoring: Secondary | ICD-10-CM

## 2023-12-22 DIAGNOSIS — D6869 Other thrombophilia: Secondary | ICD-10-CM

## 2023-12-22 DIAGNOSIS — Z79899 Other long term (current) drug therapy: Secondary | ICD-10-CM

## 2023-12-22 DIAGNOSIS — I4819 Other persistent atrial fibrillation: Secondary | ICD-10-CM

## 2023-12-22 NOTE — Progress Notes (Signed)
 Primary Care Physician: Clemmie Nest, MD Primary Cardiologist: None Electrophysiologist: Will Gladis Norton, MD  Referring Physician: Dr. Norton Isabel Hardy Isabel Hardy is a 73 y.o. female with a history of HTN, HLD, aortic atherosclerosis by CT imaging, mitral valve repair with surgical maze and left atrial appendage clipping, tachybrady syndrome s/p Medtronic dual-chamber pacemaker in 2019, atrial flutter, and persistent atrial fibrillation who presents for follow up in the [facility]. She was previously on Multaq . Device interrogation by Dr. Norton showed Afib with poor ventricular response and patient started on diltiazem  180 mg daily. Patient is on Eliquis  for stroke prevention.   On evaluation today, she is currently in Afib. She notes overall hesitation to proceed with AAD therapy. She has been compliant with Eliquis . She notes to really not feel bad when in Afib and also wonders what would happen if she did nothing to treat her Afib.   On follow up 09/16/23, she is currently in Afib. Recent device interrogation showed persistent Afib with controlled rates. She has not missed any doses of Eliquis .  Follow up 8/15. Patient returns for follow up for atrial fibrillation and amiodarone  monitoring. She went back into afib on 11/20/23 after a DCCV on 7/15. There were no specific triggers that she could identify. She did feel more energetic and less SOB when in SR. She is scheduled for an ablation with Dr Norton in October. She is also having increased lower extremity edema.   On follow up 12/22/23, patient is currently in V paced rhythm. S/p successful DCCV on 8/19. No missed doses of amiodarone  or Eliquis .   Today, she  denies symptoms of palpitations, chest pain, orthopnea, PND, dizziness, presyncope, syncope, snoring, daytime somnolence, bleeding, or neurologic sequela. The patient is tolerating medications without difficulties and is otherwise without  complaint today.    she has a BMI of Body mass index is 34.65 kg/m.SABRA Filed Weights   12/22/23 1043  Weight: 80.5 kg      Current Outpatient Medications  Medication Sig Dispense Refill   acetaminophen  (TYLENOL ) 650 MG CR tablet Take 650 mg by mouth every 8 (eight) hours as needed for pain.     ALPRAZolam (XANAX XR) 0.5 MG 24 hr tablet Take 0.5 mg by mouth daily as needed for anxiety.     amiodarone  (PACERONE ) 200 MG tablet Take 2 tablets (400 mg total) TWICE a day for 2 weeks, then take 1 tablet (200 mg total) TWICE a day for 2 weeks, then take 1 tablet (200 mg total) ONCE a day thereafter 90 tablet 2   amoxicillin  (AMOXIL ) 500 MG tablet Take 2 grams (4 tablets) by mouth once 30-60 minutes prior to dental procedure 4 tablet 0   apixaban  (ELIQUIS ) 5 MG TABS tablet Take 5 mg by mouth 2 (two) times a day.      Ascorbic Acid (VITAMIN C) 1000 MG tablet Take 1,000 mg by mouth daily.     atorvastatin  (LIPITOR) 40 MG tablet Take 40 mg by mouth every evening.      Calcium  Carbonate-Vit D-Min (CALCIUM  1200 PO) Take 1 tablet by mouth daily.     cholecalciferol (VITAMIN D3) 25 MCG (1000 UNIT) tablet Take 1,000 Units by mouth daily.     dapagliflozin propanediol (FARXIGA) 10 MG TABS tablet Take 10 mg by mouth daily.     diltiazem  (CARDIZEM  CD) 180 MG 24 hr capsule Take 1 capsule (180 mg total) by mouth daily. 90 capsule 2   furosemide  (  LASIX ) 20 MG tablet Take 20 mg by mouth every other day.     metoprolol  tartrate (LOPRESSOR ) 50 MG tablet Take 1 tablet (50 mg total) by mouth 2 (two) times daily. (Patient taking differently: Take 100 mg by mouth 2 (two) times daily.) 180 tablet 3   telmisartan  (MICARDIS ) 80 MG tablet Take 80 mg by mouth daily.     traMADol  (ULTRAM ) 50 MG tablet Take 50 mg by mouth every 8 (eight) hours as needed for moderate pain (pain score 4-6).     No current facility-administered medications for this encounter.    Atrial Fibrillation Management history:  Previous  antiarrhythmic drugs: Multaq , amiodarone  Previous cardioversions: 07/09/22, 11/03/23, 12/08/23 Previous ablations: none Anticoagulation history: Eliquis    ROS- All systems are reviewed and negative except as per the HPI above.  Physical Exam: BP (!) 162/100   Pulse 70   Ht 5' (1.524 m)   Wt 80.5 kg   BMI 34.65 kg/m   GEN- The patient is well appearing, alert and oriented x 3 today.   Neck - no JVD or carotid bruit noted Lungs- Clear to ausculation bilaterally, normal work of breathing Heart- Regular rate (V paced), no murmurs, rubs or gallops, PMI not laterally displaced Extremities- no clubbing, cyanosis, or edema Skin - no rash or ecchymosis noted   EKG today demonstrates  Vent. rate 70 BPM PR interval * ms QRS duration 162 ms QT/QTcB 486/524 ms P-R-T axes * -87 113 Ventricular-paced rhythm Abnormal ECG When compared with ECG of 08-Dec-2023 11:57, Electronic ventricular pacemaker has replaced Electronic atrial pacemaker  Echo 07/24/21 demonstrated   1. Left ventricular ejection fraction, by estimation, is 50 to 55%. The  left ventricle has low normal function. The left ventricle has no regional  wall motion abnormalities. Left ventricular diastolic parameters are  consistent with Grade II diastolic  dysfunction (pseudonormalization).   2. Right ventricular systolic function is normal. The right ventricular  size is normal. There is mildly elevated pulmonary artery systolic  pressure.   3. Left atrial size was severely dilated.   4. Mitral Valve Repair with 30 mm Physio ring annuloplasty and leaflet  repair, Cox-MAZE procedure with RF ablation and Cryo Left and Right sided  lesion sets, ligation of the atrial appendage, left atrial repair and  reconstruction with bovine  pericardium.. The mitral valve has been repaired/replaced. Mild mitral  valve regurgitation. The mean mitral valve gradient is 3.0 mmHg. Procedure  Date: 2017.   5. The aortic valve is normal in  structure. Aortic valve regurgitation is  not visualized. No aortic stenosis is present.   6. The inferior vena cava is normal in size with greater than 50%  respiratory variability, suggesting right atrial pressure of 3 mmHg.    CHA2DS2-VASc Score = 4  The patient's score is based upon: CHF History: 0 HTN History: 1 Diabetes History: 0 Stroke History: 0 Vascular Disease History: 1 Age Score: 1 Gender Score: 1       ASSESSMENT AND PLAN: Longstanding Persistent Atrial Fibrillation (ICD10:  I48.11) The patient's CHA2DS2-VASc score is 4, indicating a 4.8% annual risk of stroke.   S/p MAZE  S/p DCCV on 12/08/23.  Patient is currently in V paced rhythm. We discussed at this point continuing amiodarone  200 mg daily through ablation.   Secondary Hypercoagulable State (ICD10:  D68.69) The patient is at significant risk for stroke/thromboembolism based upon her CHA2DS2-VASc Score of 4.  Continue Apixaban  (Eliquis ).  No missed doses.  High risk medication monitoring (ICD10:  S20.100) Patient requires ongoing monitoring for anti-arrhythmic medication which has the potential to cause life threatening arrhythmias or AV block. Qtc stable. Continue amiodarone  200 mg daily.   HTN Elevated today, patient is going to discuss with PCP this Thursday.   Lower extremity edema Improved since cardioversion   Follow up as scheduled for ablation in October.     Dorn Heinrich, PA-C Afib Clinic [facility] [address] [place], [place] [address] [phone]

## 2023-12-24 DIAGNOSIS — Z6833 Body mass index (BMI) 33.0-33.9, adult: Secondary | ICD-10-CM | POA: Diagnosis not present

## 2023-12-24 DIAGNOSIS — N1832 Chronic kidney disease, stage 3b: Secondary | ICD-10-CM | POA: Diagnosis not present

## 2023-12-24 DIAGNOSIS — Z79899 Other long term (current) drug therapy: Secondary | ICD-10-CM | POA: Diagnosis not present

## 2023-12-24 DIAGNOSIS — I4891 Unspecified atrial fibrillation: Secondary | ICD-10-CM | POA: Diagnosis not present

## 2023-12-24 DIAGNOSIS — M810 Age-related osteoporosis without current pathological fracture: Secondary | ICD-10-CM | POA: Diagnosis not present

## 2023-12-24 DIAGNOSIS — Z1339 Encounter for screening examination for other mental health and behavioral disorders: Secondary | ICD-10-CM | POA: Diagnosis not present

## 2023-12-24 DIAGNOSIS — S5001XA Contusion of right elbow, initial encounter: Secondary | ICD-10-CM | POA: Diagnosis not present

## 2023-12-24 DIAGNOSIS — I5042 Chronic combined systolic (congestive) and diastolic (congestive) heart failure: Secondary | ICD-10-CM | POA: Diagnosis not present

## 2023-12-24 DIAGNOSIS — Z0000 Encounter for general adult medical examination without abnormal findings: Secondary | ICD-10-CM | POA: Diagnosis not present

## 2023-12-24 DIAGNOSIS — E785 Hyperlipidemia, unspecified: Secondary | ICD-10-CM | POA: Diagnosis not present

## 2023-12-24 LAB — LAB REPORT - SCANNED
A1c: 5.3
Albumin, Urine POC: 160.7
Albumin/Creatinine Ratio, Urine, POC: 205
Creatinine, POC: 78.5 mg/dL
EGFR: 24

## 2024-01-01 ENCOUNTER — Other Ambulatory Visit: Payer: Self-pay | Admitting: Cardiology

## 2024-01-01 NOTE — Telephone Encounter (Signed)
 Spoke to pt who confirms she is taking 100 mg BID.   Aware will send in new Rx that reflects this dosing.  (Increase to 100 BID occurred last summer). Patient verbalized understanding and agreeable to plan.

## 2024-01-07 ENCOUNTER — Telehealth (HOSPITAL_COMMUNITY): Payer: Self-pay

## 2024-01-07 NOTE — Telephone Encounter (Signed)
 Attempted to reach patient to discuss upcoming procedure, no answer. Left VM for patient to return call.

## 2024-01-07 NOTE — Telephone Encounter (Signed)
 Spoke with patient to complete pre-procedure call.     Health status review:  Any new medical conditions, recent signs of acute illness or been started on antibiotics? No Any recent hospitalizations or surgeries? DCCV 12/08/23 Any new medications started since pre-op visit? Yes- Prolia   Follow all medication instructions prior to procedure or the procedure may be rescheduled:    Continue taking Eliquis  (Apixaban ) twice daily without missing any doses before procedure. Essential chronic medications:  No medication should be continued, unless told otherwise. HOLD Farxiga for 3 days prior to your procedure. Last dose on October 11. On the morning of your procedure DO NOT take any medication., including Eliquis  (Apixaban ).  Nothing to eat or drink after midnight prior to your procedure.  Pre-procedure testing scheduled: lab work by October 1.  Confirmed patient is scheduled for Atrial Fibrillation Ablation on Wednesday, October 15 with Dr. Soyla Norton. Instructed patient to arrive at the Main Entrance A at [facility]: [address] [place], [place] [address] and check in at Admitting at 9:00 AM.  Advised of plan to go home the same day and will only stay overnight if medically necessary. You MUST have a responsible adult to drive you home and MUST be with you the first 24 hours after you arrive home or your procedure could be cancelled.  Informed patient a nurse will call a day before the procedure to confirm arrival time and ensure instructions are followed.  Patient verbalized understanding to information provided and is agreeable to proceed with procedure.   Advised patient to contact RN Navigator at [phone], to inform of any new medications started after call or concerns prior to procedure.

## 2024-01-08 DIAGNOSIS — S5001XA Contusion of right elbow, initial encounter: Secondary | ICD-10-CM | POA: Diagnosis not present

## 2024-01-11 DIAGNOSIS — M810 Age-related osteoporosis without current pathological fracture: Secondary | ICD-10-CM | POA: Diagnosis not present

## 2024-01-15 DIAGNOSIS — S5001XA Contusion of right elbow, initial encounter: Secondary | ICD-10-CM | POA: Diagnosis not present

## 2024-01-18 DIAGNOSIS — J40 Bronchitis, not specified as acute or chronic: Secondary | ICD-10-CM | POA: Diagnosis not present

## 2024-01-18 DIAGNOSIS — J329 Chronic sinusitis, unspecified: Secondary | ICD-10-CM | POA: Diagnosis not present

## 2024-01-18 DIAGNOSIS — I4819 Other persistent atrial fibrillation: Secondary | ICD-10-CM | POA: Diagnosis not present

## 2024-01-18 DIAGNOSIS — Z6833 Body mass index (BMI) 33.0-33.9, adult: Secondary | ICD-10-CM | POA: Diagnosis not present

## 2024-01-18 DIAGNOSIS — I4891 Unspecified atrial fibrillation: Secondary | ICD-10-CM | POA: Diagnosis not present

## 2024-01-18 DIAGNOSIS — I110 Hypertensive heart disease with heart failure: Secondary | ICD-10-CM | POA: Diagnosis not present

## 2024-01-18 DIAGNOSIS — I495 Sick sinus syndrome: Secondary | ICD-10-CM | POA: Diagnosis not present

## 2024-01-19 ENCOUNTER — Other Ambulatory Visit: Payer: Self-pay | Admitting: Cardiology

## 2024-01-19 LAB — BASIC METABOLIC PANEL WITH GFR
BUN/Creatinine Ratio: 11 — AB (ref 12–28)
BUN: 20 mg/dL (ref 8–27)
CO2: 21 mmol/L (ref 20–29)
Calcium: 8.2 mg/dL — AB (ref 8.7–10.3)
Chloride: 108 mmol/L — AB (ref 96–106)
Creatinine, Ser: 1.75 mg/dL — AB (ref 0.57–1.00)
Glucose: 138 mg/dL — AB (ref 70–99)
Potassium: 3.6 mmol/L (ref 3.5–5.2)
Sodium: 146 mmol/L — AB (ref 134–144)
eGFR: 31 mL/min/1.73 — AB (ref >59)

## 2024-01-19 LAB — CBC
Hematocrit: 41.2 % (ref 34.0–46.6)
Hemoglobin: 13.6 g/dL (ref 11.1–15.9)
MCH: 30.5 pg (ref 26.6–33.0)
MCHC: 33 g/dL (ref 31.5–35.7)
MCV: 92 fL (ref 79–97)
Platelets: 183 x10E3/uL (ref 150–450)
RBC: 4.46 x10E6/uL (ref 3.77–5.28)
RDW: 14 % (ref 11.7–15.4)
WBC: 5.7 x10E3/uL (ref 3.4–10.8)

## 2024-01-20 NOTE — Progress Notes (Signed)
 Remote PPM Transmission

## 2024-01-27 DIAGNOSIS — I110 Hypertensive heart disease with heart failure: Secondary | ICD-10-CM | POA: Diagnosis not present

## 2024-01-27 DIAGNOSIS — S4991XA Unspecified injury of right shoulder and upper arm, initial encounter: Secondary | ICD-10-CM | POA: Diagnosis not present

## 2024-01-27 DIAGNOSIS — N1832 Chronic kidney disease, stage 3b: Secondary | ICD-10-CM | POA: Diagnosis not present

## 2024-02-01 DIAGNOSIS — N390 Urinary tract infection, site not specified: Secondary | ICD-10-CM | POA: Diagnosis not present

## 2024-02-01 DIAGNOSIS — N1832 Chronic kidney disease, stage 3b: Secondary | ICD-10-CM | POA: Diagnosis not present

## 2024-02-02 ENCOUNTER — Telehealth (HOSPITAL_COMMUNITY): Payer: Self-pay

## 2024-02-02 NOTE — Telephone Encounter (Addendum)
 Received a call from patient requesting to verify medication instructions for ablation on tomorrow, 10/15. Instructed patient on the following items: Arrival time 0900 Nothing to eat or drink after midnight No meds AM of procedure, including Eliquis   Have you missed any doses of anti-coagulant: No Responsible person to drive you home and stay with you for 24 hrs Pt verbalized understanding to information provided.

## 2024-02-03 ENCOUNTER — Ambulatory Visit (HOSPITAL_COMMUNITY)
Admission: RE | Admit: 2024-02-03 | Discharge: 2024-02-03 | Disposition: A | Discharge status: Home / Self Care | Attending: Cardiology | Admitting: Cardiology

## 2024-02-03 ENCOUNTER — Other Ambulatory Visit: Payer: Self-pay

## 2024-02-03 ENCOUNTER — Ambulatory Visit (HOSPITAL_COMMUNITY): Admitting: Certified Registered Nurse Anesthetist

## 2024-02-03 ENCOUNTER — Ambulatory Visit (HOSPITAL_COMMUNITY)
Admission: RE | Disposition: A | Discharge status: Home / Self Care | Payer: Self-pay | Source: Home / Self Care | Attending: Cardiology

## 2024-02-03 DIAGNOSIS — I110 Hypertensive heart disease with heart failure: Secondary | ICD-10-CM | POA: Diagnosis not present

## 2024-02-03 DIAGNOSIS — I509 Heart failure, unspecified: Secondary | ICD-10-CM | POA: Insufficient documentation

## 2024-02-03 DIAGNOSIS — I483 Typical atrial flutter: Secondary | ICD-10-CM | POA: Insufficient documentation

## 2024-02-03 DIAGNOSIS — E785 Hyperlipidemia, unspecified: Secondary | ICD-10-CM | POA: Insufficient documentation

## 2024-02-03 DIAGNOSIS — I4891 Unspecified atrial fibrillation: Secondary | ICD-10-CM | POA: Diagnosis not present

## 2024-02-03 DIAGNOSIS — I495 Sick sinus syndrome: Secondary | ICD-10-CM | POA: Diagnosis not present

## 2024-02-03 DIAGNOSIS — I4819 Other persistent atrial fibrillation: Secondary | ICD-10-CM | POA: Diagnosis not present

## 2024-02-03 DIAGNOSIS — I4811 Longstanding persistent atrial fibrillation: Secondary | ICD-10-CM | POA: Insufficient documentation

## 2024-02-03 DIAGNOSIS — Z950 Presence of cardiac pacemaker: Secondary | ICD-10-CM | POA: Diagnosis not present

## 2024-02-03 HISTORY — PX: ATRIAL FIBRILLATION ABLATION: EP1191

## 2024-02-03 LAB — POCT ACTIVATED CLOTTING TIME
Activated Clotting Time: 371 s
Activated Clotting Time: 377 s

## 2024-02-03 MED ORDER — FENTANYL CITRATE (PF) 100 MCG/2ML IJ SOLN
INTRAMUSCULAR | Status: AC
  Filled 2024-02-03: qty 2

## 2024-02-03 MED ORDER — SUGAMMADEX SODIUM 200 MG/2ML IV SOLN
INTRAVENOUS | Status: DC | PRN
  Administered 2024-02-03: 200 mg via INTRAVENOUS

## 2024-02-03 MED ORDER — PROPOFOL 10 MG/ML IV BOLUS
INTRAVENOUS | Status: DC | PRN
  Administered 2024-02-03: 100 mg via INTRAVENOUS
  Administered 2024-02-03: 20 mg via INTRAVENOUS

## 2024-02-03 MED ORDER — LIDOCAINE 2% (20 MG/ML) 5 ML SYRINGE
INTRAMUSCULAR | Status: DC | PRN
Start: 2024-02-03 — End: 2024-02-03
  Administered 2024-02-03: 80 mg via INTRAVENOUS

## 2024-02-03 MED ORDER — PHENYLEPHRINE HCL-NACL 20-0.9 MG/250ML-% IV SOLN
INTRAVENOUS | Status: DC | PRN
  Administered 2024-02-03: 30 ug/min via INTRAVENOUS

## 2024-02-03 MED ORDER — ATROPINE SULFATE 1 MG/10ML IJ SOSY
PREFILLED_SYRINGE | INTRAMUSCULAR | Status: AC
  Filled 2024-02-03: qty 10

## 2024-02-03 MED ORDER — ROCURONIUM BROMIDE 10 MG/ML (PF) SYRINGE
PREFILLED_SYRINGE | INTRAVENOUS | Status: DC | PRN
  Administered 2024-02-03: 50 mg via INTRAVENOUS
  Administered 2024-02-03: 20 mg via INTRAVENOUS

## 2024-02-03 MED ORDER — ACETAMINOPHEN 325 MG PO TABS: 650.0000 mg | ORAL_TABLET | ORAL | Status: DC | PRN

## 2024-02-03 MED ORDER — HEPARIN (PORCINE) IN NACL 1000-0.9 UT/500ML-% IV SOLN
INTRAVENOUS | Status: DC | PRN
  Administered 2024-02-03 (×3): 500 mL

## 2024-02-03 MED ORDER — PROTAMINE SULFATE 10 MG/ML IV SOLN
INTRAVENOUS | Status: DC | PRN
  Administered 2024-02-03: 40 mg via INTRAVENOUS

## 2024-02-03 MED ORDER — CEFAZOLIN SODIUM-DEXTROSE 2-4 GM/100ML-% IV SOLN
INTRAVENOUS | Status: AC
  Administered 2024-02-03: 2 g
  Filled 2024-02-03: qty 100

## 2024-02-03 MED ORDER — SODIUM CHLORIDE 0.9 % IV SOLN: INTRAVENOUS | Status: DC

## 2024-02-03 MED ORDER — FENTANYL CITRATE (PF) 250 MCG/5ML IJ SOLN
INTRAMUSCULAR | Status: DC | PRN
  Administered 2024-02-03 (×2): 25 ug via INTRAVENOUS
  Administered 2024-02-03: 50 ug via INTRAVENOUS

## 2024-02-03 MED ORDER — DEXAMETHASONE SOD PHOSPHATE PF 10 MG/ML IJ SOLN
INTRAMUSCULAR | Status: DC | PRN
  Administered 2024-02-03: 4 mg via INTRAVENOUS

## 2024-02-03 MED ORDER — HEPARIN SODIUM (PORCINE) 1000 UNIT/ML IJ SOLN
INTRAMUSCULAR | Status: DC | PRN
  Administered 2024-02-03: 14000 [IU] via INTRAVENOUS

## 2024-02-03 MED ORDER — ONDANSETRON HCL 4 MG/2ML IJ SOLN
INTRAMUSCULAR | Status: DC | PRN
  Administered 2024-02-03: 4 mg via INTRAVENOUS

## 2024-02-03 MED ORDER — ONDANSETRON HCL 4 MG/2ML IJ SOLN: 4.0000 mg | Freq: Four times a day (QID) | INTRAMUSCULAR | Status: DC | PRN

## 2024-02-03 NOTE — Discharge Instructions (Signed)

## 2024-02-03 NOTE — Anesthesia Postprocedure Evaluation (Signed)
 Anesthesia Post Note  Patient: Isabel Hardy  Procedure(s) Performed: ATRIAL FIBRILLATION ABLATION     Patient location during evaluation: PACU Anesthesia Type: General Level of consciousness: awake and alert Pain management: pain level controlled Vital Signs Assessment: post-procedure vital signs reviewed and stable Respiratory status: spontaneous breathing, nonlabored ventilation, respiratory function stable and patient connected to nasal cannula oxygen Cardiovascular status: blood pressure returned to baseline and stable Postop Assessment: no apparent nausea or vomiting Anesthetic complications: no   There were no known notable events for this encounter.  Last Vitals:  Vitals:   02/03/24 1330 02/03/24 1400  BP: 129/78 (!) 141/82  Pulse: 63 69  Resp: 17 (!) 21  Temp:    SpO2: 94% 95%    Last Pain:  Vitals:   02/03/24 1120  TempSrc: Axillary  PainSc: 0-No pain   Pain Goal:                   Rome Ade

## 2024-02-03 NOTE — H&P (Signed)
  Electrophysiology Office Note:   Date:  02/03/2024  ID:  Isabel Hardy, DOB Dec 20, 1950, MRN [id number]  Primary Cardiologist: None Primary Heart Failure: None Electrophysiologist: Jacques Fife Gladis Norton, MD      History of Present Illness:   Isabel Hardy is a 73 y.o. 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   Today, denies symptoms of palpitations, chest pain, dyspnea, orthopnea, PND, lower extremity edema, claudication, dizziness, presyncope, syncope, bleeding, or neurologic sequela. The patient is tolerating medications without difficulties. Plan ablation today.     EP Information / Studies Reviewed:    EKG is not ordered today. EKG from 09/16/2023 reviewed which showed atrial fibrillation, intermittent ventricular pacing with fused complexes      PPM Interrogation-  reviewed in detail today,  See PACEART report.  Device History: Medtronic Dual Chamber PPM implanted 2019 for Tachy-Brady syndrome  Risk Assessment/Calculations:    CHA2DS2-VASc Score = 4   This indicates a 4.8% annual risk of stroke. The patient's score is based upon: CHF History: 0 HTN History: 1 Diabetes History: 0 Stroke History: 0 Vascular Disease History: 1 Age Score: 1 Gender Score: 1            Physical Exam:   VS:  There were no vitals taken for this visit.   Wt Readings from Last 3 Encounters:  12/22/23 80.5 kg  12/04/23 80.6 kg  11/03/23 76.7 kg    GEN: Well nourished, well developed in no acute distress NECK: No JVD; No carotid bruits CARDIAC: irregular, no murmurs, rubs, gallops RESPIRATORY:  Clear to auscultation without rales, wheezing or rhonchi  ABDOMEN: Soft, non-tender, non-distended EXTREMITIES:  No edema; No deformity    ASSESSMENT AND PLAN:    Tachy-Brady syndrome s/p Medtronic PPM   Normal PPM function See Pace Art report No changes today  2.  Longstanding persistent atrial fibrillation: Isabel Hardy has presented today for surgery, with the diagnosis of AF.  The various methods of treatment have been discussed with the patient and family. After consideration of risks, benefits and other options for treatment, the patient has consented to  Procedure(s): Catheter ablation as a surgical intervention .  Risks include but not limited to complete heart block, stroke, esophageal damage, nerve damage, bleeding, vascular damage, tamponade, perforation, MI, and death. The patient's history has been reviewed, patient examined, no change in status, stable for surgery.  I have reviewed the patient's chart and labs.  Questions were answered to the patient's satisfaction.    Sulma Ruffino Norton, MD 02/03/2024 8:20 AM

## 2024-02-03 NOTE — Transfer of Care (Signed)
 Immediate Anesthesia Transfer of Care Note  Patient: Isabel Hardy  Procedure(s) Performed: ATRIAL FIBRILLATION ABLATION  Patient Location: PACU and Cath Lab  Anesthesia Type:General  Level of Consciousness: awake and alert   Airway & Oxygen Therapy: Patient Spontanous Breathing and Patient connected to nasal cannula oxygen  Post-op Assessment: Report given to RN and Post -op Vital signs reviewed and stable  Post vital signs: Reviewed and stable  Last Vitals:  Vitals Value Taken Time  BP 129/79 02/03/24 11:25  Temp    Pulse 60 02/03/24 11:26  Resp 16 02/03/24 11:26  SpO2 95 % 02/03/24 11:26  Vitals shown include unfiled device data.  Last Pain:  Vitals:   02/03/24 1120  TempSrc: (P) Axillary  PainSc:          Complications: There were no known notable events for this encounter.

## 2024-02-03 NOTE — Anesthesia Preprocedure Evaluation (Addendum)
 Anesthesia Evaluation  Patient identified by MRN, date of birth, ID band Patient awake    Reviewed: Allergy & Precautions, NPO status , Patient's Chart, lab work & pertinent test results  History of Anesthesia Complications Negative for: history of anesthetic complications  Airway Mallampati: III  TM Distance: >3 FB Neck ROM: Full    Dental  (+) Dental Advisory Given, Teeth Intact,    Pulmonary neg pulmonary ROS, neg sleep apnea, neg COPD, Patient abstained from smoking.Not current smoker   Pulmonary exam normal breath sounds clear to auscultation       Cardiovascular Exercise Tolerance: Good METShypertension, Pt. on home beta blockers and Pt. on medications (-) angina +CHF  (-) CAD, (-) Past MI, (-) Cardiac Stents and (-) CABG + dysrhythmias Atrial Fibrillation + pacemaker + Valvular Problems/Murmurs (h/o MV repair)  Rhythm:Regular Rate:Normal  HLD  TTE 07/25/2021: IMPRESSIONS    1. Left ventricular ejection fraction, by estimation, is 50 to 55%. The  left ventricle has low normal function. The left ventricle has no regional  wall motion abnormalities. Left ventricular diastolic parameters are  consistent with Grade II diastolic  dysfunction (pseudonormalization).   2. Right ventricular systolic function is normal. The right ventricular  size is normal. There is mildly elevated pulmonary artery systolic  pressure.   3. Left atrial size was severely dilated.   4. Mitral Valve Repair with 30 mm Physio ring annuloplasty and leaflet  repair, Cox-MAZE procedure with RF ablation and Cryo Left and Right sided  lesion sets, ligation of the atrial appendage, left atrial repair and  reconstruction with bovine  pericardium.. The mitral valve has been repaired/replaced. Mild mitral  valve regurgitation. The mean mitral valve gradient is 3.0 mmHg. Procedure  Date: 2017.   5. The aortic valve is normal in structure. Aortic valve  regurgitation is  not visualized. No aortic stenosis is present.   6. The inferior vena cava is normal in size with greater than 50%  respiratory variability, suggesting right atrial pressure of 3 mmHg.     Neuro/Psych neg Seizures  Neuromuscular disease (lumbar spinal stenosis) CVA (2006), No Residual Symptoms    GI/Hepatic negative GI ROS, Neg liver ROS,neg GERD  ,,  Endo/Other  negative endocrine ROSneg diabetes    Renal/GU Renal diseasenegative Renal ROS     Musculoskeletal   Abdominal  (+) + obese  Peds  Hematology negative hematology ROS (+) Lab Results      Component                Value               Date                      WBC                      5.2                 12/04/2023                HGB                      13.4                12/04/2023                HCT  40.3                12/04/2023                MCV                      96                  12/04/2023                PLT                      202                 12/04/2023              Anesthesia Other Findings Past Medical History: No date: Acute renal failure 04/02/2020: Atherosclerosis of aorta 04/02/2020: ATN (acute tubular necrosis) No date: Atrial fibrillation (HCC) 10/03/2015: Atrial flutter (HCC) 04/02/2020: Atrophic kidney, Right     Comment:  04/01/20 Renal US  04/01/2020: COVID-19 virus infection 11/23/2014: Dyslipidemia No date: Dysrhythmia 11/23/2014: Essential hypertension No date: H/O mitral valve repair 04/02/2020: Hematuria 12/31/2018: Hip pain 10/30/2017: Hx of Grade III diastolic dysfunction     Comment:  TTE 10/30/2017 No date: Hypertension 04/02/2020: Hypovolemia associated with vomiting 04/02/2020: Hypovolemic Hypotension 01/08/2021: Obesity (BMI 35.0-39.9 without comorbidity) 10/03/2015: Paroxysmal A-fib (HCC)     Comment:  Added automatically from request for surgery [id number]                Formatting of this note might be different from the                original. Added automatically from request for surgery               [id number] 04/02/2020: Pneumonia due to COVID-19 virus No date: Presence of permanent cardiac pacemaker 04/02/2020: Pyuria 10/03/2015: S/P MVR (mitral valve repair) 09/24/2016: Sinus node dysfunction (HCC) 04/02/2020: Somnolence 01/19/2019: Spinal stenosis of lumbar region with neurogenic  claudication No date: Stroke [facility])     Comment:  per patient, mini stroke in 2005/06  Reproductive/Obstetrics                              Anesthesia Physical Anesthesia Plan  ASA: 3  Anesthesia Plan: General   Post-op Pain Management: Minimal or no pain anticipated   Induction: Intravenous  PONV Risk Score and Plan: 3 and Ondansetron , Dexamethasone  and Treatment may vary due to age or medical condition  Airway Management Planned: Oral ETT  Additional Equipment: None  Intra-op Plan:   Post-operative Plan: Extubation in OR  Informed Consent: I have reviewed the patients History and Physical, chart, labs and discussed the procedure including the risks, benefits and alternatives for the proposed anesthesia with the patient or authorized representative who has indicated his/her understanding and acceptance.     Dental advisory given  Plan Discussed with: CRNA and Anesthesiologist  Anesthesia Plan Comments: (Risks of general anesthesia discussed including, but not limited to, sore throat, hoarse voice, chipped/damaged teeth, injury to vocal cords, nausea and vomiting, allergic reactions, lung infection, heart attack, stroke, and death. All questions answered. )         Anesthesia Quick Evaluation

## 2024-02-03 NOTE — Anesthesia Procedure Notes (Signed)
 Procedure Name: Intubation Date/Time: 02/03/2024 9:08 AM  Performed by: Boyce Shilling, CRNAPre-anesthesia Checklist: Patient identified, Emergency Drugs available, Suction available, Timeout performed and Patient being monitored Patient Re-evaluated:Patient Re-evaluated prior to induction Oxygen Delivery Method: Circle system utilized Preoxygenation: Pre-oxygenation with 100% oxygen Induction Type: IV induction Ventilation: Mask ventilation without difficulty Laryngoscope Size: Mac and 3 Grade View: Grade I Tube type: Oral Tube size: 6.5 mm Number of attempts: 1 Airway Equipment and Method: Stylet Placement Confirmation: ETT inserted through vocal cords under direct vision, positive ETCO2, CO2 detector and breath sounds checked- equal and bilateral Secured at: 22 cm Tube secured with: Tape Dental Injury: Teeth and Oropharynx as per pre-operative assessment

## 2024-02-04 ENCOUNTER — Encounter (HOSPITAL_COMMUNITY): Payer: Self-pay | Admitting: Cardiology

## 2024-02-04 ENCOUNTER — Telehealth (HOSPITAL_COMMUNITY): Payer: Self-pay

## 2024-02-04 MED FILL — Fentanyl Citrate Preservative Free (PF) Inj 100 MCG/2ML: INTRAMUSCULAR | Qty: 2 | Status: AC

## 2024-02-04 MED FILL — Atropine Sulfate Soln Prefill Syr 1 MG/10ML (0.1 MG/ML): INTRAMUSCULAR | Qty: 10 | Status: AC

## 2024-02-04 NOTE — Telephone Encounter (Signed)
 Spoke with patient to complete post procedure follow up call.  Patient reports no complications with groin sites.   Instructions reviewed with patient:  Remove large bandage at puncture site after 24 hours. It is normal to have bruising, tenderness, mild swelling, and a pea or marble sized lump/knot at the groin site which can take up to three months to resolve.  Get help right away if you notice sudden swelling at the puncture site.  Check your puncture site every day for signs of infection: fever, redness, swelling, pus drainage, warmth, foul odor or excessive pain. If this occurs, please call [phone], to speak with the RN Navigator. Get help right away if your puncture site is bleeding and the bleeding does not stop after applying firm pressure to the area.  You may continue to have skipped beats/ atrial fibrillation during the first several months after your procedure.  It is very important not to miss any doses of your blood thinner Eliquis .    You will follow up with the Afib clinic 4 weeks after your procedure and follow up with the Afib clinic 3 months after your procedure.   Patient verbalized understanding to all instructions provided.

## 2024-02-05 ENCOUNTER — Encounter: Payer: Self-pay | Admitting: Emergency Medicine

## 2024-02-10 ENCOUNTER — Telehealth: Payer: Self-pay

## 2024-02-10 NOTE — Telephone Encounter (Signed)
 Alert received:  Unscheduled remote transmission: Scheduled non-billable, recheck AF burden, ongoing AF, controlled rates, Eliquis  per EPIC  Outreach made to Pt.  Pt scheduled to see Afib clinic this Friday 02/12/2024 to evaluate continued AF post ablation.

## 2024-02-11 ENCOUNTER — Ambulatory Visit

## 2024-02-12 ENCOUNTER — Ambulatory Visit (HOSPITAL_COMMUNITY)
Admission: RE | Admit: 2024-02-12 | Discharge: 2024-02-12 | Disposition: A | Discharge status: Home / Self Care | Source: Ambulatory Visit | Attending: Physician Assistant | Admitting: Physician Assistant

## 2024-02-12 VITALS — BP 140/80 | HR 67 | Ht 60.0 in | Wt 193.6 lb

## 2024-02-12 DIAGNOSIS — I4811 Longstanding persistent atrial fibrillation: Secondary | ICD-10-CM | POA: Diagnosis not present

## 2024-02-12 DIAGNOSIS — I4819 Other persistent atrial fibrillation: Secondary | ICD-10-CM

## 2024-02-12 DIAGNOSIS — I5031 Acute diastolic (congestive) heart failure: Secondary | ICD-10-CM | POA: Diagnosis not present

## 2024-02-12 DIAGNOSIS — Z5181 Encounter for therapeutic drug level monitoring: Secondary | ICD-10-CM

## 2024-02-12 DIAGNOSIS — I4891 Unspecified atrial fibrillation: Secondary | ICD-10-CM

## 2024-02-12 DIAGNOSIS — Z79899 Other long term (current) drug therapy: Secondary | ICD-10-CM

## 2024-02-12 DIAGNOSIS — D6869 Other thrombophilia: Secondary | ICD-10-CM

## 2024-02-12 LAB — CBC
Hematocrit: 39.5 % (ref 34.0–46.6)
Hemoglobin: 13 g/dL (ref 11.1–15.9)
MCH: 29.7 pg (ref 26.6–33.0)
MCHC: 32.9 g/dL (ref 31.5–35.7)
MCV: 90 fL (ref 79–97)
Platelets: 215 x10E3/uL (ref 150–450)
RBC: 4.38 x10E6/uL (ref 3.77–5.28)
RDW: 14.9 % (ref 11.7–15.4)
WBC: 6.5 x10E3/uL (ref 3.4–10.8)

## 2024-02-12 LAB — BASIC METABOLIC PANEL WITH GFR
BUN/Creatinine Ratio: 11 — ABNORMAL LOW (ref 12–28)
BUN: 22 mg/dL (ref 8–27)
CO2: 26 mmol/L (ref 20–29)
Calcium: 8.6 mg/dL — ABNORMAL LOW (ref 8.7–10.3)
Chloride: 112 mmol/L — ABNORMAL HIGH (ref 96–106)
Creatinine, Ser: 1.93 mg/dL — ABNORMAL HIGH (ref 0.57–1.00)
Glucose: 108 mg/dL — ABNORMAL HIGH (ref 70–99)
Potassium: 4.9 mmol/L (ref 3.5–5.2)
Sodium: 146 mmol/L — ABNORMAL HIGH (ref 134–144)
eGFR: 27 mL/min/1.73 — ABNORMAL LOW (ref >59)

## 2024-02-12 MED ORDER — POTASSIUM CHLORIDE ER 20 MEQ PO TBCR: 1.0000 | EXTENDED_RELEASE_TABLET | Freq: Every day | ORAL | 1 refills | Status: DC | PRN

## 2024-02-12 MED ORDER — FUROSEMIDE 20 MG PO TABS: 20.0000 mg | ORAL_TABLET | Freq: Every day | ORAL | 1 refills | Status: DC | PRN

## 2024-02-12 NOTE — Progress Notes (Signed)
 Primary Care Physician: Physicians, [facility] Primary Cardiologist: None Electrophysiologist: Will Gladis Norton, MD  Referring Physician: Dr. Norton Isabel Hardy is a 73 y.o. female with a history of HTN, HLD, aortic atherosclerosis by CT imaging, mitral valve repair with surgical maze and left atrial appendage clipping, tachybrady syndrome s/p Medtronic dual-chamber pacemaker in 2019, atrial flutter, and persistent atrial fibrillation who presents for follow up in the [facility]. She was previously on Multaq . Device interrogation by Dr. Norton showed Afib with poor ventricular response and patient started on diltiazem  180 mg daily. Patient is on Eliquis  for stroke prevention.   On evaluation today, she is currently in Afib. She notes overall hesitation to proceed with AAD therapy. She has been compliant with Eliquis . She notes to really not feel bad when in Afib and also wonders what would happen if she did nothing to treat her Afib.   On follow up 09/16/23, she is currently in Afib. Recent device interrogation showed persistent Afib with controlled rates. She has not missed any doses of Eliquis .  Follow up 8/15. Patient returns for follow up for atrial fibrillation and amiodarone  monitoring. She went back into afib on 11/20/23 after a DCCV on 7/15. There were no specific triggers that she could identify. She did feel more energetic and less SOB when in SR. She is scheduled for an ablation with Dr Norton in October. She is also having increased lower extremity edema.   On follow up 12/22/23, patient is currently in V paced rhythm. S/p successful DCCV on 8/19. No missed doses of amiodarone  or Eliquis .   Follow up 02/12/24. Patient returns for follow up for atrial fibrillation. Patient is s/p afib ablation 02/03/24. She remains in persistent afib. She has symptoms of SOB, orthopnea, lower extremity edema today. Of note, she is also being treated for a  UTI. No bleeding issues on anticoagulation.   Today, she  denies symptoms of palpitations, chest pain, PND, dizziness, presyncope, syncope, snoring, daytime somnolence, bleeding, or neurologic sequela. The patient is tolerating medications without difficulties and is otherwise without complaint today.    she has a BMI of Body mass index is 37.81 kg/m.SABRA Filed Weights   02/12/24 0822  Weight: 87.8 kg    Current Outpatient Medications  Medication Sig Dispense Refill   acetaminophen  (TYLENOL ) 650 MG CR tablet Take 650 mg by mouth every 8 (eight) hours as needed for pain. (Patient taking differently: Take 650 mg by mouth as needed for pain.)     ALPRAZolam (XANAX XR) 0.5 MG 24 hr tablet Take 0.5 mg by mouth daily as needed for anxiety.     amiodarone  (PACERONE ) 200 MG tablet Take 1 tablet (200 mg total) by mouth daily. 90 tablet 2   amLODipine (NORVASC) 10 MG tablet Take 10 mg by mouth daily.     amoxicillin  (AMOXIL ) 500 MG tablet Take 2 grams (4 tablets) by mouth once 30-60 minutes prior to dental procedure 4 tablet 0   amoxicillin  (AMOXIL ) 875 MG tablet Take 875 mg by mouth 2 (two) times daily.     apixaban  (ELIQUIS ) 5 MG TABS tablet Take 5 mg by mouth 2 (two) times a day.      Ascorbic Acid (VITAMIN C) 1000 MG tablet Take 1,000 mg by mouth daily.     atorvastatin  (LIPITOR) 40 MG tablet Take 40 mg by mouth every evening.      Calcium  Carbonate-Vit D-Min (CALCIUM  1200 PO) Take 1,200 mg  by mouth daily.     cholecalciferol (VITAMIN D3) 25 MCG (1000 UNIT) tablet Take 1,000 Units by mouth daily.     dapagliflozin propanediol (FARXIGA) 10 MG TABS tablet Take 10 mg by mouth daily.     diltiazem  (CARDIZEM  CD) 180 MG 24 hr capsule Take 1 capsule (180 mg total) by mouth daily. 90 capsule 2   furosemide  (LASIX ) 20 MG tablet Take 20 mg by mouth daily as needed for fluid or edema.     lidocaine  4 % Place 1 patch onto the skin daily as needed (Pain).     metoprolol  tartrate (LOPRESSOR ) 100 MG tablet TAKE  1 TABLET BY MOUTH TWICE DAILY 180 tablet 2   Potassium Chloride  ER 20 MEQ TBCR Take 1 tablet by mouth daily.     PROLIA 60 MG/ML SOSY injection Inject 60 mg into the skin every 6 (six) months.     telmisartan  (MICARDIS ) 80 MG tablet Take 80 mg by mouth daily.     No current facility-administered medications for this encounter.    Atrial Fibrillation Management history:  Previous antiarrhythmic drugs: Multaq , amiodarone  Previous cardioversions: 07/09/22, 11/03/23, 12/08/23 Previous ablations: 02/03/24 Anticoagulation history: Eliquis    ROS- All systems are reviewed and negative except as per the HPI above.  Physical Exam: BP (!) 140/80   Pulse 67   Ht 5' (1.524 m)   Wt 87.8 kg   BMI 37.81 kg/m    GEN: Well nourished, well developed in no acute distress NECK: No JVD CARDIAC: Irregularly irregular rate and rhythm, no murmurs, rubs, gallops RESPIRATORY:  Clear to auscultation without rales, wheezing or rhonchi  ABDOMEN: Soft, non-tender, non-distended EXTREMITIES:  2+ bilateral lower extremity edema, No deformity    EKG today demonstrates  Afib with intermittent V pacing Vent. rate 67 BPM PR interval * ms QRS duration 86 ms QT/QTcB 452/477 ms   Echo 07/24/21 demonstrated   1. Left ventricular ejection fraction, by estimation, is 50 to 55%. The  left ventricle has low normal function. The left ventricle has no regional  wall motion abnormalities. Left ventricular diastolic parameters are  consistent with Grade II diastolic dysfunction (pseudonormalization).   2. Right ventricular systolic function is normal. The right ventricular  size is normal. There is mildly elevated pulmonary artery systolic  pressure.   3. Left atrial size was severely dilated.   4. Mitral Valve Repair with 30 mm Physio ring annuloplasty and leaflet  repair, Cox-MAZE procedure with RF ablation and Cryo Left and Right sided  lesion sets, ligation of the atrial appendage, left atrial repair and   reconstruction with bovine  pericardium.. The mitral valve has been repaired/replaced. Mild mitral  valve regurgitation. The mean mitral valve gradient is 3.0 mmHg. Procedure  Date: 2017.   5. The aortic valve is normal in structure. Aortic valve regurgitation is  not visualized. No aortic stenosis is present.   6. The inferior vena cava is normal in size with greater than 50%  respiratory variability, suggesting right atrial pressure of 3 mmHg.    CHA2DS2-VASc Score = 4  The patient's score is based upon: CHF History: 0 HTN History: 1 Diabetes History: 0 Stroke History: 0 Vascular Disease History: 1 Age Score: 1 Gender Score: 1       ASSESSMENT AND PLAN: Longstanding Persistent Atrial Fibrillation/atrial flutter (ICD10:  I48.11) The patient's CHA2DS2-VASc score is 4, indicating a 4.8% annual risk of stroke.   S/p MAZE at [facility] Patient is s/p afib and flutter ablation 02/03/24 Patient back  in persistent afib.  Will increase amiodarone  to 200 mg BID and plan for DCCV.  Continue Eliquis  5 mg BID with no missed doses for 3 months post ablation.  Continue diltiazem  180 mg daily  Secondary Hypercoagulable State (ICD10:  D68.69) The patient is at significant risk for stroke/thromboembolism based upon her CHA2DS2-VASc Score of 4.  Continue Apixaban  (Eliquis ). No bleeding issues.   High Risk Medication Monitoring (ICD 10: J342684) Patient requires ongoing monitoring for anti-arrhythmic medication which has the potential to cause life threatening arrhythmias. Intervals on ECG acceptable for amiodarone  monitoring.   HTN Stable on current regimen  Acute HFpEF EF 50-55% Appears fluid overloaded today with 2+ edema, ~20 lbs weight gain over last several weeks, symptoms of orthopnea.  Will check bmet/cbc/BNP today. Will increase her Lasix  to 20 mg BID x 7 days. Will need to watch renal function with one kidney. Also increase KCL to 20 meq BID with Lasix .  Reduce back to PRN after 7  days.  She has close follow up with her nephrologist Dr Tobie.    Follow up in the AF clinic as scheduled.    Informed Consent   Shared Decision Making/Informed Consent The risks (stroke, cardiac arrhythmias rarely resulting in the need for a temporary or permanent pacemaker, skin irritation or burns and complications associated with conscious sedation including aspiration, arrhythmia, respiratory failure and death), benefits (restoration of normal sinus rhythm) and alternatives of a direct current cardioversion were explained in detail to Ms. Daman and she agrees to proceed.       Daril Kicks PA-C Afib Clinic [facility] [address] [place], [place] [address] [phone]

## 2024-02-12 NOTE — H&P (View-Only) (Signed)
 Primary Care Physician: Physicians, [facility] Primary Cardiologist: None Electrophysiologist: Will Gladis Norton, MD  Referring Physician: Dr. Norton [name] is a 73 y.o. female with a history of HTN, HLD, aortic atherosclerosis by CT imaging, mitral valve repair with surgical maze and left atrial appendage clipping, tachybrady syndrome s/p Medtronic dual-chamber pacemaker in 2019, atrial flutter, and persistent atrial fibrillation who presents for follow up in the [facility]. She was previously on Multaq . Device interrogation by Dr. Norton showed Afib with poor ventricular response and patient started on diltiazem  180 mg daily. Patient is on Eliquis  for stroke prevention.   On evaluation today, she is currently in Afib. She notes overall hesitation to proceed with AAD therapy. She has been compliant with Eliquis . She notes to really not feel bad when in Afib and also wonders what would happen if she did nothing to treat her Afib.   On follow up 09/16/23, she is currently in Afib. Recent device interrogation showed persistent Afib with controlled rates. She has not missed any doses of Eliquis .  Follow up 8/15. Patient returns for follow up for atrial fibrillation and amiodarone  monitoring. She went back into afib on 11/20/23 after a DCCV on 7/15. There were no specific triggers that she could identify. She did feel more energetic and less SOB when in SR. She is scheduled for an ablation with Dr Norton in October. She is also having increased lower extremity edema.   On follow up 12/22/23, patient is currently in V paced rhythm. S/p successful DCCV on 8/19. No missed doses of amiodarone  or Eliquis .   Follow up 02/12/24. Patient returns for follow up for atrial fibrillation. Patient is s/p afib ablation 02/03/24. She remains in persistent afib. She has symptoms of SOB, orthopnea, lower extremity edema today. Of note, she is also being treated for a  UTI. No bleeding issues on anticoagulation.   Today, she  denies symptoms of palpitations, chest pain, PND, dizziness, presyncope, syncope, snoring, daytime somnolence, bleeding, or neurologic sequela. The patient is tolerating medications without difficulties and is otherwise without complaint today.    she has a BMI of Body mass index is 37.81 kg/m.SABRA Filed Weights   02/12/24 0822  Weight: 87.8 kg    Current Outpatient Medications  Medication Sig Dispense Refill   acetaminophen  (TYLENOL ) 650 MG CR tablet Take 650 mg by mouth every 8 (eight) hours as needed for pain. (Patient taking differently: Take 650 mg by mouth as needed for pain.)     ALPRAZolam (XANAX XR) 0.5 MG 24 hr tablet Take 0.5 mg by mouth daily as needed for anxiety.     amiodarone  (PACERONE ) 200 MG tablet Take 1 tablet (200 mg total) by mouth daily. 90 tablet 2   amLODipine (NORVASC) 10 MG tablet Take 10 mg by mouth daily.     amoxicillin  (AMOXIL ) 500 MG tablet Take 2 grams (4 tablets) by mouth once 30-60 minutes prior to dental procedure 4 tablet 0   amoxicillin  (AMOXIL ) 875 MG tablet Take 875 mg by mouth 2 (two) times daily.     apixaban  (ELIQUIS ) 5 MG TABS tablet Take 5 mg by mouth 2 (two) times a day.      Ascorbic Acid (VITAMIN C) 1000 MG tablet Take 1,000 mg by mouth daily.     atorvastatin  (LIPITOR) 40 MG tablet Take 40 mg by mouth every evening.      Calcium  Carbonate-Vit D-Min (CALCIUM  1200 PO) Take 1,200 mg  by mouth daily.     cholecalciferol (VITAMIN D3) 25 MCG (1000 UNIT) tablet Take 1,000 Units by mouth daily.     dapagliflozin propanediol (FARXIGA) 10 MG TABS tablet Take 10 mg by mouth daily.     diltiazem  (CARDIZEM  CD) 180 MG 24 hr capsule Take 1 capsule (180 mg total) by mouth daily. 90 capsule 2   furosemide  (LASIX ) 20 MG tablet Take 20 mg by mouth daily as needed for fluid or edema.     lidocaine  4 % Place 1 patch onto the skin daily as needed (Pain).     metoprolol  tartrate (LOPRESSOR ) 100 MG tablet TAKE  1 TABLET BY MOUTH TWICE DAILY 180 tablet 2   Potassium Chloride  ER 20 MEQ TBCR Take 1 tablet by mouth daily.     PROLIA 60 MG/ML SOSY injection Inject 60 mg into the skin every 6 (six) months.     telmisartan  (MICARDIS ) 80 MG tablet Take 80 mg by mouth daily.     No current facility-administered medications for this encounter.    Atrial Fibrillation Management history:  Previous antiarrhythmic drugs: Multaq , amiodarone  Previous cardioversions: 07/09/22, 11/03/23, 12/08/23 Previous ablations: 02/03/24 Anticoagulation history: Eliquis    ROS- All systems are reviewed and negative except as per the HPI above.  Physical Exam: BP (!) 140/80   Pulse 67   Ht 5' (1.524 m)   Wt 87.8 kg   BMI 37.81 kg/m    GEN: Well nourished, well developed in no acute distress NECK: No JVD CARDIAC: Irregularly irregular rate and rhythm, no murmurs, rubs, gallops RESPIRATORY:  Clear to auscultation without rales, wheezing or rhonchi  ABDOMEN: Soft, non-tender, non-distended EXTREMITIES:  2+ bilateral lower extremity edema, No deformity    EKG today demonstrates  Afib with intermittent V pacing Vent. rate 67 BPM PR interval * ms QRS duration 86 ms QT/QTcB 452/477 ms   Echo 07/24/21 demonstrated   1. Left ventricular ejection fraction, by estimation, is 50 to 55%. The  left ventricle has low normal function. The left ventricle has no regional  wall motion abnormalities. Left ventricular diastolic parameters are  consistent with Grade II diastolic dysfunction (pseudonormalization).   2. Right ventricular systolic function is normal. The right ventricular  size is normal. There is mildly elevated pulmonary artery systolic  pressure.   3. Left atrial size was severely dilated.   4. Mitral Valve Repair with 30 mm Physio ring annuloplasty and leaflet  repair, Cox-MAZE procedure with RF ablation and Cryo Left and Right sided  lesion sets, ligation of the atrial appendage, left atrial repair and   reconstruction with bovine  pericardium.. The mitral valve has been repaired/replaced. Mild mitral  valve regurgitation. The mean mitral valve gradient is 3.0 mmHg. Procedure  Date: 2017.   5. The aortic valve is normal in structure. Aortic valve regurgitation is  not visualized. No aortic stenosis is present.   6. The inferior vena cava is normal in size with greater than 50%  respiratory variability, suggesting right atrial pressure of 3 mmHg.    CHA2DS2-VASc Score = 4  The patient's score is based upon: CHF History: 0 HTN History: 1 Diabetes History: 0 Stroke History: 0 Vascular Disease History: 1 Age Score: 1 Gender Score: 1       ASSESSMENT AND PLAN: Longstanding Persistent Atrial Fibrillation/atrial flutter (ICD10:  I48.11) The patient's CHA2DS2-VASc score is 4, indicating a 4.8% annual risk of stroke.   S/p MAZE at [facility] Patient is s/p afib and flutter ablation 02/03/24 Patient back  in persistent afib.  Will increase amiodarone  to 200 mg BID and plan for DCCV.  Continue Eliquis  5 mg BID with no missed doses for 3 months post ablation.  Continue diltiazem  180 mg daily  Secondary Hypercoagulable State (ICD10:  D68.69) The patient is at significant risk for stroke/thromboembolism based upon her CHA2DS2-VASc Score of 4.  Continue Apixaban  (Eliquis ). No bleeding issues.   High Risk Medication Monitoring (ICD 10: J342684) Patient requires ongoing monitoring for anti-arrhythmic medication which has the potential to cause life threatening arrhythmias. Intervals on ECG acceptable for amiodarone  monitoring.   HTN Stable on current regimen  Acute HFpEF EF 50-55% Appears fluid overloaded today with 2+ edema, ~20 lbs weight gain over last several weeks, symptoms of orthopnea.  Will check bmet/cbc/BNP today. Will increase her Lasix  to 20 mg BID x 7 days. Will need to watch renal function with one kidney. Also increase KCL to 20 meq BID with Lasix .  Reduce back to PRN after 7  days.  She has close follow up with her nephrologist Dr Tobie.    Follow up in the AF clinic as scheduled.    Informed Consent   Shared Decision Making/Informed Consent The risks (stroke, cardiac arrhythmias rarely resulting in the need for a temporary or permanent pacemaker, skin irritation or burns and complications associated with conscious sedation including aspiration, arrhythmia, respiratory failure and death), benefits (restoration of normal sinus rhythm) and alternatives of a direct current cardioversion were explained in detail to Ms. Daman and she agrees to proceed.       Daril Kicks PA-C Afib Clinic [facility] [address] [place], [place] [address] [phone]

## 2024-02-12 NOTE — Patient Instructions (Addendum)
 Increase Amiodarone  to 200mg  twice a day until day of cardioversion (or until you back in normal rhythm)   For the next 7 days take lasix  20mg  twice a day then back to only as needed for weight gain.   For the next 7 days take potassium twice a day then back to only as needed with lasix     Hold below medications 72 hours prior to scheduled procedure/anesthesia. Restart medication on the following day after scheduled procedure/anesthesia  Dapagliflozin Pauletta) -- hold as of October 28th until after procedure.  Cardioversion scheduled for: Friday, October 31st   - Arrive at the [facility] ([address]  and check in with ADMITTING at 9:30 AM   - Do not eat or drink anything after midnight the night prior to your procedure.   - Take all your morning medication (except diabetic medications) with a sip of water prior to arrival.  - Do NOT miss any doses of your blood thinner - if you should miss a dose or take a dose more than 4 hours late -- please notify our office immediately.  - You will not be able to drive home after your procedure. Please ensure you have a responsible adult to drive you home. You will need someone with you for 24 hours post procedure.     - Expect to be in the procedural area approximately 2 hours.   - If you feel as if you go back into normal rhythm prior to scheduled cardioversion, please notify our office immediately.   If your procedure is canceled in the cardioversion suite you will be charged a cancellation fee.

## 2024-02-15 LAB — BRAIN NATRIURETIC PEPTIDE: BNP: 437.7 pg/mL — ABNORMAL HIGH (ref 0.0–100.0)

## 2024-02-16 ENCOUNTER — Ambulatory Visit (INDEPENDENT_AMBULATORY_CARE_PROVIDER_SITE_OTHER): Payer: Medicare PPO

## 2024-02-16 ENCOUNTER — Ambulatory Visit (HOSPITAL_COMMUNITY): Payer: Self-pay | Admitting: Physician Assistant

## 2024-02-16 DIAGNOSIS — I4819 Other persistent atrial fibrillation: Secondary | ICD-10-CM | POA: Diagnosis not present

## 2024-02-17 LAB — CUP PACEART REMOTE DEVICE CHECK
Battery Remaining Longevity: 50 mo
Battery Voltage: 2.96 V
Brady Statistic RA Percent Paced: 0.4 %
Brady Statistic RV Percent Paced: 64.06 %
Date Time Interrogation Session: 20251027230022
Implantable Lead Connection Status: 753985
Implantable Lead Connection Status: 753985
Implantable Lead Implant Date: 20191127
Implantable Lead Implant Date: 20191127
Implantable Lead Location: 753859
Implantable Lead Location: 753860
Implantable Lead Model: 5076
Implantable Lead Model: 5076
Implantable Pulse Generator Implant Date: 20191127
Lead Channel Impedance Value: 285 Ohm
Lead Channel Impedance Value: 304 Ohm
Lead Channel Impedance Value: 342 Ohm
Lead Channel Impedance Value: 380 Ohm
Lead Channel Pacing Threshold Amplitude: 0.5 V
Lead Channel Pacing Threshold Amplitude: 0.75 V
Lead Channel Pacing Threshold Pulse Width: 0.4 ms
Lead Channel Pacing Threshold Pulse Width: 0.4 ms
Lead Channel Sensing Intrinsic Amplitude: 0.625 mV
Lead Channel Sensing Intrinsic Amplitude: 0.625 mV
Lead Channel Sensing Intrinsic Amplitude: 7.5 mV
Lead Channel Sensing Intrinsic Amplitude: 7.5 mV
Lead Channel Setting Pacing Amplitude: 1.5 V
Lead Channel Setting Pacing Amplitude: 1.5 V
Lead Channel Setting Pacing Pulse Width: 0.4 ms
Lead Channel Setting Sensing Sensitivity: 0.9 mV
Zone Setting Status: 755011
Zone Setting Status: 755011

## 2024-02-18 DIAGNOSIS — I129 Hypertensive chronic kidney disease with stage 1 through stage 4 chronic kidney disease, or unspecified chronic kidney disease: Secondary | ICD-10-CM | POA: Diagnosis not present

## 2024-02-18 DIAGNOSIS — D631 Anemia in chronic kidney disease: Secondary | ICD-10-CM | POA: Diagnosis not present

## 2024-02-18 DIAGNOSIS — N1832 Chronic kidney disease, stage 3b: Secondary | ICD-10-CM | POA: Diagnosis not present

## 2024-02-18 DIAGNOSIS — B961 Klebsiella pneumoniae [K. pneumoniae] as the cause of diseases classified elsewhere: Secondary | ICD-10-CM | POA: Diagnosis not present

## 2024-02-18 DIAGNOSIS — N2581 Secondary hyperparathyroidism of renal origin: Secondary | ICD-10-CM | POA: Diagnosis not present

## 2024-02-18 DIAGNOSIS — N3090 Cystitis, unspecified without hematuria: Secondary | ICD-10-CM | POA: Diagnosis not present

## 2024-02-18 NOTE — Anesthesia Preprocedure Evaluation (Signed)
 Anesthesia Evaluation  Patient identified by MRN, date of birth, ID band Patient awake    Reviewed: Allergy & Precautions, NPO status , Patient's Chart, lab work & pertinent test results  History of Anesthesia Complications Negative for: history of anesthetic complications  Airway Mallampati: II  TM Distance: >3 FB Neck ROM: Full    Dental no notable dental hx. (+) Teeth Intact,    Pulmonary neg pulmonary ROS   Pulmonary exam normal breath sounds clear to auscultation       Cardiovascular hypertension, Pt. on medications +CHF  Normal cardiovascular exam+ dysrhythmias (eliquis ) Atrial Fibrillation + pacemaker  Rhythm:Irregular Rate:Abnormal  [id number]  1. Left ventricular ejection fraction, by estimation, is 50 to 55%. The  left ventricle has low normal function. The left ventricle has no regional  wall motion abnormalities. Left ventricular diastolic parameters are  consistent with Grade II diastolic  dysfunction (pseudonormalization).   2. Right ventricular systolic function is normal. The right ventricular  size is normal. There is mildly elevated pulmonary artery systolic  pressure.   3. Left atrial size was severely dilated.   4. Mitral Valve Repair with 30 mm Physio ring annuloplasty and leaflet  repair, Cox-MAZE procedure with RF ablation and Cryo Left and Right sided  lesion sets, ligation of the atrial appendage, left atrial repair and  reconstruction with bovine  pericardium.. The mitral valve has been repaired/replaced. Mild mitral  valve regurgitation. The mean mitral valve gradient is 3.0 mmHg. Procedure  Date: 2017.   5. The aortic valve is normal in structure. Aortic valve regurgitation is  not visualized. No aortic stenosis is present.   6. The inferior vena cava is normal in size with greater than 50%  respiratory variability, suggesting right atrial pressure of 3 mmHg.     Neuro/Psych CVA, No Residual  Symptoms negative neurological ROS     GI/Hepatic   Endo/Other    Renal/GU Renal InsufficiencyRenal diseaseLab Results      Component                Value               Date                                 K                        4.9                 02/12/2024                         CREATININE               1.93 (H)            02/12/2024                     Musculoskeletal   Abdominal   Peds  Hematology Lab Results      Component                Value               Date                      WBC  6.5                 02/12/2024                HGB                      13.0                02/12/2024                HCT                      39.5                02/12/2024                MCV                      90                  02/12/2024                PLT                      215                 02/12/2024              Anesthesia Other Findings   Reproductive/Obstetrics                              Anesthesia Physical Anesthesia Plan  ASA: 3  Anesthesia Plan: General   Post-op Pain Management: Minimal or no pain anticipated   Induction: Intravenous  PONV Risk Score and Plan: Propofol  infusion and Treatment may vary due to age or medical condition  Airway Management Planned: Mask, Natural Airway and Nasal Cannula  Additional Equipment: None  Intra-op Plan:   Post-operative Plan:   Informed Consent: I have reviewed the patients History and Physical, chart, labs and discussed the procedure including the risks, benefits and alternatives for the proposed anesthesia with the patient or authorized representative who has indicated his/her understanding and acceptance.     Dental advisory given  Plan Discussed with: CRNA and Surgeon  Anesthesia Plan Comments:          Anesthesia Quick Evaluation

## 2024-02-19 ENCOUNTER — Encounter (HOSPITAL_COMMUNITY): Payer: Self-pay | Admitting: Anesthesiology

## 2024-02-19 ENCOUNTER — Ambulatory Visit (HOSPITAL_COMMUNITY)
Admission: RE | Admit: 2024-02-19 | Discharge: 2024-02-19 | Disposition: A | Discharge status: Home / Self Care | Attending: Cardiology | Admitting: Cardiology

## 2024-02-19 ENCOUNTER — Encounter (HOSPITAL_COMMUNITY): Payer: Self-pay | Admitting: Cardiology

## 2024-02-19 ENCOUNTER — Ambulatory Visit: Payer: Self-pay | Admitting: Cardiology

## 2024-02-19 ENCOUNTER — Ambulatory Visit (HOSPITAL_BASED_OUTPATIENT_CLINIC_OR_DEPARTMENT_OTHER): Payer: Self-pay | Admitting: Anesthesiology

## 2024-02-19 ENCOUNTER — Encounter (HOSPITAL_COMMUNITY): Admission: RE | Disposition: A | Payer: Self-pay | Source: Home / Self Care | Attending: Cardiology

## 2024-02-19 ENCOUNTER — Other Ambulatory Visit: Payer: Self-pay

## 2024-02-19 DIAGNOSIS — I509 Heart failure, unspecified: Secondary | ICD-10-CM

## 2024-02-19 DIAGNOSIS — I4892 Unspecified atrial flutter: Secondary | ICD-10-CM | POA: Diagnosis not present

## 2024-02-19 DIAGNOSIS — I700 Atherosclerosis of aorta: Secondary | ICD-10-CM | POA: Diagnosis not present

## 2024-02-19 DIAGNOSIS — Z79899 Other long term (current) drug therapy: Secondary | ICD-10-CM | POA: Insufficient documentation

## 2024-02-19 DIAGNOSIS — Z950 Presence of cardiac pacemaker: Secondary | ICD-10-CM | POA: Insufficient documentation

## 2024-02-19 DIAGNOSIS — D6869 Other thrombophilia: Secondary | ICD-10-CM | POA: Diagnosis not present

## 2024-02-19 DIAGNOSIS — I5031 Acute diastolic (congestive) heart failure: Secondary | ICD-10-CM | POA: Diagnosis not present

## 2024-02-19 DIAGNOSIS — I110 Hypertensive heart disease with heart failure: Secondary | ICD-10-CM | POA: Diagnosis not present

## 2024-02-19 DIAGNOSIS — I4819 Other persistent atrial fibrillation: Secondary | ICD-10-CM | POA: Insufficient documentation

## 2024-02-19 DIAGNOSIS — I495 Sick sinus syndrome: Secondary | ICD-10-CM | POA: Diagnosis not present

## 2024-02-19 DIAGNOSIS — Z7901 Long term (current) use of anticoagulants: Secondary | ICD-10-CM | POA: Insufficient documentation

## 2024-02-19 DIAGNOSIS — E785 Hyperlipidemia, unspecified: Secondary | ICD-10-CM | POA: Insufficient documentation

## 2024-02-19 DIAGNOSIS — I4891 Unspecified atrial fibrillation: Secondary | ICD-10-CM

## 2024-02-19 HISTORY — PX: CARDIOVERSION: EP1203

## 2024-02-19 SURGERY — CARDIOVERSION (CATH LAB)
Anesthesia: General

## 2024-02-19 MED ORDER — PROPOFOL 10 MG/ML IV BOLUS
INTRAVENOUS | Status: DC | PRN
  Administered 2024-02-19: 60 mg via INTRAVENOUS

## 2024-02-19 MED ORDER — LIDOCAINE 2% (20 MG/ML) 5 ML SYRINGE
INTRAMUSCULAR | Status: DC | PRN
  Administered 2024-02-19: 60 mg via INTRAVENOUS

## 2024-02-19 MED ORDER — SODIUM CHLORIDE 0.9 % IV SOLN: INTRAVENOUS | Status: DC

## 2024-02-19 MED ORDER — SODIUM CHLORIDE 0.9% FLUSH
INTRAVENOUS | Status: DC | PRN
Start: 2024-02-19 — End: 2024-02-19
  Administered 2024-02-19: 70 mL via INTRAVENOUS

## 2024-02-19 SURGICAL SUPPLY — 1 items: PAD DEFIB RADIO PHYSIO CONN (PAD) ×1 IMPLANT

## 2024-02-19 NOTE — CV Procedure (Signed)
 Procedure:   DCCV  Indication:  Symptomatic atrial fibrillation  Procedure Note:  The patient signed informed consent.  They have had had therapeutic anticoagulation with fibrillation greater than 3 weeks.  Anesthesia was administered by Dr. Jefm and Ariel Schmitz, CRNA.  Adequate airway was maintained throughout and vital followed per protocol.  They were cardioverted x 1 with 200J of biphasic synchronized energy.  They converted to Apaced, Vpaced rhythm with rate 80s. There were no apparent complications.  The patient had normal neuro status and respiratory status post procedure with vitals stable as recorded elsewhere.    Follow up:  They will continue on current medical therapy and follow up with cardiology as scheduled.  Lonni Nanas, MD 02/19/2024 9:35 AM

## 2024-02-19 NOTE — Transfer of Care (Signed)
 Immediate Anesthesia Transfer of Care Note  Patient: Isabel Hardy  Procedure(s) Performed: CARDIOVERSION  Patient Location: PACU  Anesthesia Type:MAC  Level of Consciousness: sedated  Airway & Oxygen Therapy: Patient connected to nasal cannula oxygen  Post-op Assessment: Report given to RN and Post -op Vital signs reviewed and stable  Post vital signs: Reviewed and stable  Last Vitals:  Vitals Value Taken Time  BP    Temp    Pulse    Resp    SpO2      Last Pain:  Vitals:   02/19/24 0914  TempSrc:   PainSc: 0-No pain         Complications: No notable events documented.

## 2024-02-19 NOTE — Anesthesia Postprocedure Evaluation (Signed)
 Anesthesia Post Note  Patient: Isabel Hardy  Procedure(s) Performed: CARDIOVERSION     Patient location during evaluation: Cath Lab Anesthesia Type: General Level of consciousness: awake and alert Pain management: pain level controlled Vital Signs Assessment: post-procedure vital signs reviewed and stable Respiratory status: spontaneous breathing, nonlabored ventilation, respiratory function stable and patient connected to nasal cannula oxygen Cardiovascular status: blood pressure returned to baseline and stable Postop Assessment: no apparent nausea or vomiting Anesthetic complications: no   No notable events documented.  Last Vitals:  Vitals:   02/19/24 0947 02/19/24 0957  BP: 117/74 122/75  Pulse: 60 61  Resp: 17 19  Temp:    SpO2: 95% 96%    Last Pain:  Vitals:   02/19/24 0957  TempSrc:   PainSc: 0-No pain                 Garnette DELENA Gab

## 2024-02-19 NOTE — Discharge Instructions (Signed)

## 2024-02-19 NOTE — Interval H&P Note (Signed)
 History and Physical Interval Note:  02/19/2024 9:00 AM  Isabel Hardy  has presented today for surgery, with the diagnosis of AFIB.  The various methods of treatment have been discussed with the patient and family. After consideration of risks, benefits and other options for treatment, the patient has consented to  Procedure(s): CARDIOVERSION (N/A) as a surgical intervention.  The patient's history has been reviewed, patient examined, no change in status, stable for surgery.  I have reviewed the patient's chart and labs.  Questions were answered to the patient's satisfaction.     Lonni LITTIE Nanas

## 2024-02-22 ENCOUNTER — Telehealth: Payer: Self-pay

## 2024-02-22 NOTE — Telephone Encounter (Signed)
 Pacemaker alert transmission: AF burden Alert remote transmission:  AT/AF Daily Burden > Threshold AF/AFL in progress from 10/31 @ 17:12, pt had successful DCCV 10/31  Appears returned to AF around 5:12 pm on 02/19/24.  Attempted to reach patient- LMOVM - to make aware and request updated transmission.  Also sending to AF clinic.

## 2024-02-23 NOTE — Progress Notes (Signed)
 Remote PPM Transmission

## 2024-02-24 NOTE — Telephone Encounter (Signed)
 Pt called back and would like a call back.

## 2024-02-24 NOTE — Telephone Encounter (Addendum)
 Called patient back to notify them they are back in an atrial arrhythmia   Patient asked if they were still in it now  This RN informed patient that if they wanted to send a manual transmission that we would be happy to look at it for them and tell them their current rhythm  Patient agreeable to this offer  Transmission received and reviewed  Presenting EGM c/w A-flutter with controlled VP rates  Patient stated they feel tired but denied any lightheadedness, dizziness, or feeling like passing out  Patient has post DCCV follow up appointment in Afib clinic on 03/02/24  Patient instructed to call device clinic if she becomes minimally symptomatic (resolve with rest) between now and their visit next week. Patient verbalized understanding.  ED precautions reviewed with patient incase they become severely symptomatic (not resolved with rest and quickly worsening) between now and their visit next week. Patient verbalized understanding.  All questions and concerns addressed during call  Patient very appreciative of call back

## 2024-03-01 DIAGNOSIS — I5042 Chronic combined systolic (congestive) and diastolic (congestive) heart failure: Secondary | ICD-10-CM | POA: Diagnosis not present

## 2024-03-01 DIAGNOSIS — M810 Age-related osteoporosis without current pathological fracture: Secondary | ICD-10-CM | POA: Diagnosis not present

## 2024-03-01 DIAGNOSIS — E785 Hyperlipidemia, unspecified: Secondary | ICD-10-CM | POA: Diagnosis not present

## 2024-03-01 DIAGNOSIS — N1832 Chronic kidney disease, stage 3b: Secondary | ICD-10-CM | POA: Diagnosis not present

## 2024-03-01 DIAGNOSIS — Z6835 Body mass index (BMI) 35.0-35.9, adult: Secondary | ICD-10-CM | POA: Diagnosis not present

## 2024-03-01 DIAGNOSIS — I4891 Unspecified atrial fibrillation: Secondary | ICD-10-CM | POA: Diagnosis not present

## 2024-03-01 DIAGNOSIS — R600 Localized edema: Secondary | ICD-10-CM | POA: Diagnosis not present

## 2024-03-02 ENCOUNTER — Ambulatory Visit (HOSPITAL_COMMUNITY)
Admission: RE | Admit: 2024-03-02 | Discharge: 2024-03-02 | Disposition: A | Discharge status: Home / Self Care | Attending: Internal Medicine | Admitting: Internal Medicine

## 2024-03-02 ENCOUNTER — Encounter (HOSPITAL_COMMUNITY): Payer: Self-pay | Admitting: Internal Medicine

## 2024-03-02 VITALS — BP 118/70 | HR 65 | Ht 60.0 in | Wt 188.4 lb

## 2024-03-02 DIAGNOSIS — D6869 Other thrombophilia: Secondary | ICD-10-CM

## 2024-03-02 DIAGNOSIS — Z79899 Other long term (current) drug therapy: Secondary | ICD-10-CM | POA: Diagnosis not present

## 2024-03-02 DIAGNOSIS — I4819 Other persistent atrial fibrillation: Secondary | ICD-10-CM

## 2024-03-02 DIAGNOSIS — Z5181 Encounter for therapeutic drug level monitoring: Secondary | ICD-10-CM

## 2024-03-02 MED ORDER — FUROSEMIDE 20 MG PO TABS: 20.0000 mg | ORAL_TABLET | Freq: Every day | ORAL | 1 refills | Status: DC

## 2024-03-02 MED ORDER — AMIODARONE HCL 200 MG PO TABS: 200.0000 mg | ORAL_TABLET | Freq: Two times a day (BID) | ORAL | 1 refills | Status: DC

## 2024-03-02 MED ORDER — POTASSIUM CHLORIDE ER 20 MEQ PO TBCR: 1.0000 | EXTENDED_RELEASE_TABLET | Freq: Every day | ORAL | 1 refills | Status: DC

## 2024-03-02 NOTE — Patient Instructions (Addendum)
 Take amiodarone  200 mg twice a day until Cardioversion then DECREASE to ONCE daily Take Lasix  20 mg twice daily  Take Potassium 20 meq once daily   Call us  to confirm home dose of amiodarone  you have been taking and to schedule procedure  Call your nephrologists to help with fluid retention   HOLD FARXIGA 3 DAYS before cardioversion   Cardioversion scheduled for: call us  to make appointment    - Arrive at the Main Entrance A of [facility] ([address]  and check in with ADMITTING at   - Do not eat or drink anything after midnight the night prior to your procedure.   - Take all your morning medication (except diabetic medications) with a sip of water prior to arrival.  - Do NOT miss any doses of your blood thinner - if you should miss a dose or take a dose more than 4 hours late -- please notify our office immediately.  - You will not be able to drive home after your procedure. Please ensure you have a responsible adult to drive you home. You will need someone with you for 24 hours post procedure.     - Expect to be in the procedural area approximately 2 hours.   - If you feel as if you go back into normal rhythm prior to scheduled cardioversion, please notify our office immediately.   If your procedure is canceled in the cardioversion suite you will be charged a cancellation fee.     Hold below medications 72 hours prior to scheduled procedure/anesthesia. Restart medication on the following day after scheduled procedure/anesthesia BDapagliflozin (Farxiga)    For those patients who have a scheduled procedure/anesthesia on the same day of the week as their dose, hold the medication on the day of surgery.  They can take their scheduled dose the week before.  **Patients on the above medications scheduled for elective procedures that have not held the medication for the appropriate amount of time are at risk of cancellation or change in the anesthetic  plan.

## 2024-03-02 NOTE — H&P (View-Only) (Signed)
Primary Care Physician: Isabel Dene BROCKS, DO Primary Cardiologist: None Electrophysiologist: Will Isabel Norton, MD  Referring Physician: Dr. Norton Isabel Hardy is a 73 y.o. 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 She was previously on Multaq . Device interrogation by Dr. Norton showed Afib with poor ventricular response and patient started on diltiazem  180 mg daily. Patient is on Eliquis  for stroke prevention.   On evaluation today, she is currently in Afib. She notes overall hesitation to proceed with AAD therapy. She has been compliant with Eliquis . She notes to really not feel bad when in Afib and also wonders what would happen if she did nothing to treat her Afib.   On follow up 09/16/23, she is currently in Afib. Recent device interrogation showed persistent Afib with controlled rates. She has not missed any doses of Eliquis .  Follow up 8/15. Patient returns for follow up for atrial fibrillation and amiodarone  monitoring. She went back into afib on 11/20/23 after a DCCV on 7/15. There were no specific triggers that she could identify. She did feel more energetic and less SOB when in SR. She is scheduled for an ablation with Dr Hardy in October. She is also having increased lower extremity edema.   On follow up 12/22/23, patient is currently in V paced rhythm. S/p successful DCCV on 8/19. No missed doses of amiodarone  or Eliquis .   Follow up 02/12/24. Patient returns for follow up for atrial fibrillation. Patient is s/p afib ablation 02/03/24. She remains in persistent afib. She has symptoms of SOB, orthopnea, lower extremity edema today. Of note, she is also being treated for a UTI.  No bleeding issues on anticoagulation.   On follow up 03/02/24, patient is currently in Afib. S/p successful DCCV on 10/31 but unfortunately patient had ERAF several hours after cardioversion. She is taking amiodarone  200 mg daily. No missed doses of Eliquis . Weight 87.8 kg on 10/24 compared to today's weight of 85.5. kg. She notes was seen by Nephrologist Dr. Tobie after cardioversion and received lasix  pump / injection which helped her lose about 6 lbs. She notes have SOB especially at night and fluid is back. She thinks she might still be taking amiodarone  200 mg BID.  She is currently taking Lasix  20 mg daily and potassium 20 mEq daily.  No missed doses of anticoagulant.  Today, she  denies symptoms of palpitations, chest pain, PND, dizziness, presyncope, syncope, snoring, daytime somnolence, bleeding, or neurologic sequela. The patient is tolerating medications without difficulties and is otherwise without complaint today.    she has a BMI of Body mass index is 36.79 kg/m.Isabel Hardy Filed Weights   03/02/24 1115  Weight: 85.5 kg     Current Outpatient Medications  Medication Sig Dispense Refill   acetaminophen  (TYLENOL ) 650 MG CR tablet Take 650 mg by mouth every 8 (eight) hours as needed for pain.     ALPRAZolam (XANAX XR) 0.5 MG 24 hr tablet Take 0.5 mg by mouth daily as needed for anxiety.     amiodarone  (PACERONE ) 200 MG tablet Take 1 tablet (200 mg total) by mouth daily. 90 tablet 2   amLODipine (NORVASC) 10 MG tablet Take 10 mg by mouth daily.     amoxicillin  (AMOXIL ) 500 MG  tablet Take 2 grams (4 tablets) by mouth once 30-60 minutes prior to dental procedure 4 tablet 0   amoxicillin  (AMOXIL ) 875 MG tablet Take 875 mg by mouth 2 (two) times daily.     apixaban  (ELIQUIS ) 5 MG TABS tablet Take 5 mg by mouth 2 (two) times a day.      Ascorbic Acid (VITAMIN C) 1000 MG tablet Take 1,000 mg by mouth daily.     atorvastatin  (LIPITOR) 40 MG tablet Take 40 mg by mouth every evening.      Calcium   Carbonate-Vit D-Min (CALCIUM  1200 PO) Take 1,200 mg by mouth daily.     cholecalciferol (VITAMIN D3) 25 MCG (1000 UNIT) tablet Take 1,000 Units by mouth daily.     dapagliflozin propanediol (FARXIGA) 10 MG TABS tablet Take 10 mg by mouth daily.     diltiazem  (CARDIZEM  CD) 180 MG 24 hr capsule Take 1 capsule (180 mg total) by mouth daily. 90 capsule 2   furosemide  (LASIX ) 20 MG tablet Take 1 tablet (20 mg total) by mouth daily as needed (swelling or weight gain). 90 tablet 1   lidocaine  4 % Place 1 patch onto the skin daily as needed (Pain).     metoprolol  tartrate (LOPRESSOR ) 100 MG tablet TAKE 1 TABLET BY MOUTH TWICE DAILY 180 tablet 2   Potassium Chloride  ER 20 MEQ TBCR Take 1 tablet (20 mEq total) by mouth daily as needed (when lasix  taken). 90 tablet 1   PROLIA 60 MG/ML SOSY injection Inject 60 mg into the skin every 6 (six) months.     telmisartan  (MICARDIS ) 80 MG tablet Take 80 mg by mouth daily.     No current facility-administered medications for this encounter.    Atrial Fibrillation Management history:  Previous antiarrhythmic drugs: Multaq , amiodarone  Previous cardioversions: 07/09/22, 11/03/23, 12/08/23, 02/19/24 Previous ablations: 02/03/24 Anticoagulation history: Eliquis    ROS- All systems are reviewed and negative except as per the HPI above.  Physical Exam: BP 118/70   Pulse 65   Ht 5' (1.524 m)   Wt 85.5 kg   BMI 36.79 kg/m   GEN- The patient is well appearing, alert and oriented x 3 today.   Neck - no JVD or carotid bruit noted Lungs- Crackles noted bilaterally at lower bases Heart- Irregular rate and rhythm, no murmurs, rubs or gallops, PMI not laterally displaced Extremities- 2+ edema bilateral LE Skin - no rash or ecchymosis noted   EKG today demonstrates  Vent. rate 65 BPM PR interval * ms QRS duration 160 ms QT/QTcB 508/528 ms P-R-T axes * -87 115 Ventricular-paced rhythm with occasional and consecutive supraventricular complexes Abnormal ECG When  compared with ECG of 19-Feb-2024 09:39, Previous ECG is present   Echo 07/24/21 demonstrated   1. Left ventricular ejection fraction, by estimation, is 50 to 55%. The  left ventricle has low normal function. The left ventricle has no regional  wall motion abnormalities. Left ventricular diastolic parameters are  consistent with Grade II diastolic dysfunction (pseudonormalization).   2. Right ventricular systolic function is normal. The right ventricular  size is normal. There is mildly elevated pulmonary artery systolic  pressure.   3. Left atrial size was severely dilated.   4. Mitral Valve Repair with 30 mm Physio ring annuloplasty and leaflet  repair, Cox-MAZE procedure with RF ablation and Cryo Left and Right sided  lesion sets, ligation of the atrial appendage, left atrial repair and  reconstruction with bovine  pericardium.. The mitral valve has been repaired/replaced. Mild mitral  valve regurgitation. The mean mitral valve gradient is 3.0 mmHg. Procedure  Date: 2017.   5. The aortic valve is normal in structure. Aortic valve regurgitation is  not visualized. No aortic stenosis is present.   6. The inferior vena cava is normal in size with greater than 50%  respiratory variability, suggesting right atrial pressure of 3 mmHg.    CHA2DS2-VASc Score = 4  The patient's score is based upon: CHF History: 0 HTN History: 1 Diabetes History: 0 Stroke History: 0 Vascular Disease History: 1 Age Score: 1 Gender Score: 1       ASSESSMENT AND PLAN: Longstanding Persistent Atrial Fibrillation/atrial flutter (ICD10:  I48.11) The patient's CHA2DS2-VASc score is 4, indicating a 4.8% annual risk of stroke.   S/p MAZE at [facility] Patient is s/p afib and flutter ablation 02/03/24. S/p successful DCCV on 02/19/24.   Patient is back in Afib despite recent DCCV.  I discussed case with the patient's EP and agrees with amiodarone  reload and repeat cardioversion in the blanking period.  ER  precautions given to patient regarding fluid overload and progressive shortness of breath.  Will increase Lasix  to 20 mg twice daily and have her continue potassium 20 mEq daily.  She believes she has been taking amiodarone  200 mg twice daily since last office visit on 10/24.  She will contact clinic later today to confirm.  We would then schedule her for cardioversion next week as she has essentially loaded on amiodarone  200 mg twice daily for approximately 4 weeks.  She will have to hold her Farxiga 3 days prior to cardioversion.  Patient became tearful after ER precautions were discussed due to ongoing shortness of breath.  Patient is understandably frustrated at being out of rhythm again after her most recent cardioversion. Bmet and CBC obtained in anticipation of repeat cardioversion and to recheck creatinine.   Addendum: Patient contacted clinic back later in the day noting she has been taking amiodarone  200 mg BID, lasix  20 mg BID, and potassium 20 meq BID since being seen on 10/24. I advised will not increase lasix  until labs are back to reassess creatinine (still pending).   Secondary Hypercoagulable State (ICD10:  D68.69) The patient is at significant risk for stroke/thromboembolism based upon her CHA2DS2-VASc Score of 4.  Continue Apixaban  (Eliquis ).  No missed doses.   High risk medication monitoring (ICD10: [id number]) Patient requires ongoing monitoring for anti-arrhythmic medication which has the potential to cause life threatening arrhythmias or AV block. Qtc stable. Continue amiodarone  200 mg BID and transition to once daily after cardioversion.    HTN Stable today.   Acute HFpEF EF 50-55% She appears fluid overloaded today with +2 edema and shortness of breath.  Will check be met and CBC in anticipation of repeat cardioversion.  As noted above, will continue Lasix  20 mg twice daily and potassium 20 mEq daily. Recommended patient to contact Dr. Anthony office for assistance with  fluid overload.  Informed Consent   Shared Decision Making/Informed Consent The risks (stroke, cardiac arrhythmias rarely resulting in the need for a temporary or permanent pacemaker, skin irritation or burns and complications associated with conscious sedation including aspiration, arrhythmia, respiratory failure and death), benefits (restoration of normal sinus rhythm) and alternatives of a direct current cardioversion were explained in detail to Ms. Wholey and she agrees to proceed.        Follow up 2 weeks after DCCV.    Dorn Heinrich, PA-C [facility] [place] [address] [place], [place]  [address] [phone]  

## 2024-03-02 NOTE — Addendum Note (Signed)
 Encounter addended by: Terra Fairy PARAS, PA-C on: 03/02/2024 2:50 PM  Actions taken: Clinical Note Signed

## 2024-03-02 NOTE — Progress Notes (Addendum)
Primary Care Physician: Conley Dene BROCKS, DO Primary Cardiologist: None Electrophysiologist: Will Gladis Norton, MD  Referring Physician: Dr. Norton Isabel Hardy is a 73 y.o. 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 She was previously on Multaq . Device interrogation by Dr. Norton showed Afib with poor ventricular response and patient started on diltiazem  180 mg daily. Patient is on Eliquis  for stroke prevention.   On evaluation today, she is currently in Afib. She notes overall hesitation to proceed with AAD therapy. She has been compliant with Eliquis . She notes to really not feel bad when in Afib and also wonders what would happen if she did nothing to treat her Afib.   On follow up 09/16/23, she is currently in Afib. Recent device interrogation showed persistent Afib with controlled rates. She has not missed any doses of Eliquis .  Follow up 8/15. Patient returns for follow up for atrial fibrillation and amiodarone  monitoring. She went back into afib on 11/20/23 after a DCCV on 7/15. There were no specific triggers that she could identify. She did feel more energetic and less SOB when in SR. She is scheduled for an ablation with Dr Norton in October. She is also having increased lower extremity edema.   On follow up 12/22/23, patient is currently in V paced rhythm. S/p successful DCCV on 8/19. No missed doses of amiodarone  or Eliquis .   Follow up 02/12/24. Patient returns for follow up for atrial fibrillation. Patient is s/p afib ablation 02/03/24. She remains in persistent afib. She has symptoms of SOB, orthopnea, lower extremity edema today. Of note, she is also being treated for a UTI.  No bleeding issues on anticoagulation.   On follow up 03/02/24, patient is currently in Afib. S/p successful DCCV on 10/31 but unfortunately patient had ERAF several hours after cardioversion. She is taking amiodarone  200 mg daily. No missed doses of Eliquis . Weight 87.8 kg on 10/24 compared to today's weight of 85.5. kg. She notes was seen by Nephrologist Dr. Tobie after cardioversion and received lasix  pump / injection which helped her lose about 6 lbs. She notes have SOB especially at night and fluid is back. She thinks she might still be taking amiodarone  200 mg BID.  She is currently taking Lasix  20 mg daily and potassium 20 mEq daily.  No missed doses of anticoagulant.  Today, she  denies symptoms of palpitations, chest pain, PND, dizziness, presyncope, syncope, snoring, daytime somnolence, bleeding, or neurologic sequela. The patient is tolerating medications without difficulties and is otherwise without complaint today.    she has a BMI of Body mass index is 36.79 kg/m.SABRA Filed Weights   03/02/24 1115  Weight: 85.5 kg     Current Outpatient Medications  Medication Sig Dispense Refill   acetaminophen  (TYLENOL ) 650 MG CR tablet Take 650 mg by mouth every 8 (eight) hours as needed for pain.     ALPRAZolam (XANAX XR) 0.5 MG 24 hr tablet Take 0.5 mg by mouth daily as needed for anxiety.     amiodarone  (PACERONE ) 200 MG tablet Take 1 tablet (200 mg total) by mouth daily. 90 tablet 2   amLODipine (NORVASC) 10 MG tablet Take 10 mg by mouth daily.     amoxicillin  (AMOXIL ) 500 MG  tablet Take 2 grams (4 tablets) by mouth once 30-60 minutes prior to dental procedure 4 tablet 0   amoxicillin  (AMOXIL ) 875 MG tablet Take 875 mg by mouth 2 (two) times daily.     apixaban  (ELIQUIS ) 5 MG TABS tablet Take 5 mg by mouth 2 (two) times a day.      Ascorbic Acid (VITAMIN C) 1000 MG tablet Take 1,000 mg by mouth daily.     atorvastatin  (LIPITOR) 40 MG tablet Take 40 mg by mouth every evening.      Calcium   Carbonate-Vit D-Min (CALCIUM  1200 PO) Take 1,200 mg by mouth daily.     cholecalciferol (VITAMIN D3) 25 MCG (1000 UNIT) tablet Take 1,000 Units by mouth daily.     dapagliflozin propanediol (FARXIGA) 10 MG TABS tablet Take 10 mg by mouth daily.     diltiazem  (CARDIZEM  CD) 180 MG 24 hr capsule Take 1 capsule (180 mg total) by mouth daily. 90 capsule 2   furosemide  (LASIX ) 20 MG tablet Take 1 tablet (20 mg total) by mouth daily as needed (swelling or weight gain). 90 tablet 1   lidocaine  4 % Place 1 patch onto the skin daily as needed (Pain).     metoprolol  tartrate (LOPRESSOR ) 100 MG tablet TAKE 1 TABLET BY MOUTH TWICE DAILY 180 tablet 2   Potassium Chloride  ER 20 MEQ TBCR Take 1 tablet (20 mEq total) by mouth daily as needed (when lasix  taken). 90 tablet 1   PROLIA 60 MG/ML SOSY injection Inject 60 mg into the skin every 6 (six) months.     telmisartan  (MICARDIS ) 80 MG tablet Take 80 mg by mouth daily.     No current facility-administered medications for this encounter.    Atrial Fibrillation Management history:  Previous antiarrhythmic drugs: Multaq , amiodarone  Previous cardioversions: 07/09/22, 11/03/23, 12/08/23, 02/19/24 Previous ablations: 02/03/24 Anticoagulation history: Eliquis    ROS- All systems are reviewed and negative except as per the HPI above.  Physical Exam: BP 118/70   Pulse 65   Ht 5' (1.524 m)   Wt 85.5 kg   BMI 36.79 kg/m   GEN- The patient is well appearing, alert and oriented x 3 today.   Neck - no JVD or carotid bruit noted Lungs- Crackles noted bilaterally at lower bases Heart- Irregular rate and rhythm, no murmurs, rubs or gallops, PMI not laterally displaced Extremities- 2+ edema bilateral LE Skin - no rash or ecchymosis noted   EKG today demonstrates  Vent. rate 65 BPM PR interval * ms QRS duration 160 ms QT/QTcB 508/528 ms P-R-T axes * -87 115 Ventricular-paced rhythm with occasional and consecutive supraventricular complexes Abnormal ECG When  compared with ECG of 19-Feb-2024 09:39, Previous ECG is present   Echo 07/24/21 demonstrated   1. Left ventricular ejection fraction, by estimation, is 50 to 55%. The  left ventricle has low normal function. The left ventricle has no regional  wall motion abnormalities. Left ventricular diastolic parameters are  consistent with Grade II diastolic dysfunction (pseudonormalization).   2. Right ventricular systolic function is normal. The right ventricular  size is normal. There is mildly elevated pulmonary artery systolic  pressure.   3. Left atrial size was severely dilated.   4. Mitral Valve Repair with 30 mm Physio ring annuloplasty and leaflet  repair, Cox-MAZE procedure with RF ablation and Cryo Left and Right sided  lesion sets, ligation of the atrial appendage, left atrial repair and  reconstruction with bovine  pericardium.. The mitral valve has been repaired/replaced. Mild mitral  valve regurgitation. The mean mitral valve gradient is 3.0 mmHg. Procedure  Date: 2017.   5. The aortic valve is normal in structure. Aortic valve regurgitation is  not visualized. No aortic stenosis is present.   6. The inferior vena cava is normal in size with greater than 50%  respiratory variability, suggesting right atrial pressure of 3 mmHg.    CHA2DS2-VASc Score = 4  The patient's score is based upon: CHF History: 0 HTN History: 1 Diabetes History: 0 Stroke History: 0 Vascular Disease History: 1 Age Score: 1 Gender Score: 1       ASSESSMENT AND PLAN: Longstanding Persistent Atrial Fibrillation/atrial flutter (ICD10:  I48.11) The patient's CHA2DS2-VASc score is 4, indicating a 4.8% annual risk of stroke.   S/p MAZE at [facility] Patient is s/p afib and flutter ablation 02/03/24. S/p successful DCCV on 02/19/24.   Patient is back in Afib despite recent DCCV.  I discussed case with the patient's EP and agrees with amiodarone  reload and repeat cardioversion in the blanking period.  ER  precautions given to patient regarding fluid overload and progressive shortness of breath.  Will increase Lasix  to 20 mg twice daily and have her continue potassium 20 mEq daily.  She believes she has been taking amiodarone  200 mg twice daily since last office visit on 10/24.  She will contact clinic later today to confirm.  We would then schedule her for cardioversion next week as she has essentially loaded on amiodarone  200 mg twice daily for approximately 4 weeks.  She will have to hold her Farxiga 3 days prior to cardioversion.  Patient became tearful after ER precautions were discussed due to ongoing shortness of breath.  Patient is understandably frustrated at being out of rhythm again after her most recent cardioversion. Bmet and CBC obtained in anticipation of repeat cardioversion and to recheck creatinine.   Addendum: Patient contacted clinic back later in the day noting she has been taking amiodarone  200 mg BID, lasix  20 mg BID, and potassium 20 meq BID since being seen on 10/24. I advised will not increase lasix  until labs are back to reassess creatinine (still pending).   Secondary Hypercoagulable State (ICD10:  D68.69) The patient is at significant risk for stroke/thromboembolism based upon her CHA2DS2-VASc Score of 4.  Continue Apixaban  (Eliquis ).  No missed doses.   High risk medication monitoring (ICD10: [id number]) Patient requires ongoing monitoring for anti-arrhythmic medication which has the potential to cause life threatening arrhythmias or AV block. Qtc stable. Continue amiodarone  200 mg BID and transition to once daily after cardioversion.    HTN Stable today.   Acute HFpEF EF 50-55% She appears fluid overloaded today with +2 edema and shortness of breath.  Will check be met and CBC in anticipation of repeat cardioversion.  As noted above, will continue Lasix  20 mg twice daily and potassium 20 mEq daily. Recommended patient to contact Dr. Anthony office for assistance with  fluid overload.  Informed Consent   Shared Decision Making/Informed Consent The risks (stroke, cardiac arrhythmias rarely resulting in the need for a temporary or permanent pacemaker, skin irritation or burns and complications associated with conscious sedation including aspiration, arrhythmia, respiratory failure and death), benefits (restoration of normal sinus rhythm) and alternatives of a direct current cardioversion were explained in detail to Isabel Hardy and she agrees to proceed.        Follow up 2 weeks after DCCV.    Isabel Heinrich, PA-C [facility] [place] [address] [place], [place]  [address] [phone]  

## 2024-03-03 ENCOUNTER — Other Ambulatory Visit (HOSPITAL_COMMUNITY): Payer: Self-pay | Admitting: *Deleted

## 2024-03-03 ENCOUNTER — Ambulatory Visit (HOSPITAL_COMMUNITY): Payer: Self-pay | Admitting: Internal Medicine

## 2024-03-03 DIAGNOSIS — I4819 Other persistent atrial fibrillation: Secondary | ICD-10-CM

## 2024-03-03 LAB — BASIC METABOLIC PANEL WITH GFR
BUN/Creatinine Ratio: 11 — ABNORMAL LOW (ref 12–28)
BUN: 24 mg/dL (ref 8–27)
CO2: 21 mmol/L (ref 20–29)
Calcium: 8.8 mg/dL (ref 8.7–10.3)
Chloride: 109 mmol/L — ABNORMAL HIGH (ref 96–106)
Creatinine, Ser: 2.22 mg/dL — ABNORMAL HIGH (ref 0.57–1.00)
Glucose: 91 mg/dL (ref 70–99)
Potassium: 5.2 mmol/L (ref 3.5–5.2)
Sodium: 145 mmol/L — ABNORMAL HIGH (ref 134–144)
eGFR: 23 mL/min/1.73 — ABNORMAL LOW (ref >59)

## 2024-03-03 LAB — CBC
Hematocrit: 39.5 % (ref 34.0–46.6)
Hemoglobin: 12.9 g/dL (ref 11.1–15.9)
MCH: 30.1 pg (ref 26.6–33.0)
MCHC: 32.7 g/dL (ref 31.5–35.7)
MCV: 92 fL (ref 79–97)
Platelets: 189 x10E3/uL (ref 150–450)
RBC: 4.29 x10E6/uL (ref 3.77–5.28)
RDW: 14.4 % (ref 11.7–15.4)
WBC: 4.8 x10E3/uL (ref 3.4–10.8)

## 2024-03-04 ENCOUNTER — Other Ambulatory Visit (HOSPITAL_COMMUNITY): Payer: Self-pay | Admitting: *Deleted

## 2024-03-04 DIAGNOSIS — I4819 Other persistent atrial fibrillation: Secondary | ICD-10-CM

## 2024-03-07 NOTE — Progress Notes (Signed)
 Pt called for pre procedure instructions.  Left message, encouraged to call back. Arrival time 0930 NPO after midnight explained Instructed to take am meds with sip of water and confirmed blood thinner consistency Instructed pt need for ride home tomorrow and have responsible adult with them for 24 hrs post procedure.

## 2024-03-07 NOTE — CV Procedure (Signed)
    Electrical Cardioversion Procedure Note OMARA ALCON [id number] 10-28-1950  Procedure: Electrical Cardioversion Indications:  Atrial Fibrillation  Time Out: Verified patient identification, verified procedure,medications/allergies/relevent history reviewed, required imaging and test results available.  {time out performed:[id number]}  Procedure Details  During this procedure the patient is administered a total of Propofol  *** mg and Lidocaine  *** mg to achieve and maintain moderate conscious sedation.  The patient's heart rate, blood pressure, and oxygen saturation are monitored continuously during the procedure. The period of conscious sedation is *** minutes, of which I was present face-to-face 100% of this time. *** is an independent, trained observer who assisted in the monitoring of the patient's level of consciousness.   Cardioversion was done with synchronized biphasic defibrillation with AP pads with 200 watts.  The patient converted to normal sinus rhythm. The patient tolerated the procedure well   IMPRESSION:  Successful cardioversion of atrial fibrillation    Kianna Billet 03/07/2024, 7:42 PM

## 2024-03-08 ENCOUNTER — Other Ambulatory Visit: Payer: Self-pay

## 2024-03-08 ENCOUNTER — Ambulatory Visit (HOSPITAL_COMMUNITY)
Admission: RE | Admit: 2024-03-08 | Discharge: 2024-03-08 | Disposition: A | Discharge status: Home / Self Care | Attending: Cardiology | Admitting: Cardiology

## 2024-03-08 ENCOUNTER — Ambulatory Visit (HOSPITAL_COMMUNITY): Admitting: Certified Registered"

## 2024-03-08 ENCOUNTER — Encounter (HOSPITAL_COMMUNITY)
Admission: RE | Disposition: A | Discharge status: Home / Self Care | Payer: Self-pay | Source: Home / Self Care | Attending: Cardiology

## 2024-03-08 ENCOUNTER — Encounter (HOSPITAL_COMMUNITY): Payer: Self-pay | Admitting: Cardiology

## 2024-03-08 DIAGNOSIS — I4891 Unspecified atrial fibrillation: Secondary | ICD-10-CM | POA: Diagnosis not present

## 2024-03-08 DIAGNOSIS — Z7901 Long term (current) use of anticoagulants: Secondary | ICD-10-CM | POA: Insufficient documentation

## 2024-03-08 DIAGNOSIS — Z950 Presence of cardiac pacemaker: Secondary | ICD-10-CM | POA: Diagnosis not present

## 2024-03-08 DIAGNOSIS — I5031 Acute diastolic (congestive) heart failure: Secondary | ICD-10-CM | POA: Diagnosis not present

## 2024-03-08 DIAGNOSIS — I4811 Longstanding persistent atrial fibrillation: Secondary | ICD-10-CM | POA: Diagnosis not present

## 2024-03-08 DIAGNOSIS — I4819 Other persistent atrial fibrillation: Secondary | ICD-10-CM

## 2024-03-08 DIAGNOSIS — D6869 Other thrombophilia: Secondary | ICD-10-CM | POA: Diagnosis not present

## 2024-03-08 DIAGNOSIS — I509 Heart failure, unspecified: Secondary | ICD-10-CM | POA: Diagnosis not present

## 2024-03-08 DIAGNOSIS — I495 Sick sinus syndrome: Secondary | ICD-10-CM | POA: Insufficient documentation

## 2024-03-08 DIAGNOSIS — I110 Hypertensive heart disease with heart failure: Secondary | ICD-10-CM

## 2024-03-08 DIAGNOSIS — I700 Atherosclerosis of aorta: Secondary | ICD-10-CM | POA: Insufficient documentation

## 2024-03-08 DIAGNOSIS — Z79899 Other long term (current) drug therapy: Secondary | ICD-10-CM | POA: Insufficient documentation

## 2024-03-08 DIAGNOSIS — E785 Hyperlipidemia, unspecified: Secondary | ICD-10-CM | POA: Diagnosis not present

## 2024-03-08 DIAGNOSIS — I4892 Unspecified atrial flutter: Secondary | ICD-10-CM | POA: Insufficient documentation

## 2024-03-08 HISTORY — PX: CARDIOVERSION: EP1203

## 2024-03-08 SURGERY — CARDIOVERSION (CATH LAB)
Anesthesia: General

## 2024-03-08 MED ORDER — SODIUM CHLORIDE 0.9 % IV SOLN: INTRAVENOUS | Status: DC

## 2024-03-08 MED ORDER — POTASSIUM CHLORIDE ER 20 MEQ PO TBCR: 40.0000 meq | EXTENDED_RELEASE_TABLET | Freq: Every day | ORAL | Status: DC

## 2024-03-08 MED ORDER — LIDOCAINE 2% (20 MG/ML) 5 ML SYRINGE
INTRAMUSCULAR | Status: DC | PRN
  Administered 2024-03-08: 60 mg via INTRAVENOUS

## 2024-03-08 MED ORDER — PROPOFOL 10 MG/ML IV BOLUS
INTRAVENOUS | Status: DC | PRN
  Administered 2024-03-08: 60 mg via INTRAVENOUS

## 2024-03-08 MED ORDER — FUROSEMIDE 20 MG PO TABS: 40.0000 mg | ORAL_TABLET | Freq: Every day | ORAL | Status: DC

## 2024-03-08 SURGICAL SUPPLY — 1 items: PAD DEFIB RADIO PHYSIO CONN (PAD) ×1 IMPLANT

## 2024-03-08 NOTE — Anesthesia Preprocedure Evaluation (Addendum)
 Anesthesia Evaluation  Patient identified by MRN, date of birth, ID band Patient awake    Reviewed: Allergy & Precautions, NPO status , Patient's Chart, lab work & pertinent test results  History of Anesthesia Complications Negative for: history of anesthetic complications  Airway Mallampati: II  TM Distance: >3 FB Neck ROM: Full    Dental no notable dental hx. (+) Teeth Intact,    Pulmonary neg pulmonary ROS   Pulmonary exam normal breath sounds clear to auscultation       Cardiovascular hypertension, Pt. on medications +CHF  Normal cardiovascular exam+ dysrhythmias (eliquis ) Atrial Fibrillation + pacemaker  Rhythm:Irregular Rate:Abnormal  2023TTE  1. Left ventricular ejection fraction, by estimation, is 50 to 55%. The  left ventricle has low normal function. The left ventricle has no regional  wall motion abnormalities. Left ventricular diastolic parameters are  consistent with Grade II diastolic  dysfunction (pseudonormalization).   2. Right ventricular systolic function is normal. The right ventricular  size is normal. There is mildly elevated pulmonary artery systolic  pressure.   3. Left atrial size was severely dilated.   4. Mitral Valve Repair with 30 mm Physio ring annuloplasty and leaflet  repair, Cox-MAZE procedure with RF ablation and Cryo Left and Right sided  lesion sets, ligation of the atrial appendage, left atrial repair and  reconstruction with bovine  pericardium.. The mitral valve has been repaired/replaced. Mild mitral  valve regurgitation. The mean mitral valve gradient is 3.0 mmHg. Procedure  Date: 2017.   5. The aortic valve is normal in structure. Aortic valve regurgitation is  not visualized. No aortic stenosis is present.   6. The inferior vena cava is normal in size with greater than 50%  respiratory variability, suggesting right atrial pressure of 3 mmHg.     Neuro/Psych CVA, No Residual  Symptoms negative neurological ROS     GI/Hepatic   Endo/Other    Renal/GU Renal InsufficiencyRenal disease     Musculoskeletal   Abdominal   Peds  Hematology   Anesthesia Other Findings   Reproductive/Obstetrics                              Anesthesia Physical Anesthesia Plan  ASA: 3  Anesthesia Plan: General   Post-op Pain Management: Minimal or no pain anticipated   Induction: Intravenous  PONV Risk Score and Plan: 3 and Propofol  infusion and Treatment may vary due to age or medical condition  Airway Management Planned: Natural Airway and Simple Face Mask  Additional Equipment: None  Intra-op Plan:   Post-operative Plan:   Informed Consent: I have reviewed the patients History and Physical, chart, labs and discussed the procedure including the risks, benefits and alternatives for the proposed anesthesia with the patient or authorized representative who has indicated his/her understanding and acceptance.     Dental advisory given  Plan Discussed with: CRNA  Anesthesia Plan Comments:          Anesthesia Quick Evaluation

## 2024-03-08 NOTE — Discharge Instructions (Signed)

## 2024-03-08 NOTE — Transfer of Care (Signed)
 Immediate Anesthesia Transfer of Care Note  Patient: Isabel Hardy  Procedure(s) Performed: CARDIOVERSION  Patient Location: PACU  Anesthesia Type:MAC  Level of Consciousness: awake and drowsy  Airway & Oxygen Therapy: Patient Spontanous Breathing and Patient connected to nasal cannula oxygen  Post-op Assessment: Report given to RN and Post -op Vital signs reviewed and stable  Post vital signs: Reviewed and stable  Last Vitals:  Vitals Value Taken Time  BP    Temp    Pulse    Resp    SpO2      Last Pain:  Vitals:   03/08/24 0927  TempSrc: Temporal         Complications: No notable events documented.

## 2024-03-08 NOTE — Anesthesia Postprocedure Evaluation (Signed)
 Anesthesia Post Note  Patient: Isabel Hardy  Procedure(s) Performed: CARDIOVERSION     Patient location during evaluation: PACU Anesthesia Type: General Level of consciousness: awake and alert Pain management: pain level controlled Vital Signs Assessment: post-procedure vital signs reviewed and stable Respiratory status: spontaneous breathing, nonlabored ventilation, respiratory function stable and patient connected to nasal cannula oxygen Cardiovascular status: blood pressure returned to baseline and stable Postop Assessment: no apparent nausea or vomiting Anesthetic complications: no   No notable events documented.  Last Vitals:  Vitals:   03/08/24 1028 03/08/24 1038  BP: 108/66 114/81  Pulse: 60 61  Resp: 16 15  Temp:    SpO2: 99% 95%    Last Pain:  Vitals:   03/08/24 1038  TempSrc:   PainSc: 0-No pain                 Thom JONELLE Peoples

## 2024-03-08 NOTE — Interval H&P Note (Signed)
 History and Physical Interval Note:  03/08/2024 10:02 AM  Isabel Hardy  has presented today for surgery, with the diagnosis of AFIB.  The various methods of treatment have been discussed with the patient and family. After consideration of risks, benefits and other options for treatment, the patient has consented to  Procedure(s): CARDIOVERSION (N/A) as a surgical intervention.  The patient's history has been reviewed, patient examined, no change in status, stable for surgery.  I have reviewed the patient's chart and labs.  Questions were answered to the patient's satisfaction.     Wilbert Bihari

## 2024-03-09 ENCOUNTER — Telehealth: Payer: Self-pay | Admitting: *Deleted

## 2024-03-09 ENCOUNTER — Telehealth (HOSPITAL_COMMUNITY): Payer: Self-pay | Admitting: Cardiology

## 2024-03-09 NOTE — Telephone Encounter (Signed)
 Alert received from [employer]:  Alert remote transmission:  AT/AF Daily Burden > Threshold AF/AFL in progress from 11/18 @ 14:46, controlled rates, DCCV 11/18, successful, Eliquis  per EPIC - Route to triage Follow up as scheduled. [place], CVRS _______________________________________________________________________  Called patient to notify them they are back in AF/AFL and assess for symptoms  Patient stated, I figured I had gone back into AF/AFL because I have been having some slight shortness of breath with activity, but no more than I normally do when I'min AF/AFL.  AT/AF burden 78.2%  Routing to providers for advisement and awareness  All questions and concerns addressed at time of call  Patient appreciative of phone call

## 2024-03-09 NOTE — Telephone Encounter (Signed)
 Called to confirm/remind patient of their appointment at the [facility] on 03/09/2024.   Appointment:   [x] Confirmed  [] Left mess   [] No answer/No voice mail  [] VM Full/unable to leave message  [] Phone not in service  Patient reminded to bring all medications and/or complete list.  Confirmed patient has transportation. Gave directions, instructed to utilize valet parking.

## 2024-03-10 ENCOUNTER — Other Ambulatory Visit (HOSPITAL_COMMUNITY): Payer: Self-pay

## 2024-03-10 ENCOUNTER — Other Ambulatory Visit: Payer: Self-pay

## 2024-03-10 ENCOUNTER — Telehealth (HOSPITAL_COMMUNITY): Payer: Self-pay

## 2024-03-10 ENCOUNTER — Ambulatory Visit (HOSPITAL_COMMUNITY)
Admission: RE | Admit: 2024-03-10 | Discharge: 2024-03-10 | Disposition: A | Discharge status: Home / Self Care | Source: Ambulatory Visit | Attending: Cardiology | Admitting: Cardiology

## 2024-03-10 ENCOUNTER — Encounter (HOSPITAL_COMMUNITY): Payer: Self-pay

## 2024-03-10 VITALS — BP 132/80 | HR 68 | Wt 191.6 lb

## 2024-03-10 DIAGNOSIS — Z79899 Other long term (current) drug therapy: Secondary | ICD-10-CM | POA: Insufficient documentation

## 2024-03-10 DIAGNOSIS — I4811 Longstanding persistent atrial fibrillation: Secondary | ICD-10-CM | POA: Insufficient documentation

## 2024-03-10 DIAGNOSIS — I110 Hypertensive heart disease with heart failure: Secondary | ICD-10-CM | POA: Insufficient documentation

## 2024-03-10 DIAGNOSIS — I5033 Acute on chronic diastolic (congestive) heart failure: Secondary | ICD-10-CM | POA: Diagnosis not present

## 2024-03-10 DIAGNOSIS — E8770 Fluid overload, unspecified: Secondary | ICD-10-CM | POA: Insufficient documentation

## 2024-03-10 DIAGNOSIS — Z950 Presence of cardiac pacemaker: Secondary | ICD-10-CM | POA: Insufficient documentation

## 2024-03-10 DIAGNOSIS — I4819 Other persistent atrial fibrillation: Secondary | ICD-10-CM | POA: Diagnosis not present

## 2024-03-10 DIAGNOSIS — Z7901 Long term (current) use of anticoagulants: Secondary | ICD-10-CM | POA: Diagnosis not present

## 2024-03-10 DIAGNOSIS — E785 Hyperlipidemia, unspecified: Secondary | ICD-10-CM | POA: Diagnosis not present

## 2024-03-10 DIAGNOSIS — Z952 Presence of prosthetic heart valve: Secondary | ICD-10-CM | POA: Diagnosis not present

## 2024-03-10 DIAGNOSIS — I5032 Chronic diastolic (congestive) heart failure: Secondary | ICD-10-CM | POA: Diagnosis not present

## 2024-03-10 MED ORDER — TORSEMIDE 20 MG PO TABS: 40.0000 mg | ORAL_TABLET | Freq: Every day | ORAL | 3 refills | Status: DC

## 2024-03-10 MED ORDER — FUROSCIX 80 MG/10ML ~~LOC~~ CTKT
80.0000 mg | CARTRIDGE | Freq: Every day | SUBCUTANEOUS | 5 refills | Status: DC | PRN
  Filled 2024-03-10: qty 10, 10d supply, fill #0
  Filled 2024-03-10: qty 10, fill #0

## 2024-03-10 NOTE — Progress Notes (Signed)
 Medication Samples have been provided to the patient.  Drug name: Furoscix        Strength: 80mg /15ml        Qty: 2 kits  LOT: [id number]  Exp.Date: 08/18/25  Dosing instructions: take as directed by the advanced heart failure clinic only.  The patient has been instructed regarding the correct time, dose, and frequency of taking this medication, including desired effects and most common side effects.   Isabel Hardy Isabel Hardy 11:04 AM 03/10/2024

## 2024-03-10 NOTE — Telephone Encounter (Signed)
 Advanced Heart Failure Patient Advocate Encounter  Prior authorization for Furoscix  has been submitted and approved. Test billing returns $0 for up to 90 day supply.  Key: [id number] Effective: 04/22/2023 to 04/20/2025  Rachel DEL, CPhT Rx Patient Advocate Phone: [phone]

## 2024-03-10 NOTE — Patient Instructions (Signed)
 STOP Diltiazem .  STOP Lasix .  START Torsemide 40 mg ( 2 Tab) daily.  Your provider has order Furoscix  for you. This is an on-body infuser that gives you a dose of Furosemide .   It will be shipped to your home from [facility], they will call you before shipping  Ensure you write down the time you start your infusion so that if there is a problem you will know how long the infusion lasted  Use Furoscix  only AS DIRECTED by our office  Dosing Directions:   Day 1= 03/10/24 1 kit with an extra 40 mEq ( 2 Tabs) of Potassium   Day 2= 03/11/24 1 kit with an extra 40 mEq ( 2 Tabs) of Potassium   HOLD TORSEMIDE TILL 03/12/24, THEN START 40 MG DAILY.   Your physician has requested that you have an echocardiogram. Echocardiography is a painless test that uses sound waves to create images of your heart. It provides your doctor with information about the size and shape of your heart and how well your heart's chambers and valves are working. This procedure takes approximately one hour. There are no restrictions for this procedure. Please do NOT wear cologne, perfume, aftershave, or lotions (deodorant is allowed). Please arrive 15 minutes prior to your appointment time.  Please note: We ask at that you not bring children with you during ultrasound (echo/ vascular) testing. Due to room size and safety concerns, children are not allowed in the ultrasound rooms during exams. Our front office staff cannot provide observation of children in our lobby area while testing is being conducted. An adult accompanying a patient to their appointment will only be allowed in the ultrasound room at the discretion of the ultrasound technician under special circumstances. We apologize for any inconvenience.  Your physician recommends that you schedule a follow-up appointment in: 3 weeks.  If you have any questions or concerns before your next appointment please send us  a message through [place] or  call our office at [phone].    TO LEAVE A MESSAGE FOR THE NURSE SELECT OPTION 2, PLEASE LEAVE A MESSAGE INCLUDING: YOUR NAME DATE OF BIRTH CALL BACK NUMBER REASON FOR CALL**this is important as we prioritize the call backs  YOU WILL RECEIVE A CALL BACK THE SAME DAY AS LONG AS YOU CALL BEFORE 4:00 PM  At the [facility], you and your health needs are our priority. As part of our continuing mission to provide you with exceptional heart care, we have created designated Provider Care Teams. These Care Teams include your primary Cardiologist (physician) and Advanced Practice Providers (APPs- Physician Assistants and Nurse Practitioners) who all work together to provide you with the care you need, when you need it.   You may see any of the following providers on your designated Care Team at your next follow up: Dr Toribio Fuel Dr Ezra Shuck Dr. Morene Brownie Greig Mosses, NP Caffie Shed, [place] [facility] [place], [place] Beckey Coe, NP Jordan Lee, NP Ellouise Class, NP Tinnie Redman, PharmD Jaun Bash, PharmD   Please be sure to bring in all your medications bottles to every appointment.    Thank you for choosing [facility]

## 2024-03-10 NOTE — Progress Notes (Signed)
 Specialty Pharmacy Refill Coordination Note  Isabel Hardy is a 73 y.o. female contacted today regarding refills of specialty medication(s) Furosemide  (Furoscix )   Patient requested Delivery   Delivery date: 03/15/24   Verified address: [address]   [place] [address]   Medication will be filled on: 03/14/24

## 2024-03-13 NOTE — Progress Notes (Signed)
 ADVANCED HEART FAILURE NEW PATIENT CLINIC NOTE  Referring Physician: Conley Dene BROCKS, DO  Primary Care: Conley Dene BROCKS, DO Primary Cardiologist:  HPI: Isabel Hardy is a 73 y.o. female with a PMH of HTN, HLD, mitral valve repair with surgical maze, pacemaker placement 2019, atrial flutter, and longstanding persistent atrial fibrillation despite A-fib ablation who presents for initial visit for further evaluation and treatment of chronic diastolic heart failure     Patient with prior valve surgery, her course has been subsequently complicated by difficulty control atrial fibrillation.  Previously on Multaq , when her device interrogation showed A-fib with uncontrolled rates she was transition to diltiazem .  She has subsequently undergone multiple cardioversions over the past few months with atrial fibrillation ablation 02/03/2024.  During this time, she has developed worsening shortness of breath, lower extremity edema, and orthopnea.     SUBJECTIVE:  Recent cardioversion 11/18 with successful return to sinus rhythm, unfortunately now back in atrial fibrillation.  Device interrogation reviewed, she has a significantly high pacing burden and is evident on her EKG today.  She is massively volume overloaded with 2+ pitting edema, significant orthopnea, and short of breath with even mild exertion.  She recently was given a dose of Furoscix  with good response, but has not been urinating when taking her regular Lasix .  PMH, current medications, allergies, social history, and family history reviewed in epic.  PHYSICAL EXAM: Vitals:   03/10/24 1022  BP: 132/80  Pulse: 68  SpO2: 97%   GENERAL: Well nourished and in no apparent distress at rest.  PULM: Increased work of breathing CARDIAC:  JVP: Moderately elevated         Irregular rate and rhythm.  Systolic murmur.  2+ lower extremity edema. Warm and well perfused extremities. ABDOMEN: Soft, non-tender,  non-distended. NEUROLOGIC: Patient is oriented x3 with no focal or lateralizing neurologic deficits.    ASSESSMENT & PLAN:  Acute on chronic ?diastolic heart failure: Longstanding with significant worsening in the setting of uncontrolled atrial fibrillation.  While diastolic based on her echo from 2023, she has a pacing burden of over 60% and has not had an echocardiogram since.  She is massively volume overloaded, will give 2 doses of Furoscix  over the next 2 days and then transition her to torsemide  40 mg daily. - Significantly underdosed with Lasix  of 20 mg twice daily - Furoscix  x 2 - Increase potassium supplementation on those days - Start torsemide  40 mg daily following - Given low heart rate, significant volume overload and high pacing burden would stop diltiazem  - Creatinine 2.22 on last check, would not start spironolactone at this time - After diuresis may be able to consider finerenone - Discussed the need for significant diuresis despite renal function given her volume overload state, if she were to fail outpatient diuretics in 2 weeks would recommend admission - At follow-up visit can consider stopping amlodipine and transitioning telmisartan  to Entresto - Continue Farxiga 10 mg daily  Atrial fibrillation: Prior Maze procedure with mitral valve repair as well as recent A-fib ablation.  Suspect that she has not maintained sinus rhythm given her significant volume overload, though reassuring that she can still be cardioverted. - Diuresis as above - Continue amiodarone  200 mg twice daily - Continue eliquis  5mg  BID  Mitral valve repair:  - Repeat echo as noted above    Extremely close follow up in 2 weeks, discussed admission but will attempt outpatient diuresis initially  I spent 66 minutes caring for this patient today  including face to face time, ordering and reviewing labs, reviewing records from prior EP visits, initial echo, discussing admission and close follow up,  seeing the patient, documenting in the record, and arranging follow ups.   Morene Brownie, MD Advanced Heart Failure Mechanical Circulatory Support 03/13/24

## 2024-03-14 ENCOUNTER — Other Ambulatory Visit (HOSPITAL_COMMUNITY): Payer: Self-pay

## 2024-03-23 ENCOUNTER — Ambulatory Visit (HOSPITAL_COMMUNITY)
Admission: RE | Admit: 2024-03-23 | Discharge: 2024-03-23 | Disposition: A | Source: Ambulatory Visit | Attending: Internal Medicine | Admitting: Internal Medicine

## 2024-03-23 ENCOUNTER — Encounter (HOSPITAL_COMMUNITY): Payer: Self-pay | Admitting: Internal Medicine

## 2024-03-23 VITALS — BP 132/82 | HR 74 | Ht 60.0 in | Wt 174.4 lb

## 2024-03-23 DIAGNOSIS — D6869 Other thrombophilia: Secondary | ICD-10-CM

## 2024-03-23 DIAGNOSIS — I4819 Other persistent atrial fibrillation: Secondary | ICD-10-CM

## 2024-03-23 DIAGNOSIS — Z5181 Encounter for therapeutic drug level monitoring: Secondary | ICD-10-CM | POA: Diagnosis not present

## 2024-03-23 DIAGNOSIS — N1832 Chronic kidney disease, stage 3b: Secondary | ICD-10-CM | POA: Diagnosis not present

## 2024-03-23 DIAGNOSIS — I4811 Longstanding persistent atrial fibrillation: Secondary | ICD-10-CM

## 2024-03-23 DIAGNOSIS — Z79899 Other long term (current) drug therapy: Secondary | ICD-10-CM | POA: Diagnosis not present

## 2024-03-23 NOTE — Patient Instructions (Signed)
 Hold Farxiga after 12/18   Cardioversion scheduled for:04/12/24 Tueday at 7:00am   - Arrive at the [employer] [facility] ([address])  and check in with ADMITTING at 7:00am   - Do not eat or drink anything after midnight the night prior to your procedure.   - Take all your morning medication (except diabetic medications) with a sip of water prior to arrival.  - Do NOT miss any doses of your blood thinner - if you should miss a dose or take a dose more than 4 hours late -- please notify our office immediately.  - You will not be able to drive home after your procedure. Please ensure you have a responsible adult to drive you home. You will need someone with you for 24 hours post procedure.     - Expect to be in the procedural area approximately 2 hours.   - If you feel as if you go back into normal rhythm prior to scheduled cardioversion, please notify our office immediately.   If your procedure is canceled in the cardioversion suite you will be charged a cancellation fee.      Hold below medications 72 hours prior to scheduled procedure/anesthesia. Restart medication on the following day after scheduled procedure/anesthesia Dapagliflozin (Farxiga)     For those patients who have a scheduled procedure/anesthesia on the same day of the week as their dose, hold the medication on the day of surgery.  They can take their scheduled dose the week before.  **Patients on the above medications scheduled for elective procedures that have not held the medication for the appropriate amount of time are at risk of cancellation or change in the anesthetic plan.

## 2024-03-23 NOTE — Progress Notes (Addendum)
 Primary Care Physician: Conley Dene BROCKS., MD Primary Cardiologist: None Electrophysiologist: Will Gladis Norton, MD  Referring Physician: Dr. Norton [name] is a 73 y.o. 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 She was previously on Multaq . Device interrogation by Dr. Norton showed Afib with poor ventricular response and patient started on diltiazem  180 mg daily. Patient is on Eliquis  for stroke prevention.   On evaluation today, she is currently in Afib. She notes overall hesitation to proceed with AAD therapy. She has been compliant with Eliquis . She notes to really not feel bad when in Afib and also wonders what would happen if she did nothing to treat her Afib.   On follow up 09/16/23, she is currently in Afib. Recent device interrogation showed persistent Afib with controlled rates. She has not missed any doses of Eliquis .  Follow up 8/15. Patient returns for follow up for atrial fibrillation and amiodarone  monitoring. She went back into afib on 11/20/23 after a DCCV on 7/15. There were no specific triggers that she could identify. She did feel more energetic and less SOB when in SR. She is scheduled for an ablation with Dr Norton in October. She is also having increased lower extremity edema.   On follow up 12/22/23, patient is currently in V paced rhythm. S/p successful DCCV on 8/19. No missed doses of amiodarone  or Eliquis .   Follow up 02/12/24. Patient returns for follow up for atrial fibrillation. Patient is s/p afib ablation 02/03/24. She remains in persistent afib. She has symptoms of SOB, orthopnea, lower extremity edema today. Of note, she is also being treated for a UTI.  No bleeding issues on anticoagulation.   On follow up 03/02/24, patient is currently in Afib. S/p successful DCCV on 10/31 but unfortunately patient had ERAF several hours after cardioversion. She is taking amiodarone  200 mg daily. No missed doses of Eliquis . Weight 87.8 kg on 10/24 compared to today's weight of 85.5. kg. She notes was seen by Nephrologist Dr. Tobie after cardioversion and received lasix  pump / injection which helped her lose about 6 lbs. She notes have SOB especially at night and fluid is back. She thinks she might still be taking amiodarone  200 mg BID.  She is currently taking Lasix  20 mg daily and potassium 20 mEq daily.  No missed doses of anticoagulant.  On follow-up 03/23/2024, patient is currently in A-fib.  Seen by Dr. Zenaida in 11/20 and heart failure medication regimen adjusted for improved diuresis.  She was given Furoscix  x 2 and is currently on torsemide .  She is still taking amiodarone  200 mg twice daily.  No missed doses of Eliquis .  Patient notes marked improvement with change in diuresis regimen.  She has lost significant weight compared to most recent visit on 11/20.  Today, she  denies symptoms of palpitations, chest pain, PND, dizziness, presyncope, syncope, snoring, daytime somnolence, bleeding, or neurologic sequela. The patient is tolerating medications without difficulties and is otherwise without complaint today.    she has a BMI of Body mass index is 34.06 kg/m.SABRA Filed Weights   03/23/24 1326  Weight: 79.1 kg      Current Outpatient Medications  Medication Sig Dispense Refill   acetaminophen  (TYLENOL ) 650 MG CR tablet  Take 650 mg by mouth every 8 (eight) hours as needed for pain.     ALPRAZolam (XANAX XR) 0.5 MG 24 hr tablet Take 0.5 mg by mouth daily as needed for anxiety.     amiodarone  (PACERONE ) 200 MG tablet Take 1 tablet (200 mg total) by mouth 2 (two) times daily. 180 tablet 1   amLODipine (NORVASC) 10 MG tablet Take 10 mg by mouth daily.      amoxicillin  (AMOXIL ) 500 MG tablet Take 2 grams (4 tablets) by mouth once 30-60 minutes prior to dental procedure 4 tablet 0   apixaban  (ELIQUIS ) 5 MG TABS tablet Take 5 mg by mouth 2 (two) times a day.      Ascorbic Acid (VITAMIN C) 1000 MG tablet Take 1,000 mg by mouth daily.     atorvastatin  (LIPITOR) 40 MG tablet Take 40 mg by mouth every evening.      Calcium  Carbonate-Vit D-Min (CALCIUM  1200 PO) Take 1,200 mg by mouth daily.     cholecalciferol (VITAMIN D3) 25 MCG (1000 UNIT) tablet Take 1,000 Units by mouth daily.     dapagliflozin propanediol (FARXIGA) 10 MG TABS tablet Take 10 mg by mouth daily.     lidocaine  4 % Place 1 patch onto the skin daily as needed (Pain).     metoprolol  tartrate (LOPRESSOR ) 100 MG tablet TAKE 1 TABLET BY MOUTH TWICE DAILY 180 tablet 2   Potassium Chloride  ER 20 MEQ TBCR Take 2 tablets (40 mEq total) by mouth daily.     PROLIA 60 MG/ML SOSY injection Inject 60 mg into the skin every 6 (six) months.     telmisartan  (MICARDIS ) 80 MG tablet Take 80 mg by mouth daily.     torsemide  (DEMADEX ) 20 MG tablet Take 2 tablets (40 mg total) by mouth daily. 180 tablet 3   No current facility-administered medications for this encounter.    Atrial Fibrillation Management history:  Previous antiarrhythmic drugs: Multaq , amiodarone  Previous cardioversions: 07/09/22, 11/03/23, 12/08/23, 02/19/24, 03/08/2024 Previous ablations: 02/03/24 Anticoagulation history: Eliquis    ROS- All systems are reviewed and negative except as per the HPI above.  Physical Exam: BP 132/82   Pulse 74   Ht 5' (1.524 m)   Wt 79.1 kg   BMI 34.06 kg/m   GEN- The patient is well appearing, alert and oriented x 3 today.   Neck - no JVD or carotid bruit noted Lungs- Clear to ausculation bilaterally, normal work of breathing Heart- Regular rate (V paced), no murmurs, rubs or gallops, PMI not laterally displaced Extremities- no clubbing, cyanosis; +1 edema Skin - no rash or ecchymosis  noted   EKG today demonstrates  EKG Interpretation Date/Time:  Wednesday March 23 2024 13:28:55 EST Ventricular Rate:  74 PR Interval:    QRS Duration:  168 QT Interval:  520 QTC Calculation: 577 R Axis:   241  Text Interpretation: Atrial fibrillation with frequent ventricular-paced complexes Abnormal ECG When compared with ECG of 10-Mar-2024 10:28, PREVIOUS ECG IS PRESENT Confirmed by Terra Pac ([id number]) on 03/23/2024 2:17:48 PM    Echo 07/24/21 demonstrated   1. Left ventricular ejection fraction, by estimation, is 50 to 55%. The  left ventricle has low normal function. The left ventricle has no regional  wall motion abnormalities. Left ventricular diastolic parameters are  consistent with Grade II diastolic dysfunction (pseudonormalization).   2. Right ventricular systolic function is normal. The right ventricular  size is normal. There is mildly elevated pulmonary artery systolic  pressure.   3. Left atrial  size was severely dilated.   4. Mitral Valve Repair with 30 mm Physio ring annuloplasty and leaflet  repair, Cox-MAZE procedure with RF ablation and Cryo Left and Right sided  lesion sets, ligation of the atrial appendage, left atrial repair and  reconstruction with bovine  pericardium.. The mitral valve has been repaired/replaced. Mild mitral  valve regurgitation. The mean mitral valve gradient is 3.0 mmHg. Procedure  Date: 2017.   5. The aortic valve is normal in structure. Aortic valve regurgitation is  not visualized. No aortic stenosis is present.   6. The inferior vena cava is normal in size with greater than 50%  respiratory variability, suggesting right atrial pressure of 3 mmHg.    CHA2DS2-VASc Score = 4  The patient's score is based upon: CHF History: 0 HTN History: 1 Diabetes History: 0 Stroke History: 0 Vascular Disease History: 1 Age Score: 1 Gender Score: 1       ASSESSMENT AND PLAN: Longstanding Persistent Atrial Fibrillation/atrial flutter  (ICD10:  I48.11) The patient's CHA2DS2-VASc score is 4, indicating a 4.8% annual risk of stroke.   S/p [facility]ZE at [facility] Patient is s/p afib and flutter ablation 02/03/24. S/p successful DCCV on 02/19/24.  S/p successful DCCV on 03/08/2024.  Patient is currently in A-fib.  She has had to ERAF after both cardioversions but as noted by Dr. Zenaida to have significant volume overload.  Due to this, I think it is reasonable to consider a repeat cardioversion to see if can maintain sinus rhythm on amiodarone .  Previous discussion with Dr. Inocencio and it was noted there are limited options for rhythm control. We discussed the procedure cardioversion to try to convert to NSR. We discussed the risks vs benefits of this procedure and how ultimately we cannot predict whether a patient will have early return of arrhythmia post procedure. After discussion, the patient wishes to proceed with cardioversion. Labs drawn today.  Patient advised to hold Farxiga 3 days prior to procedure.  Informed Consent   Shared Decision Making/Informed Consent The risks (stroke, cardiac arrhythmias rarely resulting in the need for a temporary or permanent pacemaker, skin irritation or burns and complications associated with conscious sedation including aspiration, arrhythmia, respiratory failure and death), benefits (restoration of normal sinus rhythm) and alternatives of a direct current cardioversion were explained in detail to Ms. Lites and she agrees to proceed.        Secondary Hypercoagulable State (ICD10:  D68.69) The patient is at significant risk for stroke/thromboembolism based upon her CHA2DS2-VASc Score of 4.  Continue Apixaban  (Eliquis ).  No missed doses.  High risk medication monitoring (ICD10: [id number]) Patient requires ongoing monitoring for anti-arrhythmic medication which has the potential to cause life threatening arrhythmias or AV block. Qtc stable. Continue amiodarone  200 mg twice daily.   HTN Stable  today.  Acute HFpEF EF 50-55% Patient seen by Dr. Zenaida on 11/20 and given Furoscix  x 2 with the initiation of torsemide  40 mg daily.  Consideration to begin patient on Entresto.   Follow up with A-fib clinic as scheduled after DCCV.    Dorn Heinrich, PA-C Afib Clinic [facility] [address] [place], [place] [address] [phone]

## 2024-03-24 ENCOUNTER — Ambulatory Visit (HOSPITAL_COMMUNITY): Payer: Self-pay | Admitting: Internal Medicine

## 2024-03-24 LAB — BASIC METABOLIC PANEL WITH GFR
BUN/Creatinine Ratio: 11 — ABNORMAL LOW (ref 12–28)
BUN: 34 mg/dL — ABNORMAL HIGH (ref 8–27)
CO2: 27 mmol/L (ref 20–29)
Calcium: 9 mg/dL (ref 8.7–10.3)
Chloride: 103 mmol/L (ref 96–106)
Creatinine, Ser: 3.05 mg/dL — ABNORMAL HIGH (ref 0.57–1.00)
Glucose: 101 mg/dL — ABNORMAL HIGH (ref 70–99)
Potassium: 3.5 mmol/L (ref 3.5–5.2)
Sodium: 149 mmol/L — ABNORMAL HIGH (ref 134–144)
eGFR: 16 mL/min/1.73 — ABNORMAL LOW (ref >59)

## 2024-03-24 LAB — CBC
Hematocrit: 42.4 % (ref 34.0–46.6)
Hemoglobin: 13.8 g/dL (ref 11.1–15.9)
MCH: 29.6 pg (ref 26.6–33.0)
MCHC: 32.5 g/dL (ref 31.5–35.7)
MCV: 91 fL (ref 79–97)
Platelets: 193 x10E3/uL (ref 150–450)
RBC: 4.67 x10E6/uL (ref 3.77–5.28)
RDW: 14.5 % (ref 11.7–15.4)
WBC: 5.5 x10E3/uL (ref 3.4–10.8)

## 2024-03-28 ENCOUNTER — Telehealth (HOSPITAL_COMMUNITY): Payer: Self-pay

## 2024-03-28 NOTE — Progress Notes (Signed)
 ADVANCED HEART FAILURE NEW PATIENT CLINIC NOTE  Referring Physician: Conley Dene BROCKS., MD  Primary Care: Conley Dene BROCKS., MD Primary Cardiologist:  HPI: Isabel Hardy is a 73 y.o. female with a PMH of HTN, HLD, mitral valve repair with surgical maze, pacemaker placement 2019, atrial flutter, and longstanding persistent atrial fibrillation despite A-fib ablation who presents for initial visit for further evaluation and treatment of chronic diastolic heart failure     Patient with prior valve surgery, her course has been subsequently complicated by difficulty control atrial fibrillation.  Previously on Multaq , when her device interrogation showed A-fib with uncontrolled rates she was transition to diltiazem .  She has subsequently undergone multiple cardioversions over the past few months with atrial fibrillation ablation 02/03/2024.  During this time, she has developed worsening shortness of breath, lower extremity edema, and orthopnea.     SUBJECTIVE:  Recent cardioversion 11/18 with successful return to sinus rhythm, unfortunately now back in atrial fibrillation.  Device interrogation reviewed, she has a significantly high pacing burden and is evident on her EKG today.  She is massively volume overloaded with 2+ pitting edema, significant orthopnea, and short of breath with even mild exertion.  She recently was given a dose of Furoscix  with good response, but has not been urinating when taking her regular Lasix .  PMH, current medications, allergies, social history, and family history reviewed in epic.  PHYSICAL EXAM: There were no vitals filed for this visit.  GENERAL: Well nourished and in no apparent distress at rest.  PULM: Increased work of breathing CARDIAC:  JVP: Moderately elevated         Irregular rate and rhythm.  Systolic murmur.  2+ lower extremity edema. Warm and well perfused extremities. ABDOMEN: Soft, non-tender, non-distended. NEUROLOGIC: Patient is  oriented x3 with no focal or lateralizing neurologic deficits.    ASSESSMENT & PLAN:  Acute on chronic ?diastolic heart failure: Longstanding with significant worsening in the setting of uncontrolled atrial fibrillation.  While diastolic based on her echo from 2023, she has a pacing burden of over 60% and has not had an echocardiogram since.  She is massively volume overloaded, will give 2 doses of Furoscix  over the next 2 days and then transition her to torsemide  40 mg daily. - Significantly underdosed with Lasix  of 20 mg twice daily - Furoscix  x 2 - Increase potassium supplementation on those days - Start torsemide  40 mg daily following - Given low heart rate, significant volume overload and high pacing burden would stop diltiazem  - Creatinine 2.22 on last check, would not start spironolactone at this time - After diuresis may be able to consider finerenone - Discussed the need for significant diuresis despite renal function given her volume overload state, if she were to fail outpatient diuretics in 2 weeks would recommend admission - At follow-up visit can consider stopping amlodipine and transitioning telmisartan  to Entresto - Continue Farxiga 10 mg daily  Atrial fibrillation: Prior Maze procedure with mitral valve repair as well as recent A-fib ablation.  Suspect that she has not maintained sinus rhythm given her significant volume overload, though reassuring that she can still be cardioverted. - Diuresis as above - Continue amiodarone  200 mg twice daily - Continue eliquis  5mg  BID  Mitral valve repair:  - Repeat echo as noted above    Extremely close follow up in 2 weeks, discussed admission but will attempt outpatient diuresis initially  I spent 66 minutes caring for this patient today including face to face time, ordering and  reviewing labs, reviewing records from prior EP visits, initial echo, discussing admission and close follow up, seeing the patient, documenting in the  record, and arranging follow ups.   Morene Brownie, MD Advanced Heart Failure Mechanical Circulatory Support 03/28/24

## 2024-03-28 NOTE — Telephone Encounter (Signed)
 Called to confirm/remind patient of their appointment at the Advanced [facility] Failure Clinic on 03/29/24.   Appointment:   [x] Confirmed  [] Left mess   [] No answer/No voice mail  [] VM Full/unable to leave message  [] Phone not in service  Patient reminded to bring all medications and/or complete list.  Confirmed patient has transportation. Gave directions, instructed to utilize valet parking.

## 2024-03-29 ENCOUNTER — Ambulatory Visit (HOSPITAL_COMMUNITY): Admission: RE | Admit: 2024-03-29 | Discharge: 2024-03-29 | Attending: Family Medicine

## 2024-03-29 ENCOUNTER — Encounter (HOSPITAL_COMMUNITY): Payer: Self-pay

## 2024-03-29 VITALS — BP 132/88 | HR 66 | Ht 60.0 in | Wt 176.6 lb

## 2024-03-29 DIAGNOSIS — Z9889 Other specified postprocedural states: Secondary | ICD-10-CM

## 2024-03-29 DIAGNOSIS — I4891 Unspecified atrial fibrillation: Secondary | ICD-10-CM

## 2024-03-29 DIAGNOSIS — I5032 Chronic diastolic (congestive) heart failure: Secondary | ICD-10-CM | POA: Diagnosis not present

## 2024-03-29 NOTE — Patient Instructions (Signed)
  Special Instructions // Education:  COMPRESSION STOCKING PRESCRIPTION GIVEN TODAY   Follow-Up in: 4-6 WEEKS WITH APP AS SCHEDULED   At the [facility], you and your health needs are our priority. We have a designated team specialized in the treatment of Heart Failure. This Care Team includes your primary Heart Failure Specialized Cardiologist (physician), Advanced Practice Providers (APPs- Physician Assistants and Nurse Practitioners), and Pharmacist who all work together to provide you with the care you need, when you need it.   You may see any of the following providers on your designated Care Team at your next follow up:  Dr. Toribio Fuel Dr. Ezra Shuck Dr. Odis Brownie Greig Mosses, NP Caffie Shed, [place] [facility] [place], [place] Beckey Coe, NP Jordan Lee, NP Tinnie Redman, PharmD   Please be sure to bring in all your medications bottles to every appointment.   Need to Contact Us :  If you have any questions or concerns before your next appointment please send us  a message through [place] or call our office at [phone].    TO LEAVE A MESSAGE FOR THE NURSE SELECT OPTION 2, PLEASE LEAVE A MESSAGE INCLUDING: YOUR NAME DATE OF BIRTH CALL BACK NUMBER REASON FOR CALL**this is important as we prioritize the call backs  YOU WILL RECEIVE A CALL BACK THE SAME DAY AS LONG AS YOU CALL BEFORE 4:00 PM

## 2024-03-30 DIAGNOSIS — N179 Acute kidney failure, unspecified: Secondary | ICD-10-CM | POA: Diagnosis not present

## 2024-03-30 DIAGNOSIS — N1832 Chronic kidney disease, stage 3b: Secondary | ICD-10-CM | POA: Diagnosis not present

## 2024-03-30 DIAGNOSIS — D631 Anemia in chronic kidney disease: Secondary | ICD-10-CM | POA: Diagnosis not present

## 2024-04-12 ENCOUNTER — Ambulatory Visit (HOSPITAL_COMMUNITY): Admission: RE | Admit: 2024-04-12 | Discharge: 2024-04-12 | Disposition: A | Discharge status: Home / Self Care

## 2024-04-12 ENCOUNTER — Encounter (HOSPITAL_COMMUNITY): Admission: RE | Disposition: A | Payer: Self-pay

## 2024-04-12 ENCOUNTER — Ambulatory Visit (HOSPITAL_COMMUNITY)

## 2024-04-12 DIAGNOSIS — Z539 Procedure and treatment not carried out, unspecified reason: Secondary | ICD-10-CM | POA: Insufficient documentation

## 2024-04-12 DIAGNOSIS — I4819 Other persistent atrial fibrillation: Secondary | ICD-10-CM | POA: Insufficient documentation

## 2024-04-12 DIAGNOSIS — I480 Paroxysmal atrial fibrillation: Secondary | ICD-10-CM

## 2024-04-12 SURGERY — CARDIOVERSION (CATH LAB)
Anesthesia: Monitor Anesthesia Care

## 2024-04-12 NOTE — Anesthesia Preprocedure Evaluation (Addendum)
"                                    Anesthesia Evaluation  Patient identified by MRN, date of birth, ID band Patient awake    Reviewed: Allergy & Precautions, NPO status , Patient's Chart, lab work & pertinent test results, reviewed documented beta blocker date and time   History of Anesthesia Complications Negative for: history of anesthetic complications  Airway Mallampati: II  TM Distance: >3 FB Neck ROM: Full    Dental  (+) Teeth Intact, Dental Advisory Given   Pulmonary    breath sounds clear to auscultation       Cardiovascular hypertension, Pt. on medications and Pt. on home beta blockers + dysrhythmias (on Eliquis  and Amiodarone ) Atrial Fibrillation + pacemaker + Valvular Problems/Murmurs (s/p MVr)  Rhythm:Irregular Rate:Normal  TTE:  1. Left ventricular ejection fraction, by estimation, is 50 to 55%. The  left ventricle has low normal function. The left ventricle has no regional  wall motion abnormalities. Left ventricular diastolic parameters are  consistent with Grade II diastolic dysfunction (pseudonormalization).   2. Right ventricular systolic function is normal. The right ventricular  size is normal. There is mildly elevated pulmonary artery systolic  pressure.   3. Left atrial size was severely dilated.   4. Mitral Valve Repair with 30 mm Physio ring annuloplasty and leaflet  repair, Cox-MAZE procedure with RF ablation and Cryo Left and Right sided  lesion sets, ligation of the atrial appendage, left atrial repair and  reconstruction with bovine pericardium.. The mitral valve has been repaired/replaced. Mild mitral valve regurgitation. The mean mitral valve gradient is 3.0 mmHg. Procedure Date: 2017.   5. The aortic valve is normal in structure. Aortic valve regurgitation is  not visualized. No aortic stenosis is present.   6. The inferior vena cava is normal in size with greater than 50%  respiratory variability, suggesting right atrial pressure of  3 mmHg.      Neuro/Psych TIA   GI/Hepatic   Endo/Other    Renal/GU Renal disease (AKI on CKD (Cr up to 3.05 2/2 Medications - stopped 03/23/24))  Atrophic R Kidney     Musculoskeletal   Abdominal   Peds  Hematology   Anesthesia Other Findings   Reproductive/Obstetrics                              Anesthesia Physical Anesthesia Plan  ASA: 3  Anesthesia Plan: General   Post-op Pain Management:    Induction: Intravenous  PONV Risk Score and Plan: 3 and Treatment may vary due to age or medical condition  Airway Management Planned: Natural Airway and Simple Face Mask  Additional Equipment:   Intra-op Plan:   Post-operative Plan:   Informed Consent:      Dental advisory given  Plan Discussed with: CRNA  Anesthesia Plan Comments:          Anesthesia Quick Evaluation  "

## 2024-04-12 NOTE — Progress Notes (Signed)
 Brief Note Patient presented for DCCV and was found to be A-paced on device interrogation and no atrial fibrillation since December 17th, 2026. DCCV aborted.  Isabel Hardy. Ren Ny, MD [facility]

## 2024-04-18 ENCOUNTER — Ambulatory Visit (HOSPITAL_COMMUNITY)
Admission: RE | Admit: 2024-04-18 | Discharge: 2024-04-18 | Disposition: A | Discharge status: Home / Self Care | Source: Ambulatory Visit | Attending: Cardiology | Admitting: Cardiology

## 2024-04-18 DIAGNOSIS — I5032 Chronic diastolic (congestive) heart failure: Secondary | ICD-10-CM | POA: Insufficient documentation

## 2024-04-18 DIAGNOSIS — Z950 Presence of cardiac pacemaker: Secondary | ICD-10-CM | POA: Diagnosis not present

## 2024-04-18 DIAGNOSIS — Z952 Presence of prosthetic heart valve: Secondary | ICD-10-CM | POA: Insufficient documentation

## 2024-04-18 LAB — ECHOCARDIOGRAM COMPLETE
Area-P 1/2: 2.73 cm2
Calc EF: 59.6 %
S' Lateral: 3.2 cm
Single Plane A2C EF: 60.5 %
Single Plane A4C EF: 58.2 %

## 2024-05-03 ENCOUNTER — Telehealth (HOSPITAL_COMMUNITY): Payer: Self-pay

## 2024-05-03 NOTE — Progress Notes (Signed)
 "  ADVANCED HEART FAILURE CLINIC NOTE   Primary Care: Conley Dene BROCKS., MD Primary Cardiologist: Dr. Edwyna EP: Dr. Inocencio Nephrology: Dr. Tobie HF Cardiologist: Dr. Zenaida  HPI: Isabel Hardy is a 74 y.o. female with a PMH of HTN, HLD, mitral valve repair with surgical maze, pacemaker placement 2019, atrial flutter, HFpEF, and longstanding persistent atrial fibrillation despite A-fib ablation.     Patient with prior valve surgery, her course has been subsequently complicated by difficulty control atrial fibrillation.  Previously on Multaq , when her device interrogation showed A-fib with uncontrolled rates she was transition to diltiazem .  She has subsequently undergone multiple cardioversions over the past few months with atrial fibrillation ablation 02/03/2024.  S/p DCCV to NSR 03/08/24. Unfortunately had ERAF with volume overload and significantly high pacing burden on device interrogation. Instructed to use Furoscix  daily x 2 days  Follow up in AF clinic 03/23/24, arranged for DCCV, however procedure aborted as she was in SR.  Echo 12/25 showed EF 55-60%, RV normal, stable mitral valve prosthesis      SUBJECTIVE: Today she returns for HF follow up with her husband. Overall feeling fine. Limited physically by knee pain but no SOB walking on flat ground. Denies palpitations, abnormal bleeding, CP, dizziness, edema, or PND/Orthopnea. Appetite ok. Weight at home 168 pounds. Taking all medications. Saw nephrology and was started on metolazone  5 mg 2x/week and torsemide  decreased to 20 mg daily.   PMH, current medications, allergies, social history, and family history reviewed in epic.  Wt Readings from Last 3 Encounters:  05/04/24 77.7 kg (171 lb 6.4 oz)  03/29/24 80.1 kg (176 lb 9.6 oz)  03/23/24 79.1 kg (174 lb 6.4 oz)   BP 122/76   Pulse 66   Wt 77.7 kg (171 lb 6.4 oz)   SpO2 98%   BMI 33.47 kg/m   PHYSICAL EXAM: General:  NAD. No resp difficulty, arrived in  [facility] HEENT: Normal Neck: Supple. No JVD. Cor: Regular rate & rhythm. No rubs, gallops or murmurs. Lungs: Clear Abdomen: Soft, obese, nontender, nondistended.  Extremities: No cyanosis, clubbing, rash, trace ankle edema Neuro: Alert & oriented x 3, moves all 4 extremities w/o difficulty. Affect pleasant.  Device interrogation (personally reviewed): 0.3% AF burden, 97.7% A paced, 3.3 hr/day activity   ASSESSMENT & PLAN:  Chronic ?diastolic heart failure: Longstanding with significant worsening in the setting of uncontrolled atrial fibrillation.  While diastolic based on her echo from 2023, she has a pacing burden of over 60% and has not had an echocardiogram since. Echo 12/25 showed EF 55-60%, normal RV, stable mitral valve prosthesis. -  Improved NYHA II, volume looks good today. - GDMT limited by recent AKI. - Per nephrology, she is on metolazone  5 mg 2x/week - Continue torsemide  20 mg daily. - Off Farxiga and telmisartan  with worsening SCr. - Off spiro with SCr, could consider finerenone - With low HR and labile volume, she is off diltiazem . - If SCr settles down, consider stopping amlodipine and adding Entresto next visit. - Continue compression hose. - Labs today.  Atrial fibrillation: Prior Maze procedure with mitral valve repair as well as recent A-fib ablation.  Now followed by AF Clinic. 0.3% AF burden on device interrogation today. - Continue amiodarone  200 mg bid. - Continue Eliquis  5 mg bid. No bleeding issues.  Mitral valve repair:  - stable on recent echo (12/25)  Follow up in 3-4 months with Dr. Zenaida. Consider graduation for AHF clinic and continued follow up with Dr. Revankar.  Harlene Gainer, FNP-BC 05/04/2024 "

## 2024-05-03 NOTE — Telephone Encounter (Signed)
 Called to confirm/remind patient of their appointment at the Advanced Heart Failure Clinic on 05/04/24.   Appointment:   [] Confirmed  [x] Left mess   [] No answer/No voice mail  [] VM Full/unable to leave message  [] Phone not in service  And to bring in all medications and/or complete list.

## 2024-05-04 ENCOUNTER — Telehealth (HOSPITAL_COMMUNITY): Payer: Self-pay | Admitting: *Deleted

## 2024-05-04 ENCOUNTER — Ambulatory Visit (HOSPITAL_COMMUNITY): Payer: Self-pay | Admitting: Family Medicine

## 2024-05-04 ENCOUNTER — Encounter (HOSPITAL_COMMUNITY): Payer: Self-pay

## 2024-05-04 ENCOUNTER — Ambulatory Visit (HOSPITAL_COMMUNITY)
Admission: RE | Admit: 2024-05-04 | Discharge: 2024-05-04 | Disposition: A | Discharge status: Home / Self Care | Source: Ambulatory Visit | Attending: Family Medicine | Admitting: Family Medicine

## 2024-05-04 VITALS — BP 122/76 | HR 66 | Wt 171.4 lb

## 2024-05-04 DIAGNOSIS — I5032 Chronic diastolic (congestive) heart failure: Secondary | ICD-10-CM | POA: Insufficient documentation

## 2024-05-04 DIAGNOSIS — Z9889 Other specified postprocedural states: Secondary | ICD-10-CM | POA: Diagnosis not present

## 2024-05-04 DIAGNOSIS — M25569 Pain in unspecified knee: Secondary | ICD-10-CM | POA: Insufficient documentation

## 2024-05-04 DIAGNOSIS — I4891 Unspecified atrial fibrillation: Secondary | ICD-10-CM | POA: Diagnosis not present

## 2024-05-04 DIAGNOSIS — Z950 Presence of cardiac pacemaker: Secondary | ICD-10-CM | POA: Insufficient documentation

## 2024-05-04 DIAGNOSIS — I4811 Longstanding persistent atrial fibrillation: Secondary | ICD-10-CM | POA: Diagnosis not present

## 2024-05-04 DIAGNOSIS — I491 Atrial premature depolarization: Secondary | ICD-10-CM | POA: Diagnosis not present

## 2024-05-04 DIAGNOSIS — Z79899 Other long term (current) drug therapy: Secondary | ICD-10-CM | POA: Insufficient documentation

## 2024-05-04 DIAGNOSIS — I110 Hypertensive heart disease with heart failure: Secondary | ICD-10-CM | POA: Insufficient documentation

## 2024-05-04 DIAGNOSIS — Z7901 Long term (current) use of anticoagulants: Secondary | ICD-10-CM | POA: Insufficient documentation

## 2024-05-04 DIAGNOSIS — E785 Hyperlipidemia, unspecified: Secondary | ICD-10-CM | POA: Insufficient documentation

## 2024-05-04 LAB — BASIC METABOLIC PANEL WITH GFR
Anion gap: 16 — ABNORMAL HIGH (ref 5–15)
BUN: 83 mg/dL — ABNORMAL HIGH (ref 8–23)
CO2: 35 mmol/L — ABNORMAL HIGH (ref 22–32)
Calcium: 9.7 mg/dL (ref 8.9–10.3)
Chloride: 96 mmol/L — ABNORMAL LOW (ref 98–111)
Creatinine, Ser: 4.09 mg/dL — ABNORMAL HIGH (ref 0.44–1.00)
GFR, Estimated: 11 mL/min — ABNORMAL LOW
Glucose, Bld: 113 mg/dL — ABNORMAL HIGH (ref 70–99)
Potassium: 3.3 mmol/L — ABNORMAL LOW (ref 3.5–5.1)
Sodium: 146 mmol/L — ABNORMAL HIGH (ref 135–145)

## 2024-05-04 LAB — PRO BRAIN NATRIURETIC PEPTIDE: Pro Brain Natriuretic Peptide: 1779 pg/mL — ABNORMAL HIGH

## 2024-05-04 MED ORDER — TORSEMIDE 20 MG PO TABS: 20.0000 mg | ORAL_TABLET | Freq: Every day | ORAL | 3 refills | Status: AC

## 2024-05-04 MED ORDER — METOLAZONE 2.5 MG PO TABS: 5.0000 mg | ORAL_TABLET | ORAL | 1 refills | Status: DC

## 2024-05-04 NOTE — Telephone Encounter (Signed)
 Called patient per Isabel Gainer, NP with following:  Renal function is markedly worse. STOP all metolazone . Please call and ask her what dose of metolazone  she has been taking. Repeat BMET in 1 week  Pt has been taking 2.5 mg of metolazone  twice weekly. She verbalized understanding of stopping metolazone . Repeat lab ordered and scheduled.

## 2024-05-04 NOTE — Patient Instructions (Signed)
 Medication Changes:  No Changes In Medications at this time.   Lab Work:  Labs done today, your results will be available in MyChart, we will contact you for abnormal readings.  Follow-Up in: 4 MONTHS WITH DR. ZENAIDA PLEASE CALL OUR OFFICE AROUND MARCH TO GET SCHEDULED FOR YOUR APPOINTMENT. PHONE NUMBER IS [phone] OPTION 2   At the Advanced [facility] Failure Clinic, you and your health needs are our priority. We have a designated team specialized in the treatment of Heart Failure. This Care Team includes your primary Heart Failure Specialized Cardiologist (physician), Advanced Practice Providers (APPs- Physician Assistants and Nurse Practitioners), and Pharmacist who all work together to provide you with the care you need, when you need it.   You may see any of the following providers on your designated Care Team at your next follow up:  Dr. Toribio Fuel Dr. Ezra Shuck Dr. Odis Zenaida Greig Mosses, NP Caffie Shed, [place] [facility] [place], [place] Beckey Coe, NP Jordan Lee, NP Tinnie Redman, PharmD   Please be sure to bring in all your medications bottles to every appointment.   Need to Contact Us :  If you have any questions or concerns before your next appointment please send us  a message through [place] or call our office at [phone].    TO LEAVE A MESSAGE FOR THE NURSE SELECT OPTION 2, PLEASE LEAVE A MESSAGE INCLUDING: YOUR NAME DATE OF BIRTH CALL BACK NUMBER REASON FOR CALL**this is important as we prioritize the call backs  YOU WILL RECEIVE A CALL BACK THE SAME DAY AS LONG AS YOU CALL BEFORE 4:00 PM

## 2024-05-05 ENCOUNTER — Ambulatory Visit (INDEPENDENT_AMBULATORY_CARE_PROVIDER_SITE_OTHER)
Admission: RE | Admit: 2024-05-05 | Discharge: 2024-05-05 | Disposition: A | Source: Home / Self Care | Attending: Internal Medicine | Admitting: Internal Medicine

## 2024-05-05 ENCOUNTER — Encounter (HOSPITAL_COMMUNITY): Payer: Self-pay | Admitting: Internal Medicine

## 2024-05-05 VITALS — BP 114/84 | HR 60 | Ht 60.0 in | Wt 170.4 lb

## 2024-05-05 DIAGNOSIS — I4819 Other persistent atrial fibrillation: Secondary | ICD-10-CM | POA: Diagnosis not present

## 2024-05-05 DIAGNOSIS — Z79899 Other long term (current) drug therapy: Secondary | ICD-10-CM | POA: Diagnosis not present

## 2024-05-05 DIAGNOSIS — Z5181 Encounter for therapeutic drug level monitoring: Secondary | ICD-10-CM

## 2024-05-05 DIAGNOSIS — D6869 Other thrombophilia: Secondary | ICD-10-CM

## 2024-05-05 MED ORDER — AMIODARONE HCL 200 MG PO TABS: 200.0000 mg | ORAL_TABLET | Freq: Every day | ORAL | 3 refills | Status: AC

## 2024-05-05 NOTE — Patient Instructions (Signed)
 Decrease amiodarone  (PACERONE ) 200 MG once daily

## 2024-05-05 NOTE — Progress Notes (Signed)
 "   Primary Care Physician: Conley Dene BROCKS., MD Primary Cardiologist: Hardy Electrophysiologist: Will Gladis Norton, MD  Referring Physician: Dr. Norton [name] Isabel is a 74 y.o. female with a history of HTN, HLD, aortic atherosclerosis by CT imaging, mitral valve repair with surgical maze and left atrial appendage clipping, tachybrady syndrome s/p Medtronic dual-chamber pacemaker in 2019, atrial flutter, and persistent atrial fibrillation who presents for follow up in the [facility]. She was previously on Multaq . Device interrogation by Dr. Norton showed Afib with poor ventricular response and patient started on diltiazem  180 mg daily. Patient is on Eliquis  for stroke prevention.   On evaluation today, she is currently in Afib. She notes overall hesitation to proceed with AAD therapy. She has been compliant with Eliquis . She notes to really not feel bad when in Afib and also wonders what would happen if she did nothing to treat her Afib.   On follow up 09/16/23, she is currently in Afib. Recent device interrogation showed persistent Afib with controlled rates. She has not missed any doses of Eliquis .  Follow up 8/15. Patient returns for follow up for atrial fibrillation and amiodarone  monitoring. She went back into afib on 11/20/23 after a DCCV on 7/15. There were no specific triggers that she could identify. She did feel more energetic and less SOB when in SR. She is scheduled for an ablation with Dr Norton in October. She is also having increased lower extremity edema.   On follow up 12/22/23, patient is currently in V paced rhythm. S/p successful DCCV on 8/19. No missed doses of amiodarone  or Eliquis .   Follow up 02/12/24. Patient returns for follow up for atrial fibrillation. Patient is s/p afib ablation 02/03/24. She remains in persistent afib. She has symptoms of SOB, orthopnea, lower extremity edema today. Of note, she is also being treated for a UTI.  No bleeding issues on anticoagulation.   On follow up 03/02/24, patient is currently in Afib. S/p successful DCCV on 10/31 but unfortunately patient had ERAF several hours after cardioversion. She is taking amiodarone  200 mg daily. No missed doses of Eliquis . Weight 87.8 kg on 10/24 compared to today's weight of 85.5. kg. She notes was seen by Nephrologist Dr. Tobie after cardioversion and received lasix  pump / injection which helped her lose about 6 lbs. She notes have SOB especially at night and fluid is back. She thinks she might still be taking amiodarone  200 mg BID.  She is currently taking Lasix  20 mg daily and potassium 20 mEq daily.  No missed doses of anticoagulant.  On follow-up 03/23/2024, patient is currently in A-fib.  Seen by Dr. Zenaida in 11/20 and heart failure medication regimen adjusted for improved diuresis.  She was given Furoscix  x 2 and is currently on torsemide .  She is still taking amiodarone  200 mg twice daily.  No missed doses of Eliquis .  Patient notes marked improvement with change in diuresis regimen.  She has lost significant weight compared to most recent visit on 11/20.  Follow up 05/05/24, patient is currently in A paced rhythm. She presented for DCCV on 04/12/24 and was found to be in A-paced rhythm; DCCV was aborted. Patient is taking amiodarone  200 mg BID. No missed doses of Eliquis . Review of notes show patient was recommended to stop metolazone  due to worsening renal function. She is seeing her nephrologist in a few weeks.   Today, she  denies symptoms of palpitations, chest pain, PND, dizziness, presyncope,  syncope, snoring, daytime somnolence, bleeding, or neurologic sequela. The patient is tolerating medications without difficulties and is otherwise without complaint today.    she has a BMI of Body mass index is 33.28 kg/m.SABRA Filed Weights   05/05/24 1107  Weight: 77.3 kg     Current Outpatient Medications  Medication Sig Dispense Refill   acetaminophen   (TYLENOL ) 500 MG tablet Take 1,000 mg by mouth every 6 (six) hours as needed (pain.).     ALPRAZolam (XANAX XR) 0.5 MG 24 hr tablet Take 0.5 mg by mouth daily as needed for anxiety.     amiodarone  (PACERONE ) 200 MG tablet Take 1 tablet (200 mg total) by mouth 2 (two) times daily. 180 tablet 1   amLODipine (NORVASC) 10 MG tablet Take 10 mg by mouth daily.     amoxicillin  (AMOXIL ) 500 MG tablet Take 2 grams (4 tablets) by mouth once 30-60 minutes prior to dental procedure 4 tablet 0   apixaban  (ELIQUIS ) 5 MG TABS tablet Take 5 mg by mouth 2 (two) times a day.      Ascorbic Acid (VITAMIN C) 1000 MG tablet Take 1,000 mg by mouth daily.     atorvastatin  (LIPITOR) 40 MG tablet Take 40 mg by mouth every evening.      Calcium  Carbonate-Vit D-Min (CALCIUM  1200 PO) Take 1,200 mg by mouth daily.     Camphor-Menthol -Methyl Sal (SALONPAS) 3.04-26-08 % PTCH Place 1 patch onto the skin daily as needed (pain.).     cholecalciferol (VITAMIN D3) 25 MCG (1000 UNIT) tablet Take 1,000 Units by mouth daily.     metolazone  (ZAROXOLYN ) 2.5 MG tablet Take 5 mg by mouth 2 (two) times daily.     metoprolol  tartrate (LOPRESSOR ) 100 MG tablet TAKE 1 TABLET BY MOUTH TWICE DAILY 180 tablet 2   Potassium Chloride  ER 20 MEQ TBCR Take 2 tablets (40 mEq total) by mouth daily.     PROLIA 60 MG/ML SOSY injection Inject 60 mg into the skin every 6 (six) months.     torsemide  (DEMADEX ) 20 MG tablet Take 1 tablet (20 mg total) by mouth daily. 30 tablet 3   No current facility-administered medications for this encounter.    Atrial Fibrillation Management history:  Previous antiarrhythmic drugs: Multaq , amiodarone  Previous cardioversions: 07/09/22, 11/03/23, 12/08/23, 02/19/24, 03/08/2024 Previous ablations: 02/03/24 Anticoagulation history: Eliquis    ROS- All systems are reviewed and negative except as per the HPI above.  Physical Exam: BP 114/84   Pulse 60   Ht 5' (1.524 m)   Wt 77.3 kg   BMI 33.28 kg/m   GEN- The patient  is well appearing, alert and oriented x 3 today.   Neck - no JVD or carotid bruit noted Lungs- Clear to ausculation bilaterally, normal work of breathing Heart- Regular rate and rhythm, no murmurs, rubs or gallops, PMI not laterally displaced Extremities- no clubbing, cyanosis, or edema Skin - no rash or ecchymosis noted    EKG today demonstrates  EKG Interpretation Date/Time:  Thursday May 05 2024 11:21:06 EST Ventricular Rate:  60 PR Interval:  226 QRS Duration:  90 QT Interval:  436 QTC Calculation: 436 R Axis:   91  Text Interpretation: Atrial-paced rhythm with prolonged AV conduction Rightward axis Possible Anterior infarct (cited on or before 12-Apr-2024) ST & T wave abnormality, consider inferolateral ischemia Abnormal ECG When compared with ECG of 12-Apr-2024 07:20, Premature atrial complexes are no longer Present Confirmed by Terra Pac ([id number]) on 05/05/2024 11:22:18 AM     Echo 04/18/24: 1.  Left ventricular ejection fraction, by estimation, is 55 to 60%. Left  ventricular ejection fraction by 3D volume is 55 %. The left ventricle has  normal function. The left ventricle has no regional wall motion  abnormalities. Elevated left atrial  pressure.   2. Right ventricular systolic function is normal. The right ventricular  size is normal. Tricuspid regurgitation signal is inadequate for assessing  PA pressure.   3. Left atrial size was severely dilated.   4. Right atrial size was mildly dilated.   5. The mitral valve has been repaired/replaced. No evidence of mitral  valve regurgitation. The mean mitral valve gradient is 2.2 mmHg with  average heart rate of 60 bpm. There is a 30 mm prosthetic annuloplasty  ring present in the mitral position.   6. The aortic valve is tricuspid. Aortic valve regurgitation is not  visualized. Aortic valve sclerosis is present, with no evidence of aortic  valve stenosis.   7. The inferior vena cava is normal in size with greater than  50%  respiratory variability, suggesting right atrial pressure of 3 mmHg.    CHA2DS2-VASc Score = 4  The patient's score is based upon: CHF History: 0 HTN History: 1 Diabetes History: 0 Stroke History: 0 Vascular Disease History: 1 Age Score: 1 Gender Score: 1       ASSESSMENT AND PLAN: Longstanding Persistent Atrial Fibrillation/atrial flutter (ICD10:  I48.11) The patient's CHA2DS2-VASc score is 4, indicating a 4.8% annual risk of stroke.   S/p [facility]ZE [facility] Patient is s/p afib and flutter ablation 02/03/24. S/p successful DCCV on 02/19/24.  S/p successful DCCV on 03/08/2024.  Patient is currently in A paced rhythm. After discussion, will recommend to patient to transition to amiodarone  200 mg once daily.  I normally stop amiodarone  at the 90-day ablation follow-up.  However, patient has had such marked fluid overload while in A-fib that I am hesitant to discontinue it at this time.  Her metolazone  was just stopped due to worsening renal function so I worry that if I stop her amiodarone  today it might lead her to go back into A-fib and she is off the diuretic that was helping her significantly.  She can discuss at follow-up appointment with Dr. Inocencio about stopping amiodarone  at that time.  Secondary Hypercoagulable State (ICD10:  D68.69) The patient is at significant risk for stroke/thromboembolism based upon her CHA2DS2-VASc Score of 4.  Continue Apixaban  (Eliquis ).  No missed doses.   High risk medication monitoring (ICD10: [id number]) Patient requires ongoing monitoring for anti-arrhythmic medication which has the potential to cause life threatening arrhythmias or AV block. Qtc stable. Transition to amiodarone  200 mg once daily.   HTN Stable today.   Acute HFpEF EF 55-60%. Patient was just recommended to stop metolazone  after Bmet showed worsening renal function.    Follow up with Dr. Inocencio in 3 months.   Dorn Heinrich, PA-C Afib Clinic [facility] [address] [place], [place] [address] [phone]  "

## 2024-05-12 ENCOUNTER — Ambulatory Visit (HOSPITAL_COMMUNITY)
Admission: RE | Admit: 2024-05-12 | Discharge: 2024-05-12 | Disposition: A | Discharge status: Home / Self Care | Source: Ambulatory Visit | Attending: Internal Medicine | Admitting: Internal Medicine

## 2024-05-12 DIAGNOSIS — I5032 Chronic diastolic (congestive) heart failure: Secondary | ICD-10-CM | POA: Diagnosis present

## 2024-05-12 LAB — BASIC METABOLIC PANEL WITH GFR
Anion gap: 17 — ABNORMAL HIGH (ref 5–15)
BUN: 87 mg/dL — ABNORMAL HIGH (ref 8–23)
CO2: 29 mmol/L (ref 22–32)
Calcium: 9 mg/dL (ref 8.9–10.3)
Chloride: 99 mmol/L (ref 98–111)
Creatinine, Ser: 3.74 mg/dL — ABNORMAL HIGH (ref 0.44–1.00)
GFR, Estimated: 12 mL/min — ABNORMAL LOW
Glucose, Bld: 108 mg/dL — ABNORMAL HIGH (ref 70–99)
Potassium: 3.2 mmol/L — ABNORMAL LOW (ref 3.5–5.1)
Sodium: 145 mmol/L (ref 135–145)

## 2024-05-13 ENCOUNTER — Ambulatory Visit (HOSPITAL_COMMUNITY): Payer: Self-pay | Admitting: Family Medicine

## 2024-05-13 MED ORDER — POTASSIUM CHLORIDE ER 20 MEQ PO TBCR: 40.0000 meq | EXTENDED_RELEASE_TABLET | Freq: Two times a day (BID) | ORAL | Status: AC

## 2024-05-17 ENCOUNTER — Ambulatory Visit: Payer: Medicare PPO

## 2024-05-17 DIAGNOSIS — I495 Sick sinus syndrome: Secondary | ICD-10-CM

## 2024-05-17 LAB — CUP PACEART REMOTE DEVICE CHECK
Battery Remaining Longevity: 73 mo
Battery Voltage: 2.97 V
Brady Statistic AP VP Percent: 0.44 %
Brady Statistic AP VS Percent: 99.06 %
Brady Statistic AS VP Percent: 0.01 %
Brady Statistic AS VS Percent: 0.48 %
Brady Statistic RA Percent Paced: 99.47 %
Brady Statistic RV Percent Paced: 0.46 %
Date Time Interrogation Session: 20260127023021
Implantable Lead Connection Status: 753985
Implantable Lead Connection Status: 753985
Implantable Lead Implant Date: 20191127
Implantable Lead Implant Date: 20191127
Implantable Lead Location: 753859
Implantable Lead Location: 753860
Implantable Lead Model: 5076
Implantable Lead Model: 5076
Implantable Pulse Generator Implant Date: 20191127
Lead Channel Impedance Value: 304 Ohm
Lead Channel Impedance Value: 323 Ohm
Lead Channel Impedance Value: 323 Ohm
Lead Channel Impedance Value: 342 Ohm
Lead Channel Pacing Threshold Amplitude: 0.625 V
Lead Channel Pacing Threshold Amplitude: 0.625 V
Lead Channel Pacing Threshold Pulse Width: 0.4 ms
Lead Channel Pacing Threshold Pulse Width: 0.4 ms
Lead Channel Sensing Intrinsic Amplitude: 1.875 mV
Lead Channel Sensing Intrinsic Amplitude: 1.875 mV
Lead Channel Sensing Intrinsic Amplitude: 5.75 mV
Lead Channel Sensing Intrinsic Amplitude: 5.75 mV
Lead Channel Setting Pacing Amplitude: 1.5 V
Lead Channel Setting Pacing Amplitude: 1.5 V
Lead Channel Setting Pacing Pulse Width: 0.4 ms
Lead Channel Setting Sensing Sensitivity: 0.9 mV
Zone Setting Status: 755011
Zone Setting Status: 755011

## 2024-05-19 ENCOUNTER — Ambulatory Visit: Payer: Self-pay | Admitting: Cardiology

## 2024-05-19 NOTE — Progress Notes (Signed)
 Isabel Hardy                                          MRN: [id number]   05/19/2024   The [facility] Quality Team Specialist reviewed this patient medical record for the purposes of chart review for care gap closure. The following were reviewed: erroneous encounter.    [facility]BCI Quality Team

## 2024-05-25 NOTE — Progress Notes (Signed)
 Remote PPM Transmission
